# Patient Record
Sex: Male | Born: 1970
Health system: Southern US, Community
[De-identification: ages and names within clinical notes are randomized; demographics above are authoritative.]

## PROBLEM LIST (undated history)

## (undated) DIAGNOSIS — I10 Essential (primary) hypertension: Secondary | ICD-10-CM

## (undated) DIAGNOSIS — E785 Hyperlipidemia, unspecified: Secondary | ICD-10-CM

## (undated) DIAGNOSIS — E291 Testicular hypofunction: Secondary | ICD-10-CM

## (undated) DIAGNOSIS — G4733 Obstructive sleep apnea (adult) (pediatric): Principal | ICD-10-CM

## (undated) DIAGNOSIS — B019 Varicella without complication: Secondary | ICD-10-CM

## (undated) DIAGNOSIS — R739 Hyperglycemia, unspecified: Secondary | ICD-10-CM

## (undated) DIAGNOSIS — T63441A Toxic effect of venom of bees, accidental (unintentional), initial encounter: Secondary | ICD-10-CM

## (undated) HISTORY — PX: HAND SURGERY: SHX662

## (undated) HISTORY — DX: Testicular hypofunction: E29.1

## (undated) HISTORY — DX: Hyperlipidemia, unspecified: E78.5

## (undated) HISTORY — DX: Essential (primary) hypertension: I10

## (undated) HISTORY — DX: Toxic effect of venom of bees, accidental (unintentional), initial encounter: T63.441A

## (undated) HISTORY — DX: Varicella without complication: B01.9

## (undated) HISTORY — DX: Hyperglycemia, unspecified: R73.9

## (undated) HISTORY — DX: Obstructive sleep apnea (adult) (pediatric): G47.33

---

## 2006-12-19 ENCOUNTER — Ambulatory Visit: Payer: Self-pay | Admitting: Internal Medicine

## 2006-12-19 DIAGNOSIS — I428 Other cardiomyopathies: Secondary | ICD-10-CM | POA: Insufficient documentation

## 2006-12-20 ENCOUNTER — Encounter: Payer: Self-pay | Admitting: Internal Medicine

## 2006-12-20 DIAGNOSIS — E785 Hyperlipidemia, unspecified: Secondary | ICD-10-CM | POA: Insufficient documentation

## 2006-12-20 DIAGNOSIS — I10 Essential (primary) hypertension: Secondary | ICD-10-CM | POA: Insufficient documentation

## 2006-12-20 DIAGNOSIS — M109 Gout, unspecified: Secondary | ICD-10-CM | POA: Insufficient documentation

## 2006-12-23 ENCOUNTER — Ambulatory Visit: Payer: Self-pay | Admitting: Internal Medicine

## 2006-12-23 LAB — CONVERTED CEMR LAB
ALT: 30 units/L (ref 0–53)
AST: 33 units/L (ref 0–37)
Albumin: 4 g/dL (ref 3.5–5.2)
Alkaline Phosphatase: 56 units/L (ref 39–117)
BUN: 11 mg/dL (ref 6–23)
Basophils Absolute: 0 10*3/uL (ref 0.0–0.1)
Basophils Relative: 0.5 % (ref 0.0–1.0)
Bilirubin, Direct: 0.1 mg/dL (ref 0.0–0.3)
CO2: 27 meq/L (ref 19–32)
Calcium: 9.1 mg/dL (ref 8.4–10.5)
Chloride: 108 meq/L (ref 96–112)
Cholesterol: 253 mg/dL (ref 0–200)
Creatinine, Ser: 1.1 mg/dL (ref 0.4–1.5)
Direct LDL: 193.1 mg/dL
Eosinophils Absolute: 0.1 10*3/uL (ref 0.0–0.6)
Eosinophils Relative: 2.2 % (ref 0.0–5.0)
GFR calc Af Amer: 97 mL/min
GFR calc non Af Amer: 81 mL/min
Glucose, Bld: 101 mg/dL — ABNORMAL HIGH (ref 70–99)
HCT: 43.1 % (ref 39.0–52.0)
HDL: 43.5 mg/dL (ref >39.0)
Hemoglobin: 14.3 g/dL (ref 13.0–17.0)
Lymphocytes Relative: 40.2 % (ref 12.0–46.0)
MCHC: 33.3 g/dL (ref 30.0–36.0)
MCV: 80.9 fL (ref 78.0–100.0)
Monocytes Absolute: 0.3 10*3/uL (ref 0.2–0.7)
Monocytes Relative: 5.6 % (ref 3.0–11.0)
Neutro Abs: 2.6 10*3/uL (ref 1.4–7.7)
Neutrophils Relative %: 51.5 % (ref 43.0–77.0)
Platelets: 224 10*3/uL (ref 150–400)
Potassium: 3.6 meq/L (ref 3.5–5.1)
RBC: 5.32 M/uL (ref 4.22–5.81)
RDW: 13.6 % (ref 11.5–14.6)
Sodium: 142 meq/L (ref 135–145)
TSH: 1.08 microintl units/mL (ref 0.35–5.50)
Total Bilirubin: 0.5 mg/dL (ref 0.3–1.2)
Total CHOL/HDL Ratio: 5.8
Total Protein: 7.6 g/dL (ref 6.0–8.3)
Triglycerides: 92 mg/dL (ref 0–149)
Uric Acid, Serum: 5.1 mg/dL (ref 2.4–7.0)
VLDL: 18 mg/dL (ref 0–40)
WBC: 5.1 10*3/uL (ref 4.5–10.5)

## 2007-04-15 ENCOUNTER — Ambulatory Visit: Payer: Self-pay | Admitting: Internal Medicine

## 2007-04-15 DIAGNOSIS — F528 Other sexual dysfunction not due to a substance or known physiological condition: Secondary | ICD-10-CM

## 2007-04-15 DIAGNOSIS — J309 Allergic rhinitis, unspecified: Secondary | ICD-10-CM | POA: Insufficient documentation

## 2007-06-05 ENCOUNTER — Ambulatory Visit: Payer: Self-pay | Admitting: Internal Medicine

## 2007-06-05 LAB — CONVERTED CEMR LAB
ALT: 25 units/L (ref 0–53)
AST: 32 units/L (ref 0–37)
Cholesterol: 208 mg/dL (ref 0–200)
Direct LDL: 150.7 mg/dL
HDL: 42.9 mg/dL (ref >39.0)
Total CHOL/HDL Ratio: 4.8
Triglycerides: 96 mg/dL (ref 0–149)
VLDL: 19 mg/dL (ref 0–40)

## 2007-06-09 ENCOUNTER — Encounter: Payer: Self-pay | Admitting: Internal Medicine

## 2007-12-31 ENCOUNTER — Ambulatory Visit: Payer: Self-pay | Admitting: Internal Medicine

## 2007-12-31 DIAGNOSIS — D179 Benign lipomatous neoplasm, unspecified: Secondary | ICD-10-CM | POA: Insufficient documentation

## 2008-03-14 ENCOUNTER — Encounter: Payer: Self-pay | Admitting: Internal Medicine

## 2008-04-21 ENCOUNTER — Encounter: Admission: RE | Admit: 2008-04-21 | Discharge: 2008-04-21 | Payer: Self-pay | Admitting: Orthopedic Surgery

## 2008-04-27 ENCOUNTER — Encounter: Payer: Self-pay | Admitting: Internal Medicine

## 2008-05-16 ENCOUNTER — Encounter: Payer: Self-pay | Admitting: Internal Medicine

## 2008-06-06 ENCOUNTER — Telehealth: Payer: Self-pay | Admitting: Internal Medicine

## 2008-06-23 ENCOUNTER — Encounter: Payer: Self-pay | Admitting: Internal Medicine

## 2008-09-26 ENCOUNTER — Encounter: Payer: Self-pay | Admitting: Internal Medicine

## 2008-09-29 ENCOUNTER — Telehealth: Payer: Self-pay | Admitting: Internal Medicine

## 2008-10-20 ENCOUNTER — Ambulatory Visit (HOSPITAL_COMMUNITY): Admission: RE | Admit: 2008-10-20 | Discharge: 2008-10-20 | Payer: Self-pay | Admitting: Orthopedic Surgery

## 2008-10-24 ENCOUNTER — Encounter: Payer: Self-pay | Admitting: Internal Medicine

## 2008-11-09 ENCOUNTER — Ambulatory Visit (HOSPITAL_BASED_OUTPATIENT_CLINIC_OR_DEPARTMENT_OTHER): Admission: RE | Admit: 2008-11-09 | Discharge: 2008-11-09 | Payer: Self-pay | Admitting: Orthopedic Surgery

## 2008-11-17 ENCOUNTER — Encounter: Payer: Self-pay | Admitting: Internal Medicine

## 2008-11-24 ENCOUNTER — Encounter: Payer: Self-pay | Admitting: Internal Medicine

## 2008-12-22 ENCOUNTER — Encounter: Payer: Self-pay | Admitting: Internal Medicine

## 2009-01-12 ENCOUNTER — Encounter: Payer: Self-pay | Admitting: Internal Medicine

## 2009-02-02 ENCOUNTER — Encounter: Payer: Self-pay | Admitting: Internal Medicine

## 2009-02-28 ENCOUNTER — Ambulatory Visit: Payer: Self-pay | Admitting: Internal Medicine

## 2009-02-28 ENCOUNTER — Encounter (INDEPENDENT_AMBULATORY_CARE_PROVIDER_SITE_OTHER): Payer: Self-pay | Admitting: *Deleted

## 2009-02-28 DIAGNOSIS — L259 Unspecified contact dermatitis, unspecified cause: Secondary | ICD-10-CM

## 2009-04-17 ENCOUNTER — Emergency Department (HOSPITAL_COMMUNITY): Admission: EM | Admit: 2009-04-17 | Discharge: 2009-04-17 | Payer: Self-pay | Admitting: Emergency Medicine

## 2009-04-27 ENCOUNTER — Ambulatory Visit: Payer: Self-pay | Admitting: Internal Medicine

## 2009-04-27 LAB — CONVERTED CEMR LAB
ALT: 25 units/L (ref 0–53)
CO2: 28 meq/L (ref 19–32)
Calcium: 8.9 mg/dL (ref 8.4–10.5)
HDL: 50.2 mg/dL (ref >39.00)
Hgb A1c MFr Bld: 6.1 % (ref 4.6–6.5)
Sodium: 143 meq/L (ref 135–145)
TSH: 1.16 microintl units/mL (ref 0.35–5.50)
Total Bilirubin: 0.5 mg/dL (ref 0.3–1.2)
Total Protein: 7.6 g/dL (ref 6.0–8.3)
Triglycerides: 111 mg/dL (ref 0.0–149.0)

## 2009-05-04 ENCOUNTER — Ambulatory Visit: Payer: Self-pay | Admitting: Internal Medicine

## 2009-05-04 DIAGNOSIS — R7309 Other abnormal glucose: Secondary | ICD-10-CM

## 2009-07-17 ENCOUNTER — Ambulatory Visit: Payer: Self-pay | Admitting: Family

## 2009-07-17 ENCOUNTER — Encounter: Payer: Self-pay | Admitting: Family

## 2009-07-17 DIAGNOSIS — H6690 Otitis media, unspecified, unspecified ear: Secondary | ICD-10-CM | POA: Insufficient documentation

## 2009-07-20 ENCOUNTER — Encounter: Payer: Self-pay | Admitting: Internal Medicine

## 2009-09-19 ENCOUNTER — Telehealth: Payer: Self-pay | Admitting: Internal Medicine

## 2009-11-02 ENCOUNTER — Ambulatory Visit: Payer: Self-pay | Admitting: Internal Medicine

## 2009-11-02 LAB — CONVERTED CEMR LAB
Direct LDL: 175.7 mg/dL
Testosterone: 240.15 ng/dL — ABNORMAL LOW (ref 350.00–890.00)
Triglycerides: 102 mg/dL (ref 0.0–149.0)

## 2009-11-03 ENCOUNTER — Encounter: Payer: Self-pay | Admitting: Internal Medicine

## 2009-11-03 LAB — CONVERTED CEMR LAB
Sex Hormone Binding: 25 nmol/L (ref 13–71)
Testosterone Free: 56.8 pg/mL (ref 47.0–244.0)
Testosterone: 251.89 ng/dL — ABNORMAL LOW (ref 350–890)

## 2009-11-09 ENCOUNTER — Ambulatory Visit: Payer: Self-pay | Admitting: Internal Medicine

## 2009-11-09 DIAGNOSIS — E291 Testicular hypofunction: Secondary | ICD-10-CM

## 2009-11-13 ENCOUNTER — Encounter (INDEPENDENT_AMBULATORY_CARE_PROVIDER_SITE_OTHER): Payer: Self-pay | Admitting: *Deleted

## 2010-03-16 ENCOUNTER — Other Ambulatory Visit: Payer: Self-pay | Admitting: Internal Medicine

## 2010-03-16 ENCOUNTER — Ambulatory Visit
Admission: RE | Admit: 2010-03-16 | Discharge: 2010-03-16 | Payer: Self-pay | Source: Home / Self Care | Attending: Internal Medicine | Admitting: Internal Medicine

## 2010-03-16 LAB — HEPATIC FUNCTION PANEL
ALT: 26 U/L (ref 0–53)
AST: 30 U/L (ref 0–37)
Albumin: 3.9 g/dL (ref 3.5–5.2)
Alkaline Phosphatase: 55 U/L (ref 39–117)
Bilirubin, Direct: 0.1 mg/dL (ref 0.0–0.3)
Total Bilirubin: 0.7 mg/dL (ref 0.3–1.2)
Total Protein: 7.2 g/dL (ref 6.0–8.3)

## 2010-03-16 LAB — LIPID PANEL
Cholesterol: 154 mg/dL (ref 0–200)
HDL: 46.8 mg/dL (ref >39.00)
LDL Cholesterol: 95 mg/dL (ref 0–99)
Total CHOL/HDL Ratio: 3
Triglycerides: 61 mg/dL (ref 0.0–149.0)
VLDL: 12.2 mg/dL (ref 0.0–40.0)

## 2010-03-16 LAB — HEMOGLOBIN A1C: Hgb A1c MFr Bld: 6.5 % (ref 4.6–6.5)

## 2010-03-23 ENCOUNTER — Ambulatory Visit
Admission: RE | Admit: 2010-03-23 | Discharge: 2010-03-23 | Payer: Self-pay | Source: Home / Self Care | Attending: Internal Medicine | Admitting: Internal Medicine

## 2010-04-05 NOTE — Assessment & Plan Note (Signed)
Summary: 6 month follow up/mhf   Vital Signs:  Patient profile:   40 year old male Height:      69 inches Weight:      218.50 pounds BMI:     32.38 O2 Sat:      98 % on Room air Temp:     98.3 degrees F oral Pulse rate:   93 / minute Pulse rhythm:   regular Resp:     18 per minute BP sitting:   136 / 90  (right arm) Cuff size:   large  Vitals Entered By: [name] CMA (November 09, 2009 3:38 PM)  O2 Flow:  Room air CC: 6 month follow up  Is Patient Diabetic? No Pain Assessment Patient in pain? no      Comments no concerns   Primary Care [name]:  DThomos Lemons DO  CC:  6 month follow up .  History of Present Illness: 40 y/o AA male with hx of htn and ED for f/u still having intemittent ED reviewed recent testosterone blood test he denies headaches  Testosterone, Total  [L]  251.89 ng/dL                [id number]                Tanner Stage       Male              Male              I              < 30 ng/dL        < 10 ng/dL              II             < 150 ng/dL       < 30 ng/dL              III            100-320 ng/dL     < 35 ng/dL              IV             200-970 ng/dL     95-62 ng/dL              V              350-890 ng/dL     13-08 ng/dL         Sex Hormone Binding Globulin                             25 nmol/L                   13-71   Testosterone, Free        56.8 pg/mL                  47.0-244.0           The concentration of free testosterone is derived from a mathematical     expression based on constants for the binding of testosterone to sex     hormone-binding globulin and albumin.         Testosterone, % Free      2.3 %  1.6-2.9  Preventive Screening-Counseling & Management  Alcohol-Tobacco     Smoking Status: never  Allergies (verified): No Known Drug Allergies  Past History:  Past Medical History: Hyperlipidemia Hypertension  Cardiomyopathy Gout      Family History: Family History of CAD  Male 1st degree relative <60 Family History Diabetes 1st degree relative Family History High cholesterol Family History Hypertension       Social History: Married - 3 children Never Smoked  Alcohol use-yes  (social)  Occupation:  works for [employer]    Physical Exam  General:  alert, well-developed, and well-nourished.   Lungs:  Normal respiratory effort, chest expands symmetrically. Lungs are clear to auscultation, no crackles or wheezes. Heart:  Normal rate and regular rhythm. S1 and S2 normal without gallop, murmur, click, rub or other extra sounds. Extremities:  No lower extremity edema    Impression & Recommendations:  Problem # 1:  HYPOGONADISM (ICD-257.2) Pt with ED and low testosterone level.  refer to endo for further work up and possible tx  Orders: Endocrinology Referral (Endocrine)  Problem # 2:  HYPERTENSION (ICD-401.9) Assessment: Improved  His updated medication list for this problem includes:    Lisinopril 10 Mg Tabs (Lisinopril) ..... One by mouth qd    Amlodipine Besylate 10 Mg Tabs (Amlodipine besylate) ..... One tablet by mouth daily  BP today: 136/90 Prior BP: 150/100 (07/17/2009)  Labs Reviewed: K+: 3.8 (04/27/2009) Creat: : 1.2 (04/27/2009)   Chol: 235 (11/02/2009)   HDL: 47.80 (11/02/2009)   LDL: DEL (06/05/2007)   TG: 102.0 (11/02/2009)  Complete Medication List: 1)  Lisinopril 10 Mg Tabs (Lisinopril) .... One by mouth qd 2)  Amlodipine Besylate 10 Mg Tabs (Amlodipine besylate) .... One tablet by mouth daily 3)  Aspir-low 81 Mg Tbec (Aspirin) .... One by mouth once daily 4)  Flonase 50 Mcg/act Susp (Fluticasone propionate) .... One spray each nostril once daily 5)  Triamcinolone Acetonide 0.1 % Crea (Triamcinolone acetonide) .... Apply two times a day 6)  Crestor 20 Mg Tabs (Rosuvastatin calcium) .... One by mouth once daily  Patient Instructions: 1)  Please schedule a follow-up appointment in 4 months. 2)  Hepatic Panel prior  to visit, ICD-9:  272.4  3)  Lipid Panel prior to visit, ICD-9: 272.4 4)  HbgA1C prior to visit, ICD-9: 790.29 5)  Please return for lab work one (1) week before your next appointment.   Contraindications/Deferment of Procedures/Staging:    Test/Procedure: FLU VAX    Reason for deferment: patient declined  Prescriptions: CRESTOR 20 MG TABS (ROSUVASTATIN CALCIUM) one by mouth once daily  #30 x 5   Entered and Authorized by:   D. Thomos Lemons DO   Signed by:   D. Thomos Lemons DO on 11/09/2009   Method used:   Electronically to        [facility]  [address]* (retail)       [address]       [place], [place]  [address]       Ph: [phone]       Fax: [phone]   RxID:   [id number]   Current Allergies (reviewed today): No known allergies    Immunization History:  Influenza Immunization History:    Influenza:  declined (11/09/2009)

## 2010-04-05 NOTE — Letter (Signed)
Summary: Work Dietitian at [employer]. [address]   [place], [place] [address]   Phone: [phone]  Fax: [phone]      Today's Date: May 04, 2009    Name of Patient: Bruce Harris  The above named patient had a medical visit today at:  am / pm.  Please take this into consideration when reviewing the time away from work.    Special Instructions:  [ X ] None  [  ] To be off the remainder of today, returning to the normal work / school schedule tomorrow.  [  ] To be off until the next scheduled appointment on ______________________.  [  ] Other ________________________________________________________________ ________________________________________________________________________      Sincerely yours,   [name] CMA Dr Thomos Lemons

## 2010-04-05 NOTE — Letter (Signed)
Summary: Primary Care Consult Scheduled Letter  [place] [facility]  [address]   [place], [place] [address]   Phone: [phone]  Fax: [phone]      11/13/2009 MRN: [id number]  [facility] [address] [place], [place]  [address]    Dear Mr. Delima,      We have scheduled an appointment for you.  At the recommendation of Dr.YOO, we have scheduled you a consult with DR [facility]   on OCTOBER  26,2011 at [facility] .  Their address [address] [place]   [address] . The office phone number is _[phone].  If this appointment day and time is not convenient for you, please feel free to call the office of the doctor you are being referred to at the number listed above and reschedule the appointment.     It is important for you to keep your scheduled appointments. We are here to make sure you are given good patient care.    Thank you,  Darral Dash Patient Care Coordinator [place] at [facility]

## 2010-04-05 NOTE — Letter (Signed)
Summary: [facility]   Imported By: Lanelle Bal 08/04/2009 08:14:50  _____________________________________________________________________  External Attachment:    Type:   Image     Comment:   External Document

## 2010-04-05 NOTE — Assessment & Plan Note (Signed)
Summary: 2 month follow up/mhf, resched- jr   Vital Signs:  Patient profile:   40 year old male Weight:      227.75 pounds BMI:     33.75 O2 Sat:      97 % on Room air Temp:     98.4 degrees F oral Pulse rate:   90 / minute Pulse rhythm:   regular Resp:     18 per minute BP sitting:   126 / 90  (left arm) Cuff size:   large  Vitals Entered By: [name] CMA (May 04, 2009 3:39 PM)  O2 Flow:  Room air CC: 2 Month Follow up disease management Is Patient Diabetic? No Comments review of labs, no concerns   Primary Care Provider:  D. Thomos Lemons DO  CC:  2 Month Follow up disease management.  History of Present Illness:  Hyperlipidemia Follow-Up      This is a 40 year old man who presents for Hyperlipidemia follow-up.  The patient denies muscle aches and GI upset.  The patient denies the following symptoms: chest pain/pressure.  Compliance with medications (by patient report) has been near 100%.  Dietary compliance has been poor.    Htn - stable lab results reviewed.  Preventive Screening-Counseling & Management  Alcohol-Tobacco     Smoking Status: never  Allergies: No Known Drug Allergies  Past History:  Past Medical History: Hyperlipidemia Hypertension  Cardiomyopathy Gout    Family History: Family History of CAD Male 1st degree relative <60 Family History Diabetes 1st degree relative Family History High cholesterol Family History Hypertension      Social History: Married - 3 children Never Smoked  Alcohol use-yes  (social) Occupation:  works for [employer]    Physical Exam  General:  alert and overweight-appearing.   Eyes:  pupils equal, pupils round, and pupils reactive to light.   Neck:  No deformities, masses, or tenderness noted.no carotid bruits.   Lungs:  normal respiratory effort and normal breath sounds.   Heart:  normal rate, regular rhythm, and no gallop.   Abdomen:  soft and non-tender.   Extremities:  No lower extremity  edema    Impression & Recommendations:  Problem # 1:  HYPERLIPIDEMIA (ICD-272.4) Poor dietary compliance.   we discussed decreasing/avoiding saturated fats.  His updated medication list for this problem includes:    Simvastatin 40 Mg Tabs (Simvastatin) ..... One by mouth qpm  Labs Reviewed: SGOT: 28 (04/27/2009)   SGPT: 25 (04/27/2009)   HDL:50.20 (04/27/2009), 42.9 (06/05/2007)  LDL:DEL (06/05/2007), DEL (12/23/2006)  Chol:218 (04/27/2009), 208 (06/05/2007)  Trig:111.0 (04/27/2009), 96 (06/05/2007)  Problem # 2:  HYPERTENSION (ICD-401.9) Assessment: Improved Good response.  Maintain current medication regimen.  His updated medication list for this problem includes:    Lisinopril 10 Mg Tabs (Lisinopril) ..... One by mouth qd    Amlodipine Besylate 5 Mg Tabs (Amlodipine besylate) ..... One by mouth once daily  BP today: 126/90 Prior BP: 150/108 (02/28/2009)  Labs Reviewed: K+: 3.8 (04/27/2009) Creat: : 1.2 (04/27/2009)   Chol: 218 (04/27/2009)   HDL: 50.20 (04/27/2009)   LDL: DEL (06/05/2007)   TG: 111.0 (04/27/2009)  Problem # 3:  HYPERGLYCEMIA (ICD-790.29) dietary / lifestyle changes reviewed Labs Reviewed: Creat: 1.2 (04/27/2009)     Complete Medication List: 1)  Lisinopril 10 Mg Tabs (Lisinopril) .... One by mouth qd 2)  Amlodipine Besylate 5 Mg Tabs (Amlodipine besylate) .... One by mouth once daily 3)  Aspir-low 81 Mg Tbec (Aspirin) .Marland KitchenMarland KitchenMarland Kitchen  One by mouth once daily 4)  Simvastatin 40 Mg Tabs (Simvastatin) .... One by mouth qpm 5)  Viagra 100 Mg Tabs (Sildenafil citrate) .... 1/2 to one tab by mouth once daily as needed 6)  Flonase 50 Mcg/act Susp (Fluticasone propionate) .... One spray each nostril once daily 7)  Triamcinolone Acetonide 0.1 % Crea (Triamcinolone acetonide) .... Apply two times a day  Patient Instructions: 1)  Please schedule a follow-up appointment in 6 months. 2)  Lipid Panel prior to visit, ICD-9: 272.4 3)  Please return for lab work one (1) week  before your next appointment.  Prescriptions: SIMVASTATIN 40 MG TABS (SIMVASTATIN) one by mouth qpm  #90 x 1   Entered and Authorized by:   D. Thomos Lemons DO   Signed by:   D. Thomos Lemons DO on 05/04/2009   Method used:   Electronically to        [facility]  [address]* (retail)       [address]       [place], [place]  [address]       Ph: [phone]       Fax: [phone]   RxID:   [id number] AMLODIPINE BESYLATE 5 MG  TABS (AMLODIPINE BESYLATE) one by mouth once daily  #90 x 1   Entered and Authorized by:   D. Thomos Lemons DO   Signed by:   D. Thomos Lemons DO on 05/04/2009   Method used:   Electronically to        [facility]  [address]* (retail)       [address]       [place], [place]  [address]       Ph: [phone]       Fax: [phone]   RxID:   [id number] LISINOPRIL 10 MG  TABS (LISINOPRIL) one by mouth qd  #90 x 1   Entered and Authorized by:   D. Thomos Lemons DO   Signed by:   D. Thomos Lemons DO on 05/04/2009   Method used:   Electronically to        [facility]  [address]* (retail)       [address]       [place], [place]  [address]       Ph: [phone]       Fax: [phone]   RxID:   [id number]    Immunization History:  Influenza Immunization History:    Influenza:  declined (05/04/2009)

## 2010-04-05 NOTE — Progress Notes (Signed)
Summary: Lab orders  Phone Note Call from Patient Call back at Home Phone [phone]   Caller: Patient Summary of Call: Pt requesting testorone added to his lab orders  Initial call taken by: Lannette Donath,  September 19, 2009 4:45 PM  Follow-up for Phone Call        ok to add total testosterone - use ED code Follow-up by: D. Thomos Lemons DO,  September 20, 2009 12:20 PM  Additional Follow-up for Phone Call Additional follow up Details #1::        call returned to patient. He was advised test will be added to lab draw Additional Follow-up by: [name] CMA,  September 21, 2009 11:38 AM

## 2010-04-05 NOTE — Assessment & Plan Note (Signed)
Summary: HA/EAR/BALANCE PROBLEM/HEA   Vital Signs:  Patient profile:   40 year old male Height:      69 inches Weight:      219 pounds BMI:     32.46 Temp:     98.4 degrees F oral Pulse rate:   96 / minute Pulse rhythm:   regular Resp:     16 per minute BP sitting:   150 / 100  (right arm) Cuff size:   large  Vitals Entered By: Bruce Harris CMA (Jul 17, 2009 3:07 PM) CC: room 5  Bilateral ear pain, head pressure, feels off balance, blurry vision intermittent x 2 weeks but now increasing. Is Patient Diabetic? No   Primary Care Provider:  Dondra Spry DO  CC:  room 5  Bilateral ear pain, head pressure, feels off balance, and blurry vision intermittent x 2 weeks but now increasing.Marland Kitchen  History of Present Illness: Bruce Harris is a 40 year old male who presents today with complaint of 2 week history of bilatera; ear pain/pressure.  "Feels like a vice grip".  this is associated with balance problems and occasionally a some blurred vision when pain is severe.  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     Allergies (verified): No Known Drug Allergies  Physical Exam  General:  Well-developed,well-nourished,in no acute distress; alert,appropriate and cooperative throughout examination Head:  Normocephalic and atraumatic without obvious abnormalities. No apparent alopecia or balding. Eyes:  PERRLA Ears:  Right TM is erythematous and bulging,  L TM suspect serous effusion, but no erythema and no bulging.   Mouth:  Oral mucosa and oropharynx without lesions or exudates.  Teeth in good repair. Neck:  Thick, no notable masses of Lymphadenopathy Lungs:  Normal respiratory effort, chest expands symmetrically. Lungs are clear to auscultation, no crackles or wheezes. Heart:  Normal rate and regular rhythm. S1 and S2 normal without gallop, murmur, click, rub or other extra sounds.   Impression & Recommendations:  Problem # 1:  OTITIS MEDIA, RIGHT  (ICD-382.9) Assessment New Will treat with amoxicillin.  This is likely cause for dizziness.  Pt given note for work excusing him for the next few days.  Pt instructed to follow up if symtoms worsen or do not improve. His updated medication list for this problem includes:    Aspir-low 81 Mg Tbec (Aspirin) ..... One by mouth once daily    Amoxicillin 500 Mg Cap (Amoxicillin) .Marland Kitchen... Take 1 capsule by mouth three times a day x 10 days  Problem # 2:  HYPERTENSION (ICD-401.9) BP is 150/100.  Pt reports compliance with meds.  Will increase amlodipine to 10mg .  Patient instructed to follow up in 1 month with Dr. Artist Pais.  Verbalizes understanding. His updated medication list for this problem includes:    Lisinopril 10 Mg Tabs (Lisinopril) ..... One by mouth qd    Amlodipine Besylate 10 Mg Tabs (Amlodipine besylate) ..... One tablet by mouth daily  BP today: 150/100 Prior BP: 126/90 (05/04/2009)  Labs Reviewed: K+: 3.8 (04/27/2009) Creat: : 1.2 (04/27/2009)   Chol: 218 (04/27/2009)   HDL: 50.20 (04/27/2009)   LDL: DEL (06/05/2007)   TG: 111.0 (04/27/2009)  Complete Medication List: 1)  Lisinopril 10 Mg Tabs (Lisinopril) .... One by mouth qd 2)  Amlodipine Besylate 10 Mg Tabs (Amlodipine besylate) .... One tablet by mouth daily 3)  Aspir-low 81 Mg Tbec (Aspirin) .... One by mouth once daily 4)  Simvastatin 40 Mg Tabs (Simvastatin) .... One by mouth  qpm 5)  Flonase 50 Mcg/act Susp (Fluticasone propionate) .... One spray each nostril once daily 6)  Triamcinolone Acetonide 0.1 % Crea (Triamcinolone acetonide) .... Apply two times a day 7)  Amoxicillin 500 Mg Cap (Amoxicillin) .... Take 1 capsule by mouth three times a day x 10 days  Patient Instructions: 1)  Call if symptoms worsen, or do not improve.  Prescriptions: AMLODIPINE BESYLATE 10 MG TABS (AMLODIPINE BESYLATE) one tablet by mouth daily  #30 x 1   Entered and Authorized by:   Lemont Fillers FNP   Signed by:   Lemont Fillers FNP  on 07/17/2009   Method used:   Electronically to        [facility] [phone]* (retail)       [address]       [place], [place]  [address]       Ph: [phone]       Fax: [phone]   RxID:   [id number] AMOXICILLIN 500 MG CAP (AMOXICILLIN) Take 1 capsule by mouth three times a day X 10 days  #30 x 0   Entered and Authorized by:   Lemont Fillers FNP   Signed by:   Lemont Fillers FNP on 07/17/2009   Method used:   Electronically to        [facility] [phone]* (retail)       [address]       [place], [place]  [address]       Ph: [phone]       Fax: [phone]   RxID:   [id number]   Current Allergies (reviewed today): No known allergies

## 2010-04-05 NOTE — Letter (Signed)
Summary: Out of Work  Adult nurse at [employer]. [address]   [place], [place] [address]   Phone: [phone]  Fax: [phone]    Jul 17, 2009   Employee:  Allen R Seever    To Whom It May Concern:   For Medical reasons, please excuse the above named employee from work for the following dates:  Start:   07/17/09  End:   07/20/09  If you need additional information, please feel free to contact our office.         Sincerely,    Lemont Fillers FNP

## 2010-04-11 NOTE — Assessment & Plan Note (Signed)
Summary: 4 MONTH FOLLOW UP/MHF   Vital Signs:  Patient profile:   [name] Height:      69 inches Weight:      220.50 pounds BMI:     32.68 O2 Sat:      99 % on Room air Temp:     98.2 degrees F oral Pulse rate:   78 / minute Resp:     18 per minute BP sitting:   124 / 80  (right arm) Cuff size:   large  Vitals Entered By: [name] CMA (March 23, 2010 10:02 AM)  O2 Flow:  Room air CC: 4 Month Follow up  Is Patient Diabetic? No Pain Assessment Patient in pain? no        Primary Care Provider:  Dondra Spry DO  CC:  4 Month Follow up .  History of Present Illness: 40 y/o Harris for f/u pt unable to obtain appt with endo re:  hypogonadism  however,  he reports fatigue and ED symptoms improved  htn - tolerating meds well  hyperlipidemia - no adverse effects from crestor noted   Preventive Screening-Counseling & Management  Alcohol-Tobacco     Smoking Status: never  Allergies (verified): No Known Drug Allergies  Past History:  Past Medical History: Hyperlipidemia Hypertension  Cardiomyopathy  Gout      Social History: Married - 3 children Never Smoked  Alcohol use-yes  (social)   Occupation:  works for [employer]    Review of Systems  The patient denies chest pain.         no dizziness  Physical Exam  General:  alert, well-developed, and well-nourished.   Head:  normocephalic and atraumatic.   Lungs:  normal respiratory effort and normal breath sounds.   Heart:  normal rate, regular rhythm, no murmur, and no gallop.   Extremities:  No lower extremity edema    Impression & Recommendations:  Problem # 1:  ERECTILE DYSFUNCTION (ICD-302.72) Assessment Improved  Problem # 2:  HYPERTENSION (ICD-401.9) Assessment: Improved  His updated medication list for this problem includes:    Lisinopril 10 Mg Tabs (Lisinopril) ..... One by mouth qd    Amlodipine Besylate 10 Mg Tabs (Amlodipine besylate) ..... One tablet by  mouth daily  Future Orders: T-Basic Metabolic Panel [id number]) ... 06/18/2010  BP today: 124/80 Prior BP: 136/90 (11/09/2009)  Labs Reviewed: K+: 3.8 (04/27/2009) Creat: : 1.2 (04/27/2009)   Chol: 154 (03/16/2010)   HDL: 46.80 (03/16/2010)   LDL: 95 (03/16/2010)   TG: 61.0 (03/16/2010)  Problem # 3:  HYPERLIPIDEMIA (ICD-272.4) Assessment: Improved  His updated medication list for this problem includes:    Crestor 20 Mg Tabs (Rosuvastatin calcium) ..... One by mouth once daily  Labs Reviewed: SGOT: 30 (03/16/2010)   SGPT: 26 (03/16/2010)   HDL:46.80 (03/16/2010), 47.80 (11/02/2009)  LDL:95 (03/16/2010), DEL (82/95/6213)  Chol:154 (03/16/2010), 235 (11/02/2009)  Trig:61.0 (03/16/2010), 102.0 (11/02/2009)  Complete Medication List: 1)  Lisinopril 10 Mg Tabs (Lisinopril) .... One by mouth qd 2)  Amlodipine Besylate 10 Mg Tabs (Amlodipine besylate) .... One tablet by mouth daily 3)  Aspir-low 81 Mg Tbec (Aspirin) .... One by mouth once daily 4)  Flonase 50 Mcg/act Susp (Fluticasone propionate) .... One spray each nostril once daily 5)  Triamcinolone Acetonide 0.1 % Crea (Triamcinolone acetonide) .... Apply two times a day 6)  Crestor 20 Mg Tabs (Rosuvastatin calcium) .... One by mouth once daily  Other Orders: Future Orders: T- Hemoglobin A1C ([id number]) .Marland KitchenMarland Kitchen  06/18/2010  Patient Instructions: 1)  Please schedule a follow-up appointment in 4 months. 2)  BMP prior to visit, ICD-9:  790.29 3)  HbgA1C prior to visit, ICD-9:  790.29 4)  Please return for lab work one (1) week before your next appointment.  Prescriptions: CRESTOR 20 MG TABS (ROSUVASTATIN CALCIUM) one by mouth once daily  #90 x 1   Entered and Authorized by:   D. Thomos Lemons DO   Signed by:   D. Thomos Lemons DO on 03/23/2010   Method used:   Print then Give to Patient   RxID:   [id number] AMLODIPINE BESYLATE 10 MG TABS (AMLODIPINE BESYLATE) one tablet by mouth daily  #90 x 1   Entered and Authorized by:   D.  Thomos Lemons DO   Signed by:   D. Thomos Lemons DO on 03/23/2010   Method used:   Print then Give to Patient   RxID:   [id number] LISINOPRIL 10 MG  TABS (LISINOPRIL) one by mouth qd  #90 x 1   Entered and Authorized by:   D. Thomos Lemons DO   Signed by:   D. Thomos Lemons DO on 03/23/2010   Method used:   Print then Give to Patient   RxID:   [id number]    Orders Added: 1)  T-Basic Metabolic Panel [[id number]] 2)  T- Hemoglobin A1C [[id number]] 3)  Est. Patient Level III [62376]     Current Allergies (reviewed today): No known allergies

## 2010-04-24 IMAGING — NM NM BONE 3 PHASE
1 series · 6 of 6 positions shown · non-contrast
Comparison: Right wrist joint injection 04/21/2008.

CLINICAL DATA: 38-year-old male with history of right scapho-lunate
injury 1 year ago.

NUCLEAR MEDICINE THREE PHASE BONE SCAN
TECHNIQUE: After intravenous administration of radiopharmeceutical,
immediate flow images were performed by immediate / early static
images and approximate three hour delayed static images.
Radiopharmaceutical: 23.5 mCi technetium 99m labeled MDP.

[Series 1: bone fl0w · 4.46mm/px · 6 of 40 frames shown]
[frame 4/40]
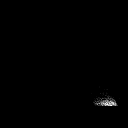
[frame 10/40]
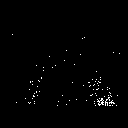
[frame 17/40]
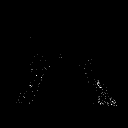
[frame 24/40]
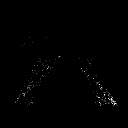
[frame 30/40]
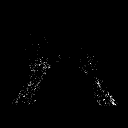
[frame 37/40]
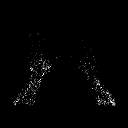

[6 of 6 positions shown; findings below may reference images not displayed]

FINDINGS: Dynamic phase blood flow images show symmetric activity
in the bilateral hand and wrist.

Delayed phase blood pool images show symmetric radiotracer activity
about both hands and wrists with no abnormal focus of increased
activity identified.

2 hour delayed phase images demonstrate subtle increased
radiotracer activity at the level of the radial aspect of the
radiocarpal joint (approximately the area of the right scaphoid).
Otherwise, osseous phase activity is within normal limits.
IMPRESSION: Subtle increased radiotracer activity at the radial aspect of the
radiocarpal joint seen only on the delayed bone phase images,
compatible with mild increased osseous remodelling.  Otherwise
negative three-phase study.

## 2010-06-08 LAB — BASIC METABOLIC PANEL
BUN: 12 mg/dL (ref 6–23)
CO2: 27 mEq/L (ref 19–32)
Chloride: 107 mEq/L (ref 96–112)
Creatinine, Ser: 1.11 mg/dL (ref 0.4–1.5)

## 2010-06-11 ENCOUNTER — Other Ambulatory Visit: Payer: Self-pay | Admitting: Internal Medicine

## 2010-06-11 DIAGNOSIS — R7309 Other abnormal glucose: Secondary | ICD-10-CM

## 2010-06-13 ENCOUNTER — Encounter: Payer: Self-pay | Admitting: Internal Medicine

## 2010-06-14 ENCOUNTER — Other Ambulatory Visit (INDEPENDENT_AMBULATORY_CARE_PROVIDER_SITE_OTHER): Payer: 59

## 2010-06-14 DIAGNOSIS — R7309 Other abnormal glucose: Secondary | ICD-10-CM

## 2010-06-14 LAB — BASIC METABOLIC PANEL
CO2: 27 mEq/L (ref 19–32)
Calcium: 9.1 mg/dL (ref 8.4–10.5)
Glucose, Bld: 91 mg/dL (ref 70–99)
Sodium: 138 mEq/L (ref 135–145)

## 2010-06-18 ENCOUNTER — Ambulatory Visit: Payer: Self-pay | Admitting: Internal Medicine

## 2010-06-18 ENCOUNTER — Ambulatory Visit (INDEPENDENT_AMBULATORY_CARE_PROVIDER_SITE_OTHER): Payer: 59 | Admitting: Internal Medicine

## 2010-06-18 ENCOUNTER — Encounter: Payer: Self-pay | Admitting: Internal Medicine

## 2010-06-18 DIAGNOSIS — E785 Hyperlipidemia, unspecified: Secondary | ICD-10-CM

## 2010-06-18 DIAGNOSIS — F528 Other sexual dysfunction not due to a substance or known physiological condition: Secondary | ICD-10-CM

## 2010-06-18 DIAGNOSIS — R7309 Other abnormal glucose: Secondary | ICD-10-CM

## 2010-06-18 DIAGNOSIS — I10 Essential (primary) hypertension: Secondary | ICD-10-CM

## 2010-06-18 MED ORDER — LISINOPRIL 10 MG PO TABS: 10.0000 mg | ORAL_TABLET | Freq: Every day | ORAL | Status: DC

## 2010-06-18 MED ORDER — ROSUVASTATIN CALCIUM 20 MG PO TABS: 20.0000 mg | ORAL_TABLET | Freq: Every day | ORAL | Status: DC

## 2010-06-18 MED ORDER — TADALAFIL 10 MG PO TABS: 10.0000 mg | ORAL_TABLET | ORAL | Status: DC | PRN

## 2010-06-18 MED ORDER — AMLODIPINE BESYLATE 10 MG PO TABS: 10.0000 mg | ORAL_TABLET | Freq: Every day | ORAL | Status: DC

## 2010-06-18 MED ORDER — FLUTICASONE PROPIONATE 50 MCG/ACT NA SUSP: 1.0000 | Freq: Every day | NASAL | Status: DC

## 2010-06-18 NOTE — Patient Instructions (Signed)
Complete blood work 2-3 days before your next appointment BMET, A1c - 790.29 Testosterone level - use hypogonadism code

## 2010-06-18 NOTE — Progress Notes (Signed)
  Subjective:    Patient ID: Bruce Harris, male    DOB: April 25, 1970, 40 y.o.   MRN: [id number]  Hypertension This is a chronic problem. The problem is unchanged. The problem is controlled. There are no associated agents to hypertension. Risk factors for coronary artery disease include male gender. Past treatments include ACE inhibitors and calcium channel blockers. The current treatment provides moderate improvement. There are no compliance problems.  There is no history of kidney disease or CAD/MI. There is no history of chronic renal disease.   Pt reports has made significant dietary changes.  Less fast food,  No soft drinks, more fruits and vegetables  ED - stable.  Good response to cialis   Review of Systems    no chest pain or shortness of breath Past Medical History  Diagnosis Date  . Hyperlipidemia   . Hypertension   . Cardiomyopathy   . Gout     History   Social History  . Marital Status: Married    Spouse Name: N/A    Number of Children: N/A  . Years of Education: N/A   Occupational History  . Not on file.   Social History Main Topics  . Smoking status: Never Smoker   . Smokeless tobacco: Not on file  . Alcohol Use: Not on file  . Drug Use: Not on file  . Sexually Active: Not on file   Other Topics Concern  . Not on file   Social History Narrative     Married - 3 children   Never Smoked   Alcohol use-yes  (social)   Occupation:  works for [employer]     Past Surgical History  Procedure Date  . No past surgeries     Family History  Problem Relation Age of Onset  . Coronary artery disease    . Diabetes    . Hyperlipidemia    . Hypertension      No Known Allergies  Current Outpatient Prescriptions on File Prior to Visit  Medication Sig Dispense Refill  . amLODipine (NORVASC) 10 MG tablet Take 10 mg by mouth daily.        Marland Kitchen aspirin 81 MG tablet Take 81 mg by mouth daily.        . fluticasone (FLONASE) 50 MCG/ACT nasal spray 1 spray by  Nasal route daily.        . rosuvastatin (CRESTOR) 20 MG tablet Take 20 mg by mouth daily.        Marland Kitchen triamcinolone (KENALOG) 0.1 % cream Apply topically 2 (two) times daily.          BP 116/80  Pulse 96  Temp(Src) 98.2 F (36.8 C) (Oral)  Resp 18  Ht 5\' 9"  (1.753 m)  Wt 221 lb (100.245 kg)  BMI 32.64 kg/m2  SpO2 97%    Objective:   Physical Exam  Constitutional: He appears well-developed and well-nourished.  Cardiovascular: Regular rhythm.   Pulmonary/Chest: Effort normal and breath sounds normal.  Musculoskeletal:       Trace lower ext edema  Skin: Skin is warm and dry.  Psychiatric: He has a normal mood and affect. His behavior is normal.          Assessment & Plan:

## 2010-06-24 NOTE — Assessment & Plan Note (Signed)
Good lifestyle changes.  I encouraged regular exercise.  We discussed adding metformin in A1c worsens   Lab Results  Component Value Date   HGBA1C 6.1 06/14/2010

## 2010-06-24 NOTE — Assessment & Plan Note (Signed)
BP is well controlled.  Continue current medication regimen.  BP: 116/80 mmHg   Lab Results  Component Value Date   CREATININE 1.2 06/14/2010

## 2010-06-24 NOTE — Assessment & Plan Note (Signed)
Good response to cialis.   Use as directed

## 2010-06-24 NOTE — Assessment & Plan Note (Signed)
Pt working towards additional wt loss Continue crestor. Lab Results  Component Value Date   CHOL 154 03/16/2010   CHOL 235* 11/02/2009   CHOL 218* 04/27/2009   Lab Results  Component Value Date   HDL 46.80 03/16/2010   HDL 60.45 11/02/2009   HDL 50.20 04/27/2009   Lab Results  Component Value Date   LDLCALC 95 03/16/2010   Lab Results  Component Value Date   TRIG 61.0 03/16/2010   TRIG 102.0 11/02/2009   TRIG 111.0 04/27/2009   Lab Results  Component Value Date   CHOLHDL 3 03/16/2010   CHOLHDL 5 11/02/2009   CHOLHDL 4 04/27/2009

## 2010-07-16 ENCOUNTER — Telehealth: Payer: Self-pay | Admitting: *Deleted

## 2010-07-16 NOTE — Telephone Encounter (Signed)
Patient called and left voice message requesting samples of Amlodipine, Lisinopril and Crestor. He was informed samples of Amlodipine and Lisinopril were not available, however samples of Crestor would be left at front desk for patient pick up.

## 2010-08-07 ENCOUNTER — Telehealth: Payer: Self-pay | Admitting: *Deleted

## 2010-08-07 NOTE — Telephone Encounter (Signed)
Spoke to pt and notified him that FMLA paperwork re: health condition of his wife had been completed and faxed to [facility] @ [place] [phone]. Originals sent to MR for scanning.

## 2010-09-18 ENCOUNTER — Ambulatory Visit (INDEPENDENT_AMBULATORY_CARE_PROVIDER_SITE_OTHER): Payer: 59 | Admitting: Internal Medicine

## 2010-09-18 ENCOUNTER — Encounter: Payer: Self-pay | Admitting: Internal Medicine

## 2010-09-18 VITALS — BP 122/86 | HR 89 | Ht 69.0 in | Wt 224.0 lb

## 2010-09-18 DIAGNOSIS — T63461A Toxic effect of venom of wasps, accidental (unintentional), initial encounter: Secondary | ICD-10-CM

## 2010-09-18 DIAGNOSIS — T6391XA Toxic effect of contact with unspecified venomous animal, accidental (unintentional), initial encounter: Secondary | ICD-10-CM

## 2010-09-18 DIAGNOSIS — Z91030 Bee allergy status: Secondary | ICD-10-CM

## 2010-09-18 MED ORDER — EPINEPHRINE 0.3 MG/0.3ML IJ DEVI: 0.3000 mg | Freq: Once | INTRAMUSCULAR | Status: DC

## 2010-09-23 DIAGNOSIS — Z91030 Bee allergy status: Secondary | ICD-10-CM | POA: Insufficient documentation

## 2010-09-23 NOTE — Progress Notes (Signed)
  Subjective:    Patient ID: Bruce Harris, male    DOB: 12-08-1970, 40 y.o.   MRN: [id number]  HPI Patient presents to clinic for evaluation of bee sting. Yesterday was stung on his left ear and developed transient mild tongue swelling which is now resolved. No shortness of breath or dysphagia. Was evaluated by occupational health and placed on Benadryl and prednisone. Tolerates medication without adverse effects. Has no EpiPen. Her swelling has significantly decreased. No alleviating or exacerbating factors. No other complaints.  Reviewed past medical history, medications and allergies  Review of Systems see history of present illness     Objective:   Physical Exam  Nursing note and vitals reviewed. Constitutional: He appears well-developed and well-nourished. No distress.  HENT:  Head: Normocephalic and atraumatic.  Right Ear: External ear normal.  Left Ear: External ear normal.  Nose: Nose normal.  Mouth/Throat: Oropharynx is clear and moist. No oropharyngeal exudate.  Eyes: Conjunctivae are normal. No scleral icterus.  Neck: Neck supple.  Neurological: He is alert.  Skin: Skin is warm and dry. No rash noted. He is not diaphoretic. No erythema.  Psychiatric: He has a normal mood and affect.          Assessment & Plan:

## 2010-09-23 NOTE — Assessment & Plan Note (Signed)
Currently asymptomatic. Continue prednisone. Provided with prescription for EpiPen as needed. Patient aware of instructions for use

## 2010-10-08 ENCOUNTER — Other Ambulatory Visit: Payer: 59

## 2010-10-08 ENCOUNTER — Other Ambulatory Visit: Payer: Self-pay | Admitting: Internal Medicine

## 2010-10-08 DIAGNOSIS — Z0000 Encounter for general adult medical examination without abnormal findings: Secondary | ICD-10-CM

## 2010-10-18 ENCOUNTER — Ambulatory Visit: Payer: 59 | Admitting: Internal Medicine

## 2010-11-06 ENCOUNTER — Encounter: Payer: Self-pay | Admitting: Internal Medicine

## 2010-11-06 ENCOUNTER — Ambulatory Visit (INDEPENDENT_AMBULATORY_CARE_PROVIDER_SITE_OTHER): Payer: 59 | Admitting: Internal Medicine

## 2010-11-06 ENCOUNTER — Telehealth: Payer: Self-pay | Admitting: Internal Medicine

## 2010-11-06 DIAGNOSIS — Z79899 Other long term (current) drug therapy: Secondary | ICD-10-CM

## 2010-11-06 DIAGNOSIS — I10 Essential (primary) hypertension: Secondary | ICD-10-CM

## 2010-11-06 DIAGNOSIS — R252 Cramp and spasm: Secondary | ICD-10-CM

## 2010-11-06 DIAGNOSIS — E785 Hyperlipidemia, unspecified: Secondary | ICD-10-CM

## 2010-11-06 DIAGNOSIS — F528 Other sexual dysfunction not due to a substance or known physiological condition: Secondary | ICD-10-CM

## 2010-11-06 MED ORDER — ROSUVASTATIN CALCIUM 20 MG PO TABS: 20.0000 mg | ORAL_TABLET | Freq: Every day | ORAL | Status: DC

## 2010-11-06 MED ORDER — TADALAFIL 20 MG PO TABS: ORAL_TABLET | ORAL | Status: DC

## 2010-11-06 NOTE — Patient Instructions (Signed)
Please schedule chem7 (v58.69), cbc, tsh (401.9) and lipid/lft (272.4) for tomorrow morning fasting (9/5) Also please schedule chem7 (v58.69) and lipid/lft (272.4) prior to next appointment

## 2010-11-06 NOTE — Telephone Encounter (Signed)
Please order labs. chem7 v58.69;cbc, tsh 401.9, lipid/lft 272.4. Patient will go to the [facility] lab tomorrow morning, per Dr. Rodena Medin.   Also, patient has appt on 05-07-11. Please order labs for one week prior. chem7 v58.69 and lipid/lft 272.4. Patient will go to [facility] lab.

## 2010-11-07 ENCOUNTER — Ambulatory Visit: Payer: 59

## 2010-11-07 DIAGNOSIS — E785 Hyperlipidemia, unspecified: Secondary | ICD-10-CM

## 2010-11-07 DIAGNOSIS — Z79899 Other long term (current) drug therapy: Secondary | ICD-10-CM

## 2010-11-07 DIAGNOSIS — I10 Essential (primary) hypertension: Secondary | ICD-10-CM

## 2010-11-07 LAB — HEPATIC FUNCTION PANEL
ALT: 15 U/L (ref 0–53)
Bilirubin, Direct: 0.1 mg/dL (ref 0.0–0.3)
Total Bilirubin: 0.5 mg/dL (ref 0.3–1.2)

## 2010-11-07 LAB — CBC WITH DIFFERENTIAL/PLATELET
Basophils Relative: 0.4 % (ref 0.0–3.0)
Eosinophils Absolute: 0.1 10*3/uL (ref 0.0–0.7)
HCT: 40.2 % (ref 39.0–52.0)
Hemoglobin: 12.9 g/dL — ABNORMAL LOW (ref 13.0–17.0)
Lymphs Abs: 2 10*3/uL (ref 0.7–4.0)
MCHC: 32 g/dL (ref 30.0–36.0)
MCV: 83.1 fl (ref 78.0–100.0)
Monocytes Absolute: 0.3 10*3/uL (ref 0.1–1.0)
Neutro Abs: 2.7 10*3/uL (ref 1.4–7.7)
RBC: 4.84 Mil/uL (ref 4.22–5.81)

## 2010-11-07 LAB — BASIC METABOLIC PANEL
BUN: 17 mg/dL (ref 6–23)
GFR: 82.22 mL/min (ref >60.00)
Glucose, Bld: 101 mg/dL — ABNORMAL HIGH (ref 70–99)
Potassium: 3.6 mEq/L (ref 3.5–5.1)

## 2010-11-07 LAB — LIPID PANEL
HDL: 45 mg/dL (ref >39.00)
Total CHOL/HDL Ratio: 5
VLDL: 26.2 mg/dL (ref 0.0–40.0)

## 2010-11-07 NOTE — Telephone Encounter (Signed)
Future lab orders have been placed for the last week in February 2013

## 2010-11-11 DIAGNOSIS — R252 Cramp and spasm: Secondary | ICD-10-CM | POA: Insufficient documentation

## 2010-11-11 NOTE — Progress Notes (Signed)
  Subjective:    Patient ID: Bruce Harris, male    DOB: 28-Jan-1971, 40 y.o.   MRN: [id number]  HPI Pt presents to clinic for followup of multiple medical problems. Intermittent right leg cramping without exacerbating or alleviating factors. No other areas involved. No radicular pain, weakness or paresthesia. BP reviewed normotensive. Tolerates statin tx without myalgias or abn lft.  Past Medical History  Diagnosis Date  . Hyperlipidemia   . Hypertension   . Cardiomyopathy   . Gout    Past Surgical History  Procedure Date  . No past surgeries     reports that he has never smoked. He has never used smokeless tobacco. His alcohol and drug histories not on file. family history includes Coronary artery disease in an unspecified family member; Diabetes in an unspecified family member; Hyperlipidemia in an unspecified family member; and Hypertension in an unspecified family member. No Known Allergies   Review of Systems     Objective:   Physical Exam  Physical Exam  Nursing note and vitals reviewed. Constitutional: Appears well-developed and well-nourished. No distress.  HENT:  Head: Normocephalic and atraumatic.  Right Ear: External ear normal.  Left Ear: External ear normal.  Eyes: Conjunctivae are normal. No scleral icterus.  Neck: Neck supple. Carotid bruit is not present.  Cardiovascular: Normal rate, regular rhythm and normal heart sounds.  Exam reveals no gallop and no friction rub.   No murmur heard. Pulmonary/Chest: Effort normal and breath sounds normal. No respiratory distress. He has no wheezes. no rales.  Lymphadenopathy:    He has no cervical adenopathy.  Neurological:Alert.  Skin: Skin is warm and dry. Not diaphoretic.  Psychiatric: Has a normal mood and affect.        Assessment & Plan:

## 2010-11-11 NOTE — Assessment & Plan Note (Signed)
Stable. rf cialis. Intolerant of viagra

## 2010-11-11 NOTE — Assessment & Plan Note (Signed)
Normotensive and stable. Continue current regimen. Monitor bp as outpt and followup in clinic as scheduled. Obtain cbc, chem7 and tsh  

## 2010-11-11 NOTE — Assessment & Plan Note (Signed)
Begin mag ox one po qd. Followup if no improvement or worsening.

## 2010-11-11 NOTE — Assessment & Plan Note (Signed)
Obtain lipid/lft. 

## 2011-01-03 ENCOUNTER — Emergency Department (HOSPITAL_COMMUNITY)
Admission: EM | Admit: 2011-01-03 | Discharge: 2011-01-03 | Disposition: A | Discharge status: Home / Self Care | Payer: No Typology Code available for payment source | Attending: Emergency Medicine | Admitting: Emergency Medicine

## 2011-01-03 DIAGNOSIS — S335XXA Sprain of ligaments of lumbar spine, initial encounter: Secondary | ICD-10-CM | POA: Insufficient documentation

## 2011-01-03 DIAGNOSIS — E785 Hyperlipidemia, unspecified: Secondary | ICD-10-CM | POA: Insufficient documentation

## 2011-01-03 DIAGNOSIS — Y92410 Unspecified street and highway as the place of occurrence of the external cause: Secondary | ICD-10-CM | POA: Insufficient documentation

## 2011-01-03 DIAGNOSIS — I10 Essential (primary) hypertension: Secondary | ICD-10-CM | POA: Insufficient documentation

## 2011-01-03 DIAGNOSIS — M25519 Pain in unspecified shoulder: Secondary | ICD-10-CM | POA: Insufficient documentation

## 2011-02-11 ENCOUNTER — Other Ambulatory Visit: Payer: Self-pay | Admitting: Internal Medicine

## 2011-02-11 ENCOUNTER — Encounter: Payer: Self-pay | Admitting: Internal Medicine

## 2011-02-11 NOTE — Telephone Encounter (Signed)
Opened in error

## 2011-02-11 NOTE — Telephone Encounter (Signed)
Patient returned phone call, and left voice message verifying he is taking a 10 mg tablet of Amlodipine.  Rx refills sent to pharmacy.

## 2011-02-11 NOTE — Telephone Encounter (Signed)
This encounter was created in error - please disregard.

## 2011-02-11 NOTE — Telephone Encounter (Signed)
Call placed to patient at [phone], no answer.  A detailed voice message was left for patient to return call to verify current dose of Amlodipine.

## 2011-04-23 ENCOUNTER — Telehealth: Payer: Self-pay | Admitting: Internal Medicine

## 2011-04-23 MED ORDER — TADALAFIL 20 MG PO TABS: ORAL_TABLET | ORAL | Status: DC

## 2011-04-23 NOTE — Telephone Encounter (Signed)
Rx refill sent to pharmacy. 

## 2011-04-30 ENCOUNTER — Telehealth: Payer: Self-pay | Admitting: Internal Medicine

## 2011-04-30 MED ORDER — TADALAFIL 20 MG PO TABS: ORAL_TABLET | ORAL | Status: DC

## 2011-04-30 NOTE — Telephone Encounter (Signed)
Rx refill sent to pharmacy. 

## 2011-05-06 ENCOUNTER — Other Ambulatory Visit (INDEPENDENT_AMBULATORY_CARE_PROVIDER_SITE_OTHER): Payer: 59

## 2011-05-06 DIAGNOSIS — E785 Hyperlipidemia, unspecified: Secondary | ICD-10-CM

## 2011-05-06 DIAGNOSIS — Z79899 Other long term (current) drug therapy: Secondary | ICD-10-CM

## 2011-05-06 LAB — BASIC METABOLIC PANEL
BUN: 15 mg/dL (ref 6–23)
CO2: 26 mEq/L (ref 19–32)
Calcium: 9.1 mg/dL (ref 8.4–10.5)
GFR: 89.4 mL/min (ref >60.00)
Glucose, Bld: 88 mg/dL (ref 70–99)

## 2011-05-06 LAB — LIPID PANEL: Total CHOL/HDL Ratio: 4

## 2011-05-06 LAB — HEPATIC FUNCTION PANEL
ALT: 29 U/L (ref 0–53)
AST: 34 U/L (ref 0–37)
Bilirubin, Direct: 0 mg/dL (ref 0.0–0.3)
Total Bilirubin: 0.4 mg/dL (ref 0.3–1.2)

## 2011-05-07 ENCOUNTER — Ambulatory Visit (INDEPENDENT_AMBULATORY_CARE_PROVIDER_SITE_OTHER): Payer: 59 | Admitting: Internal Medicine

## 2011-05-07 ENCOUNTER — Encounter: Payer: Self-pay | Admitting: Internal Medicine

## 2011-05-07 DIAGNOSIS — R739 Hyperglycemia, unspecified: Secondary | ICD-10-CM

## 2011-05-07 DIAGNOSIS — R7309 Other abnormal glucose: Secondary | ICD-10-CM

## 2011-05-07 DIAGNOSIS — R5383 Other fatigue: Secondary | ICD-10-CM

## 2011-05-07 DIAGNOSIS — E291 Testicular hypofunction: Secondary | ICD-10-CM

## 2011-05-07 NOTE — Patient Instructions (Signed)
Please schedule fasting labs prior to next visit Cbc-401.9, chem7-v58.69, lipid/lft-272.4 

## 2011-05-08 ENCOUNTER — Other Ambulatory Visit (INDEPENDENT_AMBULATORY_CARE_PROVIDER_SITE_OTHER): Payer: 59

## 2011-05-08 DIAGNOSIS — R5381 Other malaise: Secondary | ICD-10-CM

## 2011-05-08 DIAGNOSIS — R739 Hyperglycemia, unspecified: Secondary | ICD-10-CM

## 2011-05-08 DIAGNOSIS — R5383 Other fatigue: Secondary | ICD-10-CM

## 2011-05-08 DIAGNOSIS — E291 Testicular hypofunction: Secondary | ICD-10-CM

## 2011-05-08 DIAGNOSIS — R7309 Other abnormal glucose: Secondary | ICD-10-CM

## 2011-05-08 LAB — TSH: TSH: 0.92 u[IU]/mL (ref 0.35–5.50)

## 2011-05-08 LAB — HEMOGLOBIN A1C: Hgb A1c MFr Bld: 6.4 % (ref 4.6–6.5)

## 2011-05-11 DIAGNOSIS — R739 Hyperglycemia, unspecified: Secondary | ICD-10-CM | POA: Insufficient documentation

## 2011-05-11 DIAGNOSIS — R5383 Other fatigue: Secondary | ICD-10-CM | POA: Insufficient documentation

## 2011-05-11 NOTE — Assessment & Plan Note (Signed)
Obtain a1c.  

## 2011-05-11 NOTE — Assessment & Plan Note (Signed)
Obtain early am testosterone. Consider endocrine consult if depressed given young age.

## 2011-05-11 NOTE — Progress Notes (Signed)
  Subjective:    Patient ID: Bruce Harris, male    DOB: [birth year], 41 y.o.   MRN: [id number]  HPI Pt presents to clinic for followup of multiple medical problems. Notes fatigue and ED. Chart review shows past h/o hypogonadism not currently tx'ed. Tolerates statin tx without myalgias or abn lft.   Past Medical History  Diagnosis Date  . Hyperlipidemia   . Hypertension   . Cardiomyopathy   . Gout    Past Surgical History  Procedure Date  . No past surgeries     reports that he has never smoked. He has never used smokeless tobacco. His alcohol and drug histories not on file. family history includes Coronary artery disease in an unspecified family member; Diabetes in an unspecified family member; Hyperlipidemia in an unspecified family member; and Hypertension in an unspecified family member. No Known Allergies    Review of Systems see hpi     Objective:   Physical Exam  Physical Exam  Nursing note and vitals reviewed. Constitutional: Appears well-developed and well-nourished. No distress.  HENT:  Head: Normocephalic and atraumatic.  Right Ear: External ear normal.  Left Ear: External ear normal.  Eyes: Conjunctivae are normal. No scleral icterus.  Neck: Neck supple. Carotid bruit is not present.  Cardiovascular: Normal rate, regular rhythm and normal heart sounds.  Exam reveals no gallop and no friction rub.   No murmur heard. Pulmonary/Chest: Effort normal and breath sounds normal. No respiratory distress. He has no wheezes. no rales.  Lymphadenopathy:    He has no cervical adenopathy.  Neurological:Alert.  Skin: Skin is warm and dry. Not diaphoretic.  Psychiatric: Has a normal mood and affect.        Assessment & Plan:

## 2011-05-11 NOTE — Assessment & Plan Note (Signed)
Obtain tsh and testosterone

## 2011-05-15 ENCOUNTER — Other Ambulatory Visit: Payer: Self-pay | Admitting: Internal Medicine

## 2011-05-15 DIAGNOSIS — E785 Hyperlipidemia, unspecified: Secondary | ICD-10-CM

## 2011-05-15 DIAGNOSIS — I10 Essential (primary) hypertension: Secondary | ICD-10-CM

## 2011-05-15 DIAGNOSIS — Z79899 Other long term (current) drug therapy: Secondary | ICD-10-CM

## 2011-05-15 DIAGNOSIS — R7309 Other abnormal glucose: Secondary | ICD-10-CM

## 2011-06-07 ENCOUNTER — Telehealth: Payer: Self-pay | Admitting: *Deleted

## 2011-06-07 MED ORDER — GUAIFENESIN-CODEINE 100-10 MG/5ML PO SOLN: 5.0000 mL | Freq: Three times a day (TID) | ORAL | Status: AC | PRN

## 2011-06-07 NOTE — Telephone Encounter (Signed)
robutussin ac codeine 5ml po q8 hours prn cough #120 no rf. Can alternate tylenol and advil for headache prn

## 2011-06-07 NOTE — Telephone Encounter (Signed)
Verbal order provided to pharmacist Tresa Endo. Call placed to patient at [phone],he was informed Rx sent to pharmacy.

## 2011-06-07 NOTE — Telephone Encounter (Signed)
Patient called stating he has a productive cough green, headache, and a warm feeling since Monday. He would like to know if Dr Rodena Medin would provide him a Rx cough syrup and what does he advise him to take for his headche.

## 2011-06-18 ENCOUNTER — Ambulatory Visit (INDEPENDENT_AMBULATORY_CARE_PROVIDER_SITE_OTHER): Payer: 59 | Admitting: Internal Medicine

## 2011-06-18 ENCOUNTER — Encounter: Payer: Self-pay | Admitting: Internal Medicine

## 2011-06-18 VITALS — BP 110/80 | HR 67 | Temp 98.0°F | Resp 16

## 2011-06-18 DIAGNOSIS — J329 Chronic sinusitis, unspecified: Secondary | ICD-10-CM

## 2011-06-18 MED ORDER — TRAMADOL HCL 50 MG PO TABS: 50.0000 mg | ORAL_TABLET | Freq: Three times a day (TID) | ORAL | Status: AC | PRN

## 2011-06-18 MED ORDER — AMOXICILLIN-POT CLAVULANATE 875-125 MG PO TABS: 1.0000 | ORAL_TABLET | Freq: Two times a day (BID) | ORAL | Status: AC

## 2011-06-19 ENCOUNTER — Other Ambulatory Visit: Payer: Self-pay | Admitting: *Deleted

## 2011-06-19 DIAGNOSIS — J329 Chronic sinusitis, unspecified: Secondary | ICD-10-CM | POA: Insufficient documentation

## 2011-06-19 MED ORDER — FLUTICASONE PROPIONATE 50 MCG/ACT NA SUSP: 1.0000 | Freq: Every day | NASAL | Status: DC

## 2011-06-19 NOTE — Telephone Encounter (Signed)
Patient called and left voice message requesting a refill on Flonase nasal spray to pharmacy. His message stated that the pharmacy informed him the Rx he had on file has expired and a new Rx is needed.  Rx refill sent to pharmacy.

## 2011-06-19 NOTE — Assessment & Plan Note (Signed)
Attempt augmentin. Tramadol short term pain. Followup if no improvement or worsening.

## 2011-06-19 NOTE — Progress Notes (Signed)
  Subjective:    Patient ID: Bruce Harris, male    DOB: 1970/11/27, 41 y.o.   MRN: [id number]  HPI Pt presents to clinic for evaluaiton of sinus pain and congestion. Notes over 14d h/o nasal congestion and drainage as well as paranasal sinus discomfort. Has lg temp 99.3 at home recently. No chills or teeth pain. No cough. +myalgias. Sinus pain and myalgias not significantly helped by tylenol/ibuprofen.   Past Medical History  Diagnosis Date  . Hyperlipidemia   . Hypertension   . Cardiomyopathy   . Gout    Past Surgical History  Procedure Date  . No past surgeries     reports that he has never smoked. He has never used smokeless tobacco. His alcohol and drug histories not on file. family history includes Coronary artery disease in an unspecified family member; Diabetes in an unspecified family member; Hyperlipidemia in an unspecified family member; and Hypertension in an unspecified family member. No Known Allergies   Review of Systems see hpi     Objective:   Physical Exam  Nursing note and vitals reviewed. Constitutional: He appears well-developed and well-nourished. No distress.  HENT:  Head: Normocephalic and atraumatic.  Right Ear: Tympanic membrane, external ear and ear canal normal.  Left Ear: Tympanic membrane, external ear and ear canal normal.  Nose: Nose normal.  Mouth/Throat: Oropharynx is clear and moist. No oropharyngeal exudate.  Eyes: Conjunctivae and EOM are normal. Right eye exhibits no discharge. Left eye exhibits no discharge. No scleral icterus.  Neck: Neck supple.  Lymphadenopathy:    He has no cervical adenopathy.  Neurological: He is alert.  Skin: Skin is warm and dry. He is not diaphoretic.  Psychiatric: He has a normal mood and affect.          Assessment & Plan:

## 2011-06-21 ENCOUNTER — Emergency Department (HOSPITAL_COMMUNITY)
Admission: EM | Admit: 2011-06-21 | Discharge: 2011-06-21 | Disposition: A | Discharge status: Home / Self Care | Payer: 59 | Attending: Emergency Medicine | Admitting: Emergency Medicine

## 2011-06-21 ENCOUNTER — Encounter (HOSPITAL_COMMUNITY): Payer: Self-pay | Admitting: Emergency Medicine

## 2011-06-21 DIAGNOSIS — IMO0001 Reserved for inherently not codable concepts without codable children: Secondary | ICD-10-CM | POA: Insufficient documentation

## 2011-06-21 DIAGNOSIS — E785 Hyperlipidemia, unspecified: Secondary | ICD-10-CM | POA: Insufficient documentation

## 2011-06-21 DIAGNOSIS — I428 Other cardiomyopathies: Secondary | ICD-10-CM | POA: Insufficient documentation

## 2011-06-21 DIAGNOSIS — R05 Cough: Secondary | ICD-10-CM | POA: Insufficient documentation

## 2011-06-21 DIAGNOSIS — J329 Chronic sinusitis, unspecified: Secondary | ICD-10-CM | POA: Insufficient documentation

## 2011-06-21 DIAGNOSIS — R059 Cough, unspecified: Secondary | ICD-10-CM | POA: Insufficient documentation

## 2011-06-21 DIAGNOSIS — I10 Essential (primary) hypertension: Secondary | ICD-10-CM | POA: Insufficient documentation

## 2011-06-21 MED ORDER — HYDROCOD POLST-CHLORPHEN POLST 10-8 MG/5ML PO LQCR: 5.0000 mL | Freq: Two times a day (BID) | ORAL | Status: DC | PRN

## 2011-06-21 NOTE — ED Notes (Signed)
Pt presented to the ER with multiple complaints, state having sx of cough, chills, fever and nasal congestion, pt further explains that he was seen by the PCP on Tuesday, in [place], and Dx with sinusitis. Pt however states that sx not improving and that coughing increased with some chest pain.

## 2011-06-21 NOTE — ED Provider Notes (Signed)
History     CSN: [id number]  Arrival date & time 06/21/11  0600   First MD Initiated Contact with Patient 06/21/11 [phone]      Chief Complaint  Patient presents with  . Cough  . Nasal Congestion  . Fever    (Consider location/radiation/quality/duration/timing/severity/associated sxs/prior treatment) HPI Comments: 41 yo black male presents this morning for nasal congestion, fevers, body aches.  Reports dry cough starting 8 days ago, worsening 3 days ago with fever of 100.1, body aches, ear pain, headaches associated with the nasal congestion.  Went to PCP and was prescribed tramadol and a course of Augmentin.  Has taken 3 days of the abx, along with ibuprofen and flonase with no benefit.  No neck pain or stiffness. No vomiting.   Patient is a 41 y.o. male presenting with fever. The history is provided by the patient.  Fever Primary symptoms of the febrile illness include fever, fatigue, headaches, cough and myalgias. Primary symptoms do not include wheezing, shortness of breath, abdominal pain, nausea, vomiting, diarrhea or rash. The current episode started 3 to 5 days ago. This is a new problem. The problem has not changed since onset. The headache is not associated with neck stiffness.     Past Medical History  Diagnosis Date  . Hyperlipidemia   . Hypertension   . Cardiomyopathy   . Gout     Past Surgical History  Procedure Date  . No past surgeries     Family History  Problem Relation Age of Onset  . Coronary artery disease    . Diabetes    . Hyperlipidemia    . Hypertension      History  Substance Use Topics  . Smoking status: Never Smoker   . Smokeless tobacco: Never Used  . Alcohol Use: Yes     socail       Review of Systems  Constitutional: Positive for fever, appetite change and fatigue. Negative for chills.  HENT: Positive for ear pain, congestion, sore throat, rhinorrhea and sinus pressure. Negative for neck pain and neck stiffness.   Eyes: Negative  for redness.  Respiratory: Positive for cough. Negative for shortness of breath and wheezing.   Gastrointestinal: Negative for nausea, vomiting, abdominal pain and diarrhea.  Musculoskeletal: Positive for myalgias.  Skin: Negative for rash.  Neurological: Positive for headaches.  Hematological: Negative for adenopathy.    Allergies  Review of patient's allergies indicates no known allergies.  Home Medications   Current Outpatient Rx  Name Route Sig Dispense Refill  . AMLODIPINE BESYLATE 10 MG PO TABS Oral Take 1 tablet (10 mg total) by mouth daily. 90 tablet 1  . AMOXICILLIN-POT CLAVULANATE 875-125 MG PO TABS Oral Take 1 tablet by mouth 2 (two) times daily. 14 tablet 0  . ASPIRIN 81 MG PO TABS Oral Take 81 mg by mouth daily.      Marland Kitchen EPINEPHRINE 0.3 MG/0.3ML IJ DEVI Intramuscular Inject 0.3 mLs (0.3 mg total) into the muscle once. 1 Device 6  . FLUTICASONE PROPIONATE 50 MCG/ACT NA SUSP Nasal Place 1 spray into the nose daily. 48 g 3  . IBUPROFEN 200 MG PO TABS Oral Take 400 mg by mouth every 6 (six) hours as needed. Pain    . LISINOPRIL 10 MG PO TABS  TAKE ONE TABLET BY MOUTH EVERY DAY 90 tablet 1  . ROSUVASTATIN CALCIUM 20 MG PO TABS Oral Take 1 tablet (20 mg total) by mouth daily. 90 tablet 1  . TADALAFIL 20 MG PO TABS  One by mouth every 36 hours as needed for intercourse 24 tablet 1    Qty updated  . TRAMADOL HCL 50 MG PO TABS Oral Take 1 tablet (50 mg total) by mouth every 8 (eight) hours as needed for pain. 20 tablet 0    BP 148/90  Pulse 93  Temp(Src) 99.1 F (37.3 C) (Oral)  Resp 20  SpO2 99%  Physical Exam  Nursing note and vitals reviewed. Constitutional: He is oriented to person, place, and time. He appears well-developed and well-nourished.  HENT:  Head: Normocephalic and atraumatic.  Right Ear: Tympanic membrane, external ear and ear canal normal.  Left Ear: Tympanic membrane, external ear and ear canal normal.  Nose: Mucosal edema present. Right sinus exhibits  maxillary sinus tenderness. Left sinus exhibits maxillary sinus tenderness.  Mouth/Throat: Uvula is midline, oropharynx is clear and moist and mucous membranes are normal.  Eyes: Conjunctivae are normal. Pupils are equal, round, and reactive to light.  Neck: Normal range of motion. Neck supple.  Cardiovascular: Normal rate, regular rhythm and normal heart sounds.   Pulmonary/Chest: Breath sounds normal. No respiratory distress.  Lymphadenopathy:    He has no cervical adenopathy.  Neurological: He is alert and oriented to person, place, and time.  Skin: Skin is warm and dry.  Psychiatric: He has a normal mood and affect.    ED Course  Procedures (including critical care time)  Labs Reviewed - No data to display No results found.   1. Sinusitis     7:52 AM Patient seen and examined.   Vital signs reviewed and are as follows: Filed Vitals:   06/21/11 0735  BP: 137/104  Pulse: 97  Temp:   Resp: 18   Patient counseled on supportive care for viral URI and s/s to return including worsening symptoms, persistent fever, persistent vomiting, worsening SOB or trouble breathing or if they have any other concerns.  Urged to see PCP if symptoms persist for more than 3 days. Patient verbalizes understanding and agrees with plan.   Patient counseled on use of narcotic cough medications. Urged not to drink alcohol, drive, or perform any other activities that requires focus while taking these medications. The patient verbalizes understanding and agrees with the plan.  MDM  Likely viral process, URI/sinusitis, influenza also possibility. Vitals stable. Patient already on course of augmentin. Patient is healthy, no serious medical conditions or immunocompromise. Will continue symptomatic treatment.         [place], [place] 06/21/11 [phone]

## 2011-06-21 NOTE — Discharge Instructions (Signed)
Please read and follow all provided instructions.  Your diagnoses today include:  1. Sinusitis     Tests performed today include:  Vital signs. See below for your results today.   Medications prescribed:   Tussionex cough syrup - narcotic cough syrup  You have been prescribed narcotic pain medication such as Vicodin or Percocet: DO NOT drive or perform any activities that require you to be awake and alert because this medicine can make you drowsy. BE VERY CAREFUL not to take multiple medicines containing Tylenol (also called acetaminophen). Doing so can lead to an overdose which can damage your liver and cause liver failure and possibly death.   Take any prescribed medications only as directed.  Home care instructions:  Follow any educational materials contained in this packet.  Follow-up instructions: Please follow-up with your primary care provider in the next 3 days for further evaluation of your symptoms. If you do not have a primary care doctor -- see below for referral information.   Return instructions:   Please return to the Emergency Department if you experience worsening symptoms.   Return with persistent fever, persistent vomiting, worsening shortness of breath or trouble breathing  Please return if you have any other emergent concerns.  Additional Information:  Your vital signs today were: BP 148/90  Pulse 93  Temp(Src) 99.1 F (37.3 C) (Oral)  Resp 20  SpO2 99% If your blood pressure (BP) was elevated above 135/85 this visit, please have this repeated by your doctor within one month. -------------- No Primary Care Doctor Call [facility]  [phone] Other agencies that provide inexpensive medical care    [facility]  [phone]    [facility]  [phone]    [facility]  [phone]    [facility]  [phone]    [facility]  [phone]    [facility]  [phone] -------------- RESOURCE GUIDE:  Dental  Problems  Patients with Medicaid: [facility] [phone] [address]                                            [phone] [address] Phone:  [phone]                                                   Phone:  [phone]  If unable to pay or uninsured, contact:  [facility] or [facility]. to become qualified for the adult dental clinic.  Chronic Pain Problems Contact [facility]  [phone] Patients need to be referred by their primary care doctor.  Insufficient Money for Medicine Contact [employer]:  call "211" or [facility] [phone].  Psychological Services [facility]  [phone] [facility]  [phone] [facility]   [phone] (emergency services [phone])  Substance Abuse Resources [facility]  [phone] [facility] [phone] [facility] [place] [phone] Floydene Flock [phone] Residential & Outpatient Substance Abuse Program  [phone]  Abuse/Neglect [facility] Child Abuse Hotline [phone] [facility] Child Abuse Hotline [phone] (After Hours)  Emergency Shelter [employer] [phone]  M[facility] [phone] [facility] [phone]  [facility]  [facility]     [employer]                          [facility] [address] [place]                       [address]      [address]  Blondell Reveal Phone:  [phone]                                   Phone:  [phone]                 Phone:  [phone]  [facility] Phone:  [phone]  [employer] Child Abuse Hotline [phone] (After Hours)

## 2011-06-21 NOTE — ED Provider Notes (Signed)
Medical screening examination/treatment/procedure(s) were performed by non-physician practitioner and as supervising physician I was immediately available for consultation/collaboration.    Markeita Alicia R Micheale Schlack, MD 06/21/11 1624 

## 2011-09-06 ENCOUNTER — Emergency Department (HOSPITAL_BASED_OUTPATIENT_CLINIC_OR_DEPARTMENT_OTHER)
Admission: EM | Admit: 2011-09-06 | Discharge: 2011-09-06 | Disposition: A | Discharge status: Home / Self Care | Payer: Worker's Compensation | Attending: Emergency Medicine | Admitting: Emergency Medicine

## 2011-09-06 ENCOUNTER — Encounter (HOSPITAL_BASED_OUTPATIENT_CLINIC_OR_DEPARTMENT_OTHER): Payer: Self-pay

## 2011-09-06 DIAGNOSIS — W2209XA Striking against other stationary object, initial encounter: Secondary | ICD-10-CM | POA: Insufficient documentation

## 2011-09-06 DIAGNOSIS — S40029A Contusion of unspecified upper arm, initial encounter: Secondary | ICD-10-CM | POA: Insufficient documentation

## 2011-09-06 MED ORDER — IBUPROFEN 600 MG PO TABS: 600.0000 mg | ORAL_TABLET | Freq: Four times a day (QID) | ORAL | Status: AC | PRN

## 2011-09-06 NOTE — ED Notes (Signed)
Supervisor is with pt at bedside, both pt and supervisor state pt does not need uds or bat.

## 2011-09-06 NOTE — ED Provider Notes (Signed)
History     CSN: [id number]  Arrival date & time 09/06/11  0812   First MD Initiated Contact with Patient 09/06/11 [phone]      Chief Complaint  Patient presents with  . Arm Injury     HPI One week ago the patient was struck in the tricep area of the right arm with a tree limb.  This occurred while he was working.  He became very sore for the next few days.  However over the last few days he'll have intermittent pain and numbness in the upper arm area.  The numbness is not radicular it does not radiate down to his fingers or hands.  Patient denies chest pain, diaphoresis, nausea, vomiting.  Patient currently has been taking a leave which is getting some relief.  Patient denies any weakness. Past Medical History  Diagnosis Date  . Hyperlipidemia   . Hypertension   . Cardiomyopathy   . Gout     Past Surgical History  Procedure Date  . No past surgeries   . Hand surgery     Family History  Problem Relation Age of Onset  . Coronary artery disease    . Diabetes    . Hyperlipidemia    . Hypertension      History  Substance Use Topics  . Smoking status: Never Smoker   . Smokeless tobacco: Never Used  . Alcohol Use: Yes     socail       Review of Systems  All other systems reviewed and are negative.    Allergies  Review of patient's allergies indicates no known allergies.  Home Medications   Current Outpatient Rx  Name Route Sig Dispense Refill  . AMLODIPINE BESYLATE 10 MG PO TABS Oral Take 1 tablet (10 mg total) by mouth daily. 90 tablet 1  . ASPIRIN 81 MG PO TABS Oral Take 81 mg by mouth daily.      Marland Kitchen HYDROCOD POLST-CPM POLST ER 10-8 MG/5ML PO LQCR Oral Take 5 mLs by mouth every 12 (twelve) hours as needed. 115 mL 0  . EPINEPHRINE 0.3 MG/0.3ML IJ DEVI Intramuscular Inject 0.3 mLs (0.3 mg total) into the muscle once. 1 Device 6  . FLUTICASONE PROPIONATE 50 MCG/ACT NA SUSP Nasal Place 1 spray into the nose daily. 48 g 3  . IBUPROFEN 600 MG PO TABS Oral Take 1  tablet (600 mg total) by mouth every 6 (six) hours as needed for pain. 30 tablet 0  . LISINOPRIL 10 MG PO TABS  TAKE ONE TABLET BY MOUTH EVERY DAY 90 tablet 1  . ROSUVASTATIN CALCIUM 20 MG PO TABS Oral Take 1 tablet (20 mg total) by mouth daily. 90 tablet 1  . TADALAFIL 20 MG PO TABS  One by mouth every 36 hours as needed for intercourse 24 tablet 1    Qty updated    BP 136/94  Pulse 71  Temp 97.6 F (36.4 C) (Oral)  Resp 16  Ht 5\' 9"  (1.753 m)  Wt 215 lb (97.523 kg)  BMI 31.75 kg/m2  SpO2 100%  Physical Exam  Nursing note and vitals reviewed. Constitutional: He is oriented to person, place, and time. He appears well-developed and well-nourished. No distress.  HENT:  Head: Normocephalic and atraumatic.  Eyes: Pupils are equal, round, and reactive to light.  Neck: Normal range of motion.  Cardiovascular: Normal rate and intact distal pulses.   Pulmonary/Chest: No respiratory distress.  Abdominal: Normal appearance. He exhibits no distension.  Musculoskeletal: Normal range of  motion.       Arms: Neurological: He is alert and oriented to person, place, and time. No cranial nerve deficit.  Skin: Skin is warm and dry. No rash noted.  Psychiatric: He has a normal mood and affect. His behavior is normal.    ED Course  Procedures (including critical care time)  Labs Reviewed - No data to display No results found.   1. Contusion of arm       MDM          Nelia Shi, MD 09/06/11 [phone]

## 2011-09-06 NOTE — ED Notes (Signed)
Pt reports right arm pain after sustaining an injury 1 week ago while at work.

## 2011-10-30 LAB — HEMOGLOBIN A1C: Mean Plasma Glucose: 126 mg/dL — ABNORMAL HIGH (ref <117)

## 2011-10-30 LAB — HEPATIC FUNCTION PANEL
ALT: 16 U/L (ref 0–53)
Indirect Bilirubin: 0.5 mg/dL (ref 0.0–0.9)
Total Protein: 7.3 g/dL (ref 6.0–8.3)

## 2011-10-30 LAB — BASIC METABOLIC PANEL
Calcium: 9.4 mg/dL (ref 8.4–10.5)
Creat: 1.15 mg/dL (ref 0.50–1.35)

## 2011-10-30 LAB — CBC WITH DIFFERENTIAL/PLATELET
Basophils Relative: 1 % (ref 0–1)
Hemoglobin: 14.1 g/dL (ref 13.0–17.0)
Lymphocytes Relative: 47 % — ABNORMAL HIGH (ref 12–46)
Lymphs Abs: 1.9 10*3/uL (ref 0.7–4.0)
Monocytes Relative: 9 % (ref 3–12)
Neutro Abs: 1.7 10*3/uL (ref 1.7–7.7)
Neutrophils Relative %: 39 % — ABNORMAL LOW (ref 43–77)
RBC: 5.44 MIL/uL (ref 4.22–5.81)
WBC: 4.2 10*3/uL (ref 4.0–10.5)

## 2011-10-30 LAB — LIPID PANEL
Cholesterol: 250 mg/dL — ABNORMAL HIGH (ref 0–200)
HDL: 51 mg/dL (ref >39)
Triglycerides: 81 mg/dL (ref <150)
VLDL: 16 mg/dL (ref 0–40)

## 2011-10-30 NOTE — Addendum Note (Signed)
Addended by: Mervin Kung A on: 10/30/2011 09:17 AM   Modules accepted: Orders

## 2011-11-07 ENCOUNTER — Ambulatory Visit: Payer: 59 | Admitting: Internal Medicine

## 2011-11-07 ENCOUNTER — Encounter: Payer: Self-pay | Admitting: Internal Medicine

## 2011-11-07 ENCOUNTER — Ambulatory Visit (INDEPENDENT_AMBULATORY_CARE_PROVIDER_SITE_OTHER): Payer: 59 | Admitting: Internal Medicine

## 2011-11-07 VITALS — BP 118/82 | HR 84 | Temp 98.6°F | Resp 16 | Ht 69.0 in | Wt 211.0 lb

## 2011-11-07 DIAGNOSIS — M109 Gout, unspecified: Secondary | ICD-10-CM

## 2011-11-07 DIAGNOSIS — E785 Hyperlipidemia, unspecified: Secondary | ICD-10-CM

## 2011-11-07 DIAGNOSIS — I10 Essential (primary) hypertension: Secondary | ICD-10-CM

## 2011-11-07 MED ORDER — DICLOFENAC SODIUM 75 MG PO TBEC: 75.0000 mg | DELAYED_RELEASE_TABLET | Freq: Two times a day (BID) | ORAL | Status: DC | PRN

## 2011-11-07 MED ORDER — COLCHICINE 0.6 MG PO TABS: 0.6000 mg | ORAL_TABLET | Freq: Two times a day (BID) | ORAL | Status: DC | PRN

## 2011-11-07 NOTE — Assessment & Plan Note (Signed)
Elevation due to temporary stoppage of medication. Recheck lipid/lft prior to next appt

## 2011-11-07 NOTE — Progress Notes (Signed)
  Subjective:    Patient ID: Bruce Harris, male    DOB: 12-Feb-1971, 41 y.o.   MRN: [id number]  HPI Pt presents to clinic for followup of multiple medical problems. Notes recent gout flares involving right distal foot. May have begun after increasing shellfish/seafood ingestion. Stopped both BP medications after diet/exercise and weight loss. BP has remained normotensive. Was off crestor for ~41month without side effects. Did recently resume medication.   Past Medical History  Diagnosis Date  . Hyperlipidemia   . Hypertension   . Cardiomyopathy   . Gout    Past Surgical History  Procedure Date  . No past surgeries   . Hand surgery     reports that he has never smoked. He has never used smokeless tobacco. He reports that he drinks alcohol. He reports that he does not use illicit drugs. family history includes Coronary artery disease in an unspecified family member; Diabetes in an unspecified family member; Hyperlipidemia in an unspecified family member; and Hypertension in an unspecified family member. No Known Allergies   Review of Systems see hpi     Objective:   Physical Exam  Physical Exam  Nursing note and vitals reviewed. Constitutional: Appears well-developed and well-nourished. No distress.  HENT:  Head: Normocephalic and atraumatic.  Right Ear: External ear normal.  Left Ear: External ear normal.  Eyes: Conjunctivae are normal. No scleral icterus.  Neck: Neck supple. Carotid bruit is not present.  Cardiovascular: Normal rate, regular rhythm and normal heart sounds.  Exam reveals no gallop and no friction rub.   No murmur heard. Pulmonary/Chest: Effort normal and breath sounds normal. No respiratory distress. He has no wheezes. no rales.  Lymphadenopathy:    He has no cervical adenopathy.  Neurological:Alert.  Skin: Skin is warm and dry. Not diaphoretic.  Psychiatric: Has a normal mood and affect.        Assessment & Plan:

## 2011-11-07 NOTE — Patient Instructions (Signed)
Please schedule fasting labs prior to next visit Uric acid-gout, chem7, a1c-hyperglycemia, lipid/lft-272.4

## 2011-11-07 NOTE — Assessment & Plan Note (Signed)
Normotensive and stable. Off medication. Continue diet/exercise.  Monitor bp as outpt and followup in clinic as scheduled.

## 2011-11-07 NOTE — Assessment & Plan Note (Signed)
Diet changes encouraged. Attempt colcrys and voltaren prn. Followup if no improvement or worsening.

## 2011-12-24 ENCOUNTER — Encounter: Payer: Self-pay | Admitting: Internal Medicine

## 2011-12-24 ENCOUNTER — Ambulatory Visit (INDEPENDENT_AMBULATORY_CARE_PROVIDER_SITE_OTHER): Payer: 59 | Admitting: Internal Medicine

## 2011-12-24 VITALS — BP 128/96 | HR 79 | Temp 98.5°F | Resp 16 | Wt 208.8 lb

## 2011-12-24 DIAGNOSIS — J069 Acute upper respiratory infection, unspecified: Secondary | ICD-10-CM

## 2011-12-24 MED ORDER — AZITHROMYCIN 250 MG PO TABS: ORAL_TABLET | ORAL | Status: AC

## 2011-12-24 NOTE — Progress Notes (Signed)
  Subjective:    Patient ID: Bruce Harris, male    DOB: Jan 25, 1971, 41 y.o.   MRN: [id number]  HPI Pt presents to clinic for evaluation of sore throat. Notes six-day history of sore throat, nasal drainage and generalized malaise. No fever chills or known sick exposure. Has been taking over-the-counter medication for cold without improvement. No other alleviating or exacerbating factors.  Past Medical History  Diagnosis Date  . Hyperlipidemia   . Hypertension   . Cardiomyopathy   . Gout    Past Surgical History  Procedure Date  . No past surgeries   . Hand surgery     reports that he has never smoked. He has never used smokeless tobacco. He reports that he drinks alcohol. He reports that he does not use illicit drugs. family history includes Coronary artery disease in an unspecified family member; Diabetes in an unspecified family member; Hyperlipidemia in an unspecified family member; and Hypertension in an unspecified family member. No Known Allergies   Review of Systems see hpi     Objective:   Physical Exam  Constitutional: He appears well-developed and well-nourished. No distress.  HENT:  Head: Normocephalic and atraumatic.  Right Ear: Tympanic membrane, external ear and ear canal normal.  Left Ear: Tympanic membrane, external ear and ear canal normal.  Nose: Nose normal.  Mouth/Throat: Oropharynx is clear and moist. No oropharyngeal exudate.  Eyes: Conjunctivae normal are normal. No scleral icterus.  Neck: Neck supple.  Pulmonary/Chest: Effort normal and breath sounds normal. No respiratory distress. He has no wheezes. He has no rales.  Lymphadenopathy:    He has no cervical adenopathy.  Skin: Skin is warm and dry. He is not diaphoretic.  Psychiatric: He has a normal mood and affect.          Assessment & Plan:

## 2011-12-24 NOTE — Assessment & Plan Note (Signed)
Consider possible viral etiology. Continue symptomatic treatment. Given antibiotic to hold. Begin antibiotic if symptoms do not improve after total duration of 8-10 days. Work note provided. Followup if no improvement or worsening.

## 2012-03-05 ENCOUNTER — Telehealth: Payer: Self-pay | Admitting: Internal Medicine

## 2012-03-05 MED ORDER — TADALAFIL 20 MG PO TABS: ORAL_TABLET | ORAL | Status: DC

## 2012-03-05 NOTE — Telephone Encounter (Signed)
Refill-cialis 20mg  tab. Take one tablet by mouth every 36 hours as needed for intercourse. Qty 18 last fill 8.28.13

## 2012-03-05 NOTE — Telephone Encounter (Signed)
Ok #18

## 2012-03-05 NOTE — Telephone Encounter (Signed)
Rx to pharmacy/SLS 

## 2012-03-13 ENCOUNTER — Telehealth: Payer: Self-pay | Admitting: Internal Medicine

## 2012-03-13 MED ORDER — AZITHROMYCIN 250 MG PO TABS: ORAL_TABLET | ORAL | Status: DC

## 2012-03-13 NOTE — Telephone Encounter (Signed)
Patient having sinus pain & pressure, cough & congestion, H/A x1.5 wks without relief from OTC medications; Rx for Zpak to pharmacy/SLS

## 2012-03-13 NOTE — Telephone Encounter (Signed)
?  duration of recent URI or chronic sinus/nasal sx's? Can you get more hx? If 7days or more then ok for zpak if not allergic and no interactions

## 2012-03-13 NOTE — Telephone Encounter (Signed)
LMOM with contact name & number for return call RE: request for Rx & need for further information about symptoms/SLS

## 2012-03-13 NOTE — Telephone Encounter (Signed)
Patient in office with his son.  Would like an rx for his sinus.  Ears hurt and he has drainage and body ache  Still has pressure behind eyes.   Please call in rx to [facility] [address] in [place]

## 2012-05-07 ENCOUNTER — Encounter: Payer: Self-pay | Admitting: Family Medicine

## 2012-05-07 ENCOUNTER — Ambulatory Visit (INDEPENDENT_AMBULATORY_CARE_PROVIDER_SITE_OTHER): Payer: 59 | Admitting: Family Medicine

## 2012-05-07 ENCOUNTER — Ambulatory Visit: Payer: Self-pay | Admitting: Family Medicine

## 2012-05-07 VITALS — BP 142/90 | HR 78 | Temp 98.7°F | Wt 213.0 lb

## 2012-05-07 DIAGNOSIS — J019 Acute sinusitis, unspecified: Secondary | ICD-10-CM

## 2012-05-07 DIAGNOSIS — J020 Streptococcal pharyngitis: Secondary | ICD-10-CM

## 2012-05-07 LAB — POCT RAPID STREP A (OFFICE): Rapid Strep A Screen: NEGATIVE

## 2012-05-07 MED ORDER — AMOXICILLIN 875 MG PO TABS
875.0000 mg | ORAL_TABLET | Freq: Two times a day (BID) | ORAL | Status: DC
Start: 2012-05-07 — End: 2012-05-19

## 2012-05-07 MED ORDER — MOMETASONE FUROATE 50 MCG/ACT NA SUSP: 2.0000 | Freq: Every day | NASAL | Status: DC

## 2012-05-07 NOTE — Addendum Note (Signed)
Addended by: Arnette Norris on: 05/07/2012 04:37 PM   Modules accepted: Orders

## 2012-05-07 NOTE — Patient Instructions (Signed)

## 2012-05-07 NOTE — Progress Notes (Signed)
  Subjective:     Bruce Harris is a 42 y.o. male who presents for evaluation of sore throat. Associated symptoms include nasal blockage, pain while swallowing, post nasal drip, sinus and nasal congestion, sore throat, swollen glands and white spots in throat. Onset of symptoms was 4 days ago, and have been gradually worsening since that time. He is drinking plenty of fluids. He has not had a recent close exposure to someone with proven streptococcal pharyngitis.  The following portions of the patient's history were reviewed and updated as appropriate: allergies, current medications, past family history, past medical history, past social history, past surgical history and problem list.  Review of Systems Pertinent items are noted in HPI.    Objective:    BP 142/90  Pulse 78  Temp(Src) 98.7 F (37.1 C) (Oral)  Wt 213 lb (96.616 kg)  BMI 31.44 kg/m2  SpO2 96% General appearance: alert, cooperative, appears stated age and no distress Ears: normal TM's and external ear canals both ears Nose: green discharge, mild congestion, turbinates red, swollen, edematous, sinus tenderness bilateral Throat: abnormal findings: exudates present, marked oropharyngeal erythema and tonsils enlarged Neck: mild anterior cervical adenopathy, supple, symmetrical, trachea midline and thyroid not enlarged, symmetric, no tenderness/mass/nodules Lungs: clear to auscultation bilaterally Heart: S1, S2 normal  Laboratory Strep test done. Results:negative.   flu neg Assessment:    Acute pharyngitis, ----uri, fever and body aches   Plan:    Patient placed on antibiotics. Use of OTC analgesics recommended as well as salt water gargles. Follow up as needed.

## 2012-05-08 ENCOUNTER — Ambulatory Visit: Payer: Self-pay | Admitting: Internal Medicine

## 2012-05-19 ENCOUNTER — Telehealth: Payer: Self-pay | Admitting: Internal Medicine

## 2012-05-19 ENCOUNTER — Ambulatory Visit (INDEPENDENT_AMBULATORY_CARE_PROVIDER_SITE_OTHER): Payer: 59 | Admitting: Family Medicine

## 2012-05-19 ENCOUNTER — Other Ambulatory Visit: Payer: Self-pay | Admitting: Family Medicine

## 2012-05-19 ENCOUNTER — Encounter: Payer: Self-pay | Admitting: Family Medicine

## 2012-05-19 VITALS — BP 142/94 | HR 81 | Temp 98.1°F | Ht 69.0 in | Wt 215.0 lb

## 2012-05-19 DIAGNOSIS — Z862 Personal history of diseases of the blood and blood-forming organs and certain disorders involving the immune mechanism: Secondary | ICD-10-CM

## 2012-05-19 DIAGNOSIS — E785 Hyperlipidemia, unspecified: Secondary | ICD-10-CM

## 2012-05-19 DIAGNOSIS — R5383 Other fatigue: Secondary | ICD-10-CM

## 2012-05-19 DIAGNOSIS — I10 Essential (primary) hypertension: Secondary | ICD-10-CM

## 2012-05-19 DIAGNOSIS — R7309 Other abnormal glucose: Secondary | ICD-10-CM

## 2012-05-19 DIAGNOSIS — R739 Hyperglycemia, unspecified: Secondary | ICD-10-CM

## 2012-05-19 DIAGNOSIS — Z8639 Personal history of other endocrine, nutritional and metabolic disease: Secondary | ICD-10-CM

## 2012-05-19 DIAGNOSIS — E291 Testicular hypofunction: Secondary | ICD-10-CM

## 2012-05-19 LAB — CBC
HCT: 42.6 % (ref 39.0–52.0)
Hemoglobin: 13.9 g/dL (ref 13.0–17.0)
MCV: 77.3 fL — ABNORMAL LOW (ref 78.0–100.0)
RBC: 5.51 MIL/uL (ref 4.22–5.81)
RDW: 14.7 % (ref 11.5–15.5)
WBC: 5.7 10*3/uL (ref 4.0–10.5)

## 2012-05-19 LAB — BASIC METABOLIC PANEL
BUN: 19 mg/dL (ref 6–23)
Calcium: 9.5 mg/dL (ref 8.4–10.5)
Creat: 1.28 mg/dL (ref 0.50–1.35)

## 2012-05-19 LAB — TESTOSTERONE: Testosterone: 241 ng/dL — ABNORMAL LOW (ref 300–890)

## 2012-05-19 LAB — HEMOGLOBIN A1C: Mean Plasma Glucose: 123 mg/dL — ABNORMAL HIGH (ref <117)

## 2012-05-19 LAB — HEPATIC FUNCTION PANEL
AST: 25 U/L (ref 0–37)
Albumin: 4.4 g/dL (ref 3.5–5.2)
Bilirubin, Direct: 0.1 mg/dL (ref 0.0–0.3)
Total Bilirubin: 0.3 mg/dL (ref 0.3–1.2)

## 2012-05-19 LAB — LIPID PANEL
LDL Cholesterol: 139 mg/dL — ABNORMAL HIGH (ref 0–99)
Total CHOL/HDL Ratio: 4.7 Ratio

## 2012-05-19 LAB — PHOSPHORUS: Phosphorus: 3.9 mg/dL (ref 2.3–4.6)

## 2012-05-19 MED ORDER — ROSUVASTATIN CALCIUM 20 MG PO TABS: 20.0000 mg | ORAL_TABLET | Freq: Every day | ORAL | Status: DC

## 2012-05-19 NOTE — Telephone Encounter (Signed)
Lab order week of 09-07-2012 hbga1c lipid liver renal cbc tsh

## 2012-05-19 NOTE — Assessment & Plan Note (Signed)
Tolerating Crestor, avoid trans fats and consider Krill oil caps

## 2012-05-19 NOTE — Assessment & Plan Note (Signed)
Minimize simple carbs and consider DASH diet.

## 2012-05-19 NOTE — Assessment & Plan Note (Signed)
mild

## 2012-05-19 NOTE — Assessment & Plan Note (Signed)
Repeat testosterone level with next blood draw, not previously treated.

## 2012-05-19 NOTE — Patient Instructions (Addendum)
Labs prior hgba1c, lipid, liver, renal, cbc, tsh  DASH Diet The DASH diet stands for "Dietary Approaches to Stop Hypertension." It is a healthy eating plan that has been shown to reduce high blood pressure (hypertension) in as little as 14 days, while also possibly providing other significant health benefits. These other health benefits include reducing the risk of breast cancer after menopause and reducing the risk of type 2 diabetes, heart disease, colon cancer, and stroke. Health benefits also include weight loss and slowing kidney failure in patients with chronic kidney disease.  DIET GUIDELINES  Limit salt (sodium). Your diet should contain less than 1500 mg of sodium daily.  Limit refined or processed carbohydrates. Your diet should include mostly whole grains. Desserts and added sugars should be used sparingly.  Include small amounts of heart-healthy fats. These types of fats include nuts, oils, and tub margarine. Limit saturated and trans fats. These fats have been shown to be harmful in the body. CHOOSING FOODS  The following food groups are based on a 2000 calorie diet. See your Registered Dietitian for individual calorie needs. Grains and Grain Products (6 to 8 servings daily)  Eat More Often: Whole-wheat bread, brown rice, whole-grain or wheat pasta, quinoa, popcorn without added fat or salt (air popped).  Eat Less Often: White bread, white pasta, white rice, cornbread. Vegetables (4 to 5 servings daily)  Eat More Often: Fresh, frozen, and canned vegetables. Vegetables may be raw, steamed, roasted, or grilled with a minimal amount of fat.  Eat Less Often/Avoid: Creamed or fried vegetables. Vegetables in a cheese sauce. Fruit (4 to 5 servings daily)  Eat More Often: All fresh, canned (in natural juice), or frozen fruits. Dried fruits without added sugar. One hundred percent fruit juice ( cup [237 mL] daily).  Eat Less Often: Dried fruits with added sugar. Canned fruit in light  or heavy syrup. [employer], Fish, and Poultry (2 servings or less daily. One serving is 3 to 4 oz [85-114 g]).  Eat More Often: Ninety percent or leaner ground beef, tenderloin, sirloin. Round cuts of beef, chicken breast, [place] breast. All fish. Grill, bake, or broil your meat. Nothing should be fried.  Eat Less Often/Avoid: Fatty cuts of meat, [place], or chicken leg, thigh, or wing. Fried cuts of meat or fish. Dairy (2 to 3 servings)  Eat More Often: Low-fat or fat-free milk, low-fat plain or light yogurt, reduced-fat or part-skim cheese.  Eat Less Often/Avoid: Milk (whole, 2%).Whole milk yogurt. Full-fat cheeses. Nuts, Seeds, and Legumes (4 to 5 servings per week)  Eat More Often: All without added salt.  Eat Less Often/Avoid: Salted nuts and seeds, canned beans with added salt. Fats and Sweets (limited)  Eat More Often: Vegetable oils, tub margarines without trans fats, sugar-free gelatin. Mayonnaise and salad dressings.  Eat Less Often/Avoid: Coconut oils, palm oils, butter, stick margarine, cream, half and half, cookies, candy, pie. FOR MORE INFORMATION The Dash Diet Eating Plan: www.dashdiet.org Document Released: 02/07/2011 Document Revised: 05/13/2011 Document Reviewed: 02/07/2011 [facility] Patient Information 2013 [place], [place]. Hypertension As your heart beats, it forces blood through your arteries. This force is your blood pressure. If the pressure is too high, it is called hypertension (HTN) or high blood pressure. HTN is dangerous because you may have it and not know it. High blood pressure may mean that your heart has to work harder to pump blood. Your arteries may be narrow or stiff. The extra work puts you at risk for heart disease, stroke, and other  problems.  Blood pressure consists of two numbers, a higher number over a lower, 110/72, for example. It is stated as "110 over 72." The ideal is below 120 for the top number (systolic) and under 80 for the bottom  (diastolic). Write down your blood pressure today. You should pay close attention to your blood pressure if you have certain conditions such as:  Heart failure.  Prior heart attack.  Diabetes  Chronic kidney disease.  Prior stroke.  Multiple risk factors for heart disease. To see if you have HTN, your blood pressure should be measured while you are seated with your arm held at the level of the heart. It should be measured at least twice. A one-time elevated blood pressure reading (especially in the Emergency Department) does not mean that you need treatment. There may be conditions in which the blood pressure is different between your right and left arms. It is important to see your caregiver soon for a recheck. Most people have essential hypertension which means that there is not a specific cause. This type of high blood pressure may be lowered by changing lifestyle factors such as:  Stress.  Smoking.  Lack of exercise.  Excessive weight.  Drug/tobacco/alcohol use.  Eating less salt. Most people do not have symptoms from high blood pressure until it has caused damage to the body. Effective treatment can often prevent, delay or reduce that damage. TREATMENT  When a cause has been identified, treatment for high blood pressure is directed at the cause. There are a large number of medications to treat HTN. These fall into several categories, and your caregiver will help you select the medicines that are best for you. Medications may have side effects. You should review side effects with your caregiver. If your blood pressure stays high after you have made lifestyle changes or started on medicines,   Your medication(s) may need to be changed.  Other problems may need to be addressed.  Be certain you understand your prescriptions, and know how and when to take your medicine.  Be sure to follow up with your caregiver within the time frame advised (usually within two weeks) to have  your blood pressure rechecked and to review your medications.  If you are taking more than one medicine to lower your blood pressure, make sure you know how and at what times they should be taken. Taking two medicines at the same time can result in blood pressure that is too low. SEEK IMMEDIATE MEDICAL CARE IF:  You develop a severe headache, blurred or changing vision, or confusion.  You have unusual weakness or numbness, or a faint feeling.  You have severe chest or abdominal pain, vomiting, or breathing problems. MAKE SURE YOU:   Understand these instructions.  Will watch your condition.  Will get help right away if you are not doing well or get worse. Document Released: 02/18/2005 Document Revised: 05/13/2011 Document Reviewed: 10/09/2007 [facility] Patient Information 2013 [place], [place].

## 2012-05-19 NOTE — Progress Notes (Signed)
Patient ID: Bruce Harris, male   DOB: [birth year], 42 y.o.   MRN: [id number] Bruce Harris [id number] 1970/03/15 05/19/2012      Progress Note-Follow Up  Subjective  Chief Complaint  Chief Complaint  Patient presents with  . Follow-up    HPI  Patient is a 42 yo AA male in today for follow up. He generally feels well. No recent illness, fevers, HA, CP, palp, SOB, GI or GU c/o. Has been trying to loose weight and exercise more. He does feel this is helping him feel better. Allergies and gout have not flared recently  Past Medical History  Diagnosis Date  . Hyperlipidemia   . Hypertension   . Cardiomyopathy   . Gout     Past Surgical History  Procedure Laterality Date  . No past surgeries    . Hand surgery      Family History  Problem Relation Age of Onset  . Coronary artery disease    . Diabetes    . Hyperlipidemia    . Hypertension      History   Social History  . Marital Status: Married    Spouse Name: N/A    Number of Children: N/A  . Years of Education: N/A   Occupational History  . Not on file.   Social History Main Topics  . Smoking status: Never Smoker   . Smokeless tobacco: Never Used  . Alcohol Use: Yes     Comment: socail   . Drug Use: No  . Sexually Active: Not on file   Other Topics Concern  . Not on file   Social History Narrative     Married - 3 children      Never Smoked      Alcohol use-yes  (social)      Occupation:  works for [employer]              Current Outpatient Prescriptions on File Prior to Visit  Medication Sig Dispense Refill  . EPINEPHrine (EPIPEN) 0.3 mg/0.3 mL DEVI Inject 0.3 mLs (0.3 mg total) into the muscle once.  1 Device  6  . tadalafil (CIALIS) 20 MG tablet One by mouth every 36 hours as needed for intercourse  18 tablet  0   No current facility-administered medications on file prior to visit.    No Known Allergies  Review of Systems  Review of Systems  Constitutional: Negative for  fever and malaise/fatigue.  HENT: Negative for congestion.   Eyes: Negative for discharge.  Respiratory: Negative for shortness of breath.   Cardiovascular: Negative for chest pain, palpitations and leg swelling.  Gastrointestinal: Negative for nausea, abdominal pain and diarrhea.  Genitourinary: Negative for dysuria.  Musculoskeletal: Negative for falls.  Skin: Negative for rash.  Neurological: Negative for loss of consciousness and headaches.  Endo/Heme/Allergies: Negative for polydipsia.  Psychiatric/Behavioral: Negative for depression and suicidal ideas. The patient is not nervous/anxious and does not have insomnia.     Objective  BP 142/94  Pulse 81  Temp(Src) 98.1 F (36.7 C) (Oral)  Ht 5\' 9"  (1.753 m)  Wt 215 lb (97.523 kg)  BMI 31.74 kg/m2  SpO2 98%  Physical Exam  Physical Exam  Constitutional: He is oriented to person, place, and time and well-developed, well-nourished, and in no distress. No distress.  HENT:  Head: Normocephalic and atraumatic.  Eyes: Conjunctivae are normal.  Neck: Neck supple. No thyromegaly present.  Cardiovascular: Normal rate, regular rhythm and normal heart sounds.   No  murmur heard. Pulmonary/Chest: Effort normal and breath sounds normal. No respiratory distress.  Abdominal: He exhibits no distension and no mass. There is no tenderness.  Musculoskeletal: He exhibits no edema.  Neurological: He is alert and oriented to person, place, and time.  Skin: Skin is warm.  Psychiatric: Memory, affect and judgment normal.    Lab Results  Component Value Date   TSH 0.92 05/08/2011   Lab Results  Component Value Date   WBC 5.7 05/19/2012   HGB 13.9 05/19/2012   HCT 42.6 05/19/2012   MCV 77.3* 05/19/2012   PLT 261 05/19/2012   Lab Results  Component Value Date   CREATININE 1.15 10/30/2011   BUN 19 10/30/2011   NA 142 10/30/2011   K 4.0 10/30/2011   CL 108 10/30/2011   CO2 26 10/30/2011   Lab Results  Component Value Date   ALT 16 10/30/2011    AST 27 10/30/2011   ALKPHOS 56 10/30/2011   BILITOT 0.6 10/30/2011   Lab Results  Component Value Date   CHOL 250* 10/30/2011   Lab Results  Component Value Date   HDL 51 10/30/2011   Lab Results  Component Value Date   LDLCALC 183* 10/30/2011   Lab Results  Component Value Date   TRIG 81 10/30/2011   Lab Results  Component Value Date   CHOLHDL 4.9 10/30/2011     Assessment & Plan  HYPERTENSION Mild elevation, encouraged DASH diet and continue regular exercise. Report any concerning symptoms  HYPOGONADISM Repeat testosterone level with next blood draw, not previously treated.  Fatigue mild  HYPERLIPIDEMIA Tolerating Crestor, avoid trans fats and consider Krill oil caps  HYPERGLYCEMIA Minimize simple carbs and consider DASH diet.

## 2012-05-19 NOTE — Assessment & Plan Note (Signed)
Mild elevation, encouraged DASH diet and continue regular exercise. Report any concerning symptoms

## 2012-06-23 ENCOUNTER — Encounter: Payer: Self-pay | Admitting: Family

## 2012-06-23 ENCOUNTER — Ambulatory Visit (INDEPENDENT_AMBULATORY_CARE_PROVIDER_SITE_OTHER): Payer: 59 | Admitting: Family

## 2012-06-23 VITALS — BP 132/100 | HR 82 | Temp 98.2°F | Resp 16 | Ht 69.0 in | Wt 211.0 lb

## 2012-06-23 DIAGNOSIS — J029 Acute pharyngitis, unspecified: Secondary | ICD-10-CM

## 2012-06-23 DIAGNOSIS — H6692 Otitis media, unspecified, left ear: Secondary | ICD-10-CM

## 2012-06-23 DIAGNOSIS — H6690 Otitis media, unspecified, unspecified ear: Secondary | ICD-10-CM

## 2012-06-23 MED ORDER — AMOXICILLIN 500 MG PO CAPS: 1000.0000 mg | ORAL_CAPSULE | Freq: Two times a day (BID) | ORAL | Status: DC

## 2012-06-23 NOTE — Progress Notes (Signed)
  Subjective:    Patient ID: Bruce Harris, male    DOB: 26-Apr-1970, 42 y.o.   MRN: [id number]  HPI  Mr.  Harris is a 42 yr old male who presents today with chief complaint of sore throat. Sore throat has been present x 1 week and is associated with bilateral ear pain, headache, nasal congestion and post nasal drip. He has tried multiple otc preps without improvement.    Review of Systems See HPI  Past Medical History  Diagnosis Date  . Hyperlipidemia   . Hypertension   . Cardiomyopathy   . Gout     History   Social History  . Marital Status: Married    Spouse Name: N/A    Number of Children: N/A  . Years of Education: N/A   Occupational History  . Not on file.   Social History Main Topics  . Smoking status: Never Smoker   . Smokeless tobacco: Never Used  . Alcohol Use: Yes     Comment: socail   . Drug Use: No  . Sexually Active: Not on file   Other Topics Concern  . Not on file   Social History Narrative     Married - 3 children      Never Smoked      Alcohol use-yes  (social)      Occupation:  works for [employer]              Past Surgical History  Procedure Laterality Date  . No past surgeries    . Hand surgery      Family History  Problem Relation Age of Onset  . Coronary artery disease    . Diabetes    . Hyperlipidemia    . Hypertension      No Known Allergies  Current Outpatient Prescriptions on File Prior to Visit  Medication Sig Dispense Refill  . EPINEPHrine (EPIPEN) 0.3 mg/0.3 mL DEVI Inject 0.3 mLs (0.3 mg total) into the muscle once.  1 Device  6  . rosuvastatin (CRESTOR) 20 MG tablet Take 1 tablet (20 mg total) by mouth daily.  90 tablet  1  . tadalafil (CIALIS) 20 MG tablet One by mouth every 36 hours as needed for intercourse  18 tablet  0   No current facility-administered medications on file prior to visit.    BP 132/100  Pulse 82  Temp(Src) 98.2 F (36.8 C) (Oral)  Resp 16  Ht 5\' 9"  (1.753 m)  Wt 211 lb  (95.709 kg)  BMI 31.15 kg/m2  SpO2 98%       Objective:   Physical Exam  Constitutional: He is oriented to person, place, and time. He appears well-developed and well-nourished. No distress.  HENT:  Head: Normocephalic and atraumatic.  Right Ear: Tympanic membrane and ear canal normal.  L TM with streaking erythema at 1 oclock, no bulging Mild oropharyngeal erythema without exudate.  Cardiovascular: Normal rate and regular rhythm.   No murmur heard. Pulmonary/Chest: Effort normal and breath sounds normal. No respiratory distress. He has no wheezes. He has no rales. He exhibits no tenderness.  Neurological: He is alert and oriented to person, place, and time.          Assessment & Plan:

## 2012-06-23 NOTE — Patient Instructions (Signed)
Please call if symptoms worsen or if not improved in 2-3 days.   

## 2012-06-23 NOTE — Assessment & Plan Note (Signed)
Suspect early left otitis media.  Will rx with amoxicillin. Rapid strep is negative.

## 2012-07-16 ENCOUNTER — Telehealth: Payer: Self-pay

## 2012-07-16 NOTE — Telephone Encounter (Signed)
Patient left a message stating that he would like someone to call him back to discuss his lab results from a few weeks ago.

## 2012-07-20 NOTE — Telephone Encounter (Signed)
Patient called in about labs he had done on 05/19/12. Message from 05/19/12 visit (Notify sugar good, cholesterol is actually slightly better. Testosterone is down slightly but it is not absolutely necessary to treat it. If you get more tired we could treat it.)  Patient stated that his tiredness has gotten worse and he would like to have something called in. Please advise?

## 2012-07-20 NOTE — Telephone Encounter (Signed)
So it has been 2 months if he has gotten worse and we are going to start a risky med we should redraw just his testosterone so we know what baseline we are starting from then warn him that this med can increase his risk of MI, stroke, blood clot so we have to monitor it closely with labs as we move forward. The cheapest route is shots he would have to come in for appt to get started or we can try androgel topical gel although sometimes insurance pushes back on this one.

## 2012-07-21 NOTE — Telephone Encounter (Signed)
Left a detailed message on pts vm to return my call

## 2012-07-21 NOTE — Telephone Encounter (Signed)
Left message for patient to return call.

## 2012-07-21 NOTE — Telephone Encounter (Signed)
Patient stated that he will come in for testosterone lab. He is willing to start treatment.

## 2012-07-21 NOTE — Telephone Encounter (Signed)
So please order a testosterone lab for this patient and then I need to know if he wants to start a topical or a shot

## 2012-08-12 ENCOUNTER — Other Ambulatory Visit: Payer: Self-pay | Admitting: Internal Medicine

## 2012-08-12 MED ORDER — TADALAFIL 20 MG PO TABS: ORAL_TABLET | ORAL | Status: DC

## 2012-08-12 NOTE — Telephone Encounter (Signed)
Refill- cialis 20mg  tab. Take one tablet by mouth every 36hrs as needed for intercourse. Qty 18 last fill 3.6.14

## 2012-08-12 NOTE — Telephone Encounter (Signed)
Please advise Cialis refill?  

## 2012-09-17 ENCOUNTER — Ambulatory Visit: Payer: Self-pay | Admitting: Family Medicine

## 2012-09-17 DIAGNOSIS — Z0289 Encounter for other administrative examinations: Secondary | ICD-10-CM

## 2013-01-22 ENCOUNTER — Ambulatory Visit (INDEPENDENT_AMBULATORY_CARE_PROVIDER_SITE_OTHER): Payer: 59 | Admitting: Physician Assistant

## 2013-01-22 ENCOUNTER — Encounter: Payer: Self-pay | Admitting: Physician Assistant

## 2013-01-22 VITALS — BP 152/100 | HR 79 | Temp 98.2°F | Resp 18 | Ht 69.0 in | Wt 216.8 lb

## 2013-01-22 DIAGNOSIS — I10 Essential (primary) hypertension: Secondary | ICD-10-CM

## 2013-01-22 DIAGNOSIS — J019 Acute sinusitis, unspecified: Secondary | ICD-10-CM | POA: Insufficient documentation

## 2013-01-22 MED ORDER — LISINOPRIL 10 MG PO TABS: 10.0000 mg | ORAL_TABLET | Freq: Every day | ORAL | Status: DC

## 2013-01-22 MED ORDER — AMOXICILLIN-POT CLAVULANATE 875-125 MG PO TABS: 1.0000 | ORAL_TABLET | Freq: Two times a day (BID) | ORAL | Status: DC

## 2013-01-22 NOTE — Progress Notes (Signed)
Pre visit review using our clinic review tool, if applicable. No additional management support is needed unless otherwise documented below in the visit note/SLS  

## 2013-01-22 NOTE — Assessment & Plan Note (Signed)
Rx Augmentin.  Increase fluid intake.  Rest.  Saline nasal spray.  Claritin.  Avoid products with D/DM due to HTN.  Humidifier in bedroom.  Ibuprofen or Tylenol for pain.

## 2013-01-22 NOTE — Progress Notes (Signed)
Patient ID: Bruce Harris, male   DOB: Sep 27, 1970, 42 y.o.   MRN: [id number]  Patient presents to clinic today c/o sinus pressure, sinus pain, headache, tooth pain x 2 weeks.  Patient denies fever, chills, aches.  Endorses intermittent L ear pain.  Denies ear discharge or tinnitus.  Denies history of asthma but endorses allergies.  Denies recent sick contact.  Of note, BP in clinic is 152/100.  Patient with history of HTN requiring dual therapy in the past.  Patient was taken off of BP meds after making significant lifestyle changes and BP became normotensive.  BP has been steadily creeping up since 12/2011.  BP at last visit was in 140s/100s.  Patient denies chest pain, vision changes, LH, palpitations.  Endorses poor diet and lack of exercise.   Past Medical History  Diagnosis Date  . Hyperlipidemia   . Hypertension   . Cardiomyopathy   . Gout     Current Outpatient Prescriptions on File Prior to Visit  Medication Sig Dispense Refill  . EPINEPHrine (EPIPEN) 0.3 mg/0.3 mL DEVI Inject 0.3 mLs (0.3 mg total) into the muscle once.  1 Device  6  . rosuvastatin (CRESTOR) 20 MG tablet Take 1 tablet (20 mg total) by mouth daily.  90 tablet  1  . tadalafil (CIALIS) 20 MG tablet One by mouth every 36 hours as needed for intercourse  18 tablet  0   No current facility-administered medications on file prior to visit.    No Known Allergies  Family History  Problem Relation Age of Onset  . Coronary artery disease    . Diabetes    . Hyperlipidemia    . Hypertension      History   Social History  . Marital Status: Married    Spouse Name: N/A    Number of Children: N/A  . Years of Education: N/A   Social History Main Topics  . Smoking status: Never Smoker   . Smokeless tobacco: Never Used  . Alcohol Use: Yes     Comment: socail   . Drug Use: No  . Sexual Activity: None   Other Topics Concern  . None   Social History Narrative     Married - 3 children      Never Smoked   Alcohol use-yes  (social)      Occupation:  works for [employer]             ROS See HPI.  All other ROS are negative.  Filed Vitals:   01/22/13 1337  BP: 152/100  Pulse: 79  Temp: 98.2 F (36.8 C)  Resp: 18   Physical Exam  Vitals reviewed. Constitutional: He is oriented to person, place, and time and well-developed, well-nourished, and in no distress.  HENT:  Head: Normocephalic and atraumatic.  Right Ear: External ear normal.  Left Ear: External ear normal.  Nose: Nose normal.  Mouth/Throat: Oropharynx is clear and moist. No oropharyngeal exudate.  R tympanic membrane within normal limits.  L tympanic membrane is dull and slightly erythematous without bulging.  Tenderness to palpation over L maxillary and frontal sinuses.  Eyes: Conjunctivae and EOM are normal.  Neck: Neck supple.  Cardiovascular: Normal rate, regular rhythm, normal heart sounds and intact distal pulses.   Pulmonary/Chest: Effort normal and breath sounds normal. No respiratory distress. He has no wheezes. He has no rales. He exhibits no tenderness.  Lymphadenopathy:    He has no cervical adenopathy.  Neurological: He is alert and oriented  to person, place, and time.  Skin: Skin is warm and dry. No rash noted.  Psychiatric: Affect normal.   Assessment/Plan: No problem-specific assessment & plan notes found for this encounter.

## 2013-01-22 NOTE — Assessment & Plan Note (Signed)
Persistently elevated BP.  DASH handout given.  Restart Lisinopril 10 mg Qd.  Follow-up in 2 weeks.

## 2013-01-22 NOTE — Patient Instructions (Addendum)
Please take antibiotic as prescribed until all tablets are gone.  Increase fluid intake.  Saline nasal spray.  Rest.  Coricedin HBP.  Can use plain mucinex.  Please call or return if symptoms are not improving.  For Blood pressure, take lisinopril daily.  Read information below.  i want to see you in ~ 2 weeks to reassess BP.

## 2013-02-08 ENCOUNTER — Ambulatory Visit: Payer: Self-pay | Admitting: Physician Assistant

## 2013-02-10 ENCOUNTER — Ambulatory Visit: Payer: Self-pay | Admitting: Physician Assistant

## 2013-02-10 DIAGNOSIS — Z0289 Encounter for other administrative examinations: Secondary | ICD-10-CM

## 2013-05-26 ENCOUNTER — Telehealth: Payer: Self-pay

## 2013-05-26 NOTE — Telephone Encounter (Signed)
Flonase is now over-the-counter at prescription strength.  If he needs a prescription because he has met his deductible and the Rx would make Flonase free, I will gladly do so.  I do not call in antibiotics without seeing the patient.  He will need an office visit to assess his symptoms and for examination prior to medication selection.  The patient has seen multiple providers since Dr. Elizebeth Koller left.  He will need to select one of [name] as his PCP in order to ensure good continuity of care.

## 2013-05-26 NOTE — Telephone Encounter (Signed)
Informed the patient of instructions.  The patient did agree to schedule appointment for a possible sinus infection .  Informed also to select a PCP at that time as well.

## 2013-05-26 NOTE — Telephone Encounter (Signed)
The patient called this morning requesting a refill on flonase.  Also, states would like something for a sinus infection.  Patient was not sure who PCP was at this time as has been seen by several different providers over the past year.  Advise please on request or should he be offered OV?

## 2013-05-27 ENCOUNTER — Ambulatory Visit: Payer: Self-pay | Admitting: Physician Assistant

## 2013-05-27 NOTE — Telephone Encounter (Signed)
A user error has taken place: encounter opened in error, closed for administrative reasons.

## 2013-06-02 ENCOUNTER — Ambulatory Visit: Payer: 59 | Admitting: Physician Assistant

## 2013-06-02 DIAGNOSIS — Z0289 Encounter for other administrative examinations: Secondary | ICD-10-CM

## 2013-07-02 ENCOUNTER — Telehealth: Payer: Self-pay | Admitting: Internal Medicine

## 2013-07-02 NOTE — Telephone Encounter (Signed)
Refill cialis

## 2013-07-02 NOTE — Telephone Encounter (Signed)
Left message for patient to return my call.

## 2013-07-02 NOTE — Telephone Encounter (Signed)
Patient needs to pick a provider? And he is no showed and cancelled 4 times per [facility], so is that a termination? And pt would need an appt to be seen its been over a year. No refills

## 2013-07-06 NOTE — Telephone Encounter (Signed)
Left message for patient to return my call.

## 2013-07-08 NOTE — Telephone Encounter (Signed)
Left message for patient to return my call.

## 2013-07-09 ENCOUNTER — Encounter: Payer: Self-pay | Admitting: *Deleted

## 2013-07-13 NOTE — Telephone Encounter (Signed)
Mailed letter to pt

## 2013-08-02 ENCOUNTER — Telehealth: Payer: Self-pay | Admitting: Family Medicine

## 2013-08-02 MED ORDER — ROSUVASTATIN CALCIUM 20 MG PO TABS: 20.0000 mg | ORAL_TABLET | Freq: Every day | ORAL | Status: DC

## 2013-08-02 NOTE — Telephone Encounter (Signed)
Norvasc hasn't been filled since 2012. Pt will need to let us know why he is taking this.  Also a letter was sent by [facility] on 07-09-13 stating that pt would need an appt to continue getting refills?  02-08-13 and 05-27-13 appt cancelled with [facility] 02-10-13 and 06-02-13 appt was no showed with [facility]  I will call [facility] to cancel the 90 day of Crestor RX.  I will send in 30 tabs

## 2013-08-02 NOTE — Telephone Encounter (Signed)
Refill- crestor   Refill- norvasc  [facility] on [address] in [place]

## 2013-10-13 ENCOUNTER — Ambulatory Visit (INDEPENDENT_AMBULATORY_CARE_PROVIDER_SITE_OTHER): Payer: 59 | Admitting: Physician Assistant

## 2013-10-13 ENCOUNTER — Encounter: Payer: Self-pay | Admitting: Physician Assistant

## 2013-10-13 VITALS — BP 162/108 | HR 70 | Temp 98.4°F | Resp 16 | Ht 69.0 in | Wt 214.5 lb

## 2013-10-13 DIAGNOSIS — Z125 Encounter for screening for malignant neoplasm of prostate: Secondary | ICD-10-CM | POA: Insufficient documentation

## 2013-10-13 DIAGNOSIS — N529 Male erectile dysfunction, unspecified: Secondary | ICD-10-CM

## 2013-10-13 DIAGNOSIS — R7309 Other abnormal glucose: Secondary | ICD-10-CM

## 2013-10-13 DIAGNOSIS — Z0000 Encounter for general adult medical examination without abnormal findings: Secondary | ICD-10-CM | POA: Insufficient documentation

## 2013-10-13 DIAGNOSIS — F528 Other sexual dysfunction not due to a substance or known physiological condition: Secondary | ICD-10-CM

## 2013-10-13 DIAGNOSIS — I10 Essential (primary) hypertension: Secondary | ICD-10-CM

## 2013-10-13 DIAGNOSIS — E785 Hyperlipidemia, unspecified: Secondary | ICD-10-CM

## 2013-10-13 DIAGNOSIS — N528 Other male erectile dysfunction: Secondary | ICD-10-CM

## 2013-10-13 LAB — CBC
HCT: 42.9 % (ref 39.0–52.0)
HEMOGLOBIN: 14.2 g/dL (ref 13.0–17.0)
MCH: 26.2 pg (ref 26.0–34.0)
MCHC: 33.1 g/dL (ref 30.0–36.0)
MCV: 79 fL (ref 78.0–100.0)
Platelets: 259 10*3/uL (ref 150–400)
RBC: 5.43 MIL/uL (ref 4.22–5.81)
RDW: 14.7 % (ref 11.5–15.5)
WBC: 4.8 10*3/uL (ref 4.0–10.5)

## 2013-10-13 LAB — HEMOGLOBIN A1C
Hgb A1c MFr Bld: 5.7 % — ABNORMAL HIGH (ref <5.7)
Mean Plasma Glucose: 117 mg/dL — ABNORMAL HIGH (ref <117)

## 2013-10-13 MED ORDER — ROSUVASTATIN CALCIUM 20 MG PO TABS: 20.0000 mg | ORAL_TABLET | Freq: Every day | ORAL | Status: DC

## 2013-10-13 MED ORDER — AMLODIPINE BESYLATE 5 MG PO TABS: 5.0000 mg | ORAL_TABLET | Freq: Every day | ORAL | Status: DC

## 2013-10-13 MED ORDER — LISINOPRIL 10 MG PO TABS
10.0000 mg | ORAL_TABLET | Freq: Every day | ORAL | Status: DC
Start: 2013-10-13 — End: 2014-07-20

## 2013-10-13 MED ORDER — TADALAFIL 20 MG PO TABS: ORAL_TABLET | ORAL | Status: DC

## 2013-10-13 NOTE — Progress Notes (Signed)
Patient presents to clinic today for annual exam.  Patient is fasting for labs.  Acute Concerns: Patient wishes to have his blood sugar and insulin levels checked.  Endorses episodes of nausea, lightheadedness and shakiness that resolve with food intake.  Denies history of diabetes, but does have history of elevated fasting glucose in our EMR system.  Denies polydipsia or polyphagia.  Does endorse large urine output.  Nocturia x 0.  No symptoms at present.  Chronic Issues: Hypertension -- previously controlled with Lisinopril.  BP elevated in clinic today at 162/108.  Patient has not taken medication this AM.  Endorses home BP measurements ranging from 130/80-150/100, while on medication.  Denies chest pain or palpitations, but does note some blurring of vision when BP is elevated.  Hyperlipidemia -- currently on Crestor. Denies myalgias or arthralgias.  Denies history of hepatic impairment. Is fasting for labs.  Erectile Dysfunction -- good results with Cialis PRN. Needs refill of medication.  Is due for PSA screen.   Health Maintenance: Dental -- up-to-date Vision -- overdue Immunizations -- up-to-date   Past Medical History  Diagnosis Date  . Hyperlipidemia   . Hypertension   . Cardiomyopathy   . Gout   . Hyperglycemia   . Toxic effect of venom of bees   . Hypogonadism in male   . Chicken pox     Past Surgical History  Procedure Laterality Date  . Hand surgery      Right    Current Outpatient Prescriptions on File Prior to Visit  Medication Sig Dispense Refill  . EPINEPHrine (EPIPEN) 0.3 mg/0.3 mL DEVI Inject 0.3 mLs (0.3 mg total) into the muscle once.  1 Device  6   No current facility-administered medications on file prior to visit.    Allergies  Allergen Reactions  . Bee Venom Swelling    Family History  Problem Relation Age of Onset  . Coronary artery disease Mother     Living  . Diabetes Mother   . Hyperlipidemia Mother   . Hypertension Mother   .  Hypertension Father     Living  . Heart disease Father   . Diabetes Maternal Grandmother   . Kidney failure Maternal Grandmother   . Healthy Brother     x1  . Healthy Sister     x2  . Healthy Son     x2    History   Social History  . Marital Status: Married    Spouse Name: N/A    Number of Children: N/A  . Years of Education: N/A   Occupational History  . Not on file.   Social History Main Topics  . Smoking status: Never Smoker   . Smokeless tobacco: Never Used  . Alcohol Use: Yes     Comment: socail   . Drug Use: No  . Sexual Activity: Not on file   Other Topics Concern  . Not on file   Social History Narrative     Married - 3 children      Never Smoked      Alcohol use-yes  (social)      Occupation:  works for [employer] [place] [employer]  Constitutional: Negative for fever and weight loss.  HENT: Negative for ear discharge, ear pain, hearing loss and tinnitus.   Eyes: Negative for blurred vision, double vision, photophobia and pain.  Respiratory: Negative for shortness of breath.   Cardiovascular: Negative for  chest pain and palpitations.  Gastrointestinal: Negative for heartburn, nausea, vomiting, abdominal pain, diarrhea, constipation, blood in stool and melena.  Genitourinary: Negative for dysuria, urgency, frequency, hematuria and flank pain.       Nocturia x 0.   Neurological: Negative for dizziness, loss of consciousness and headaches.  Endo/Heme/Allergies: Negative for environmental allergies.  Psychiatric/Behavioral: Negative for depression, suicidal ideas, hallucinations and substance abuse. The patient is not nervous/anxious and does not have insomnia.    BP 162/108  Pulse 70  Temp(Src) 98.4 F (36.9 C) (Oral)  Resp 16  Ht 5\' 9"  (1.753 m)  Wt 214 lb 8 oz (97.297 kg)  BMI 31.66 kg/m2  SpO2 96%  Physical Exam  Vitals reviewed. Constitutional: He is oriented to person, place, and time and well-developed,  well-nourished, and in no distress.  HENT:  Head: Normocephalic and atraumatic.  Right Ear: External ear normal.  Left Ear: External ear normal.  Nose: Nose normal.  Mouth/Throat: Oropharynx is clear and moist. No oropharyngeal exudate.  TM within normal limits bilaterally.  Eyes: Conjunctivae are normal. Pupils are equal, round, and reactive to light.  Neck: Neck supple. No thyromegaly present.  Cardiovascular: Normal rate, normal heart sounds and intact distal pulses.   Pulmonary/Chest: Effort normal and breath sounds normal. No respiratory distress. He has no wheezes. He has no rales. He exhibits no tenderness.  Abdominal: Soft. Bowel sounds are normal. He exhibits no distension and no mass. There is no tenderness. There is no rebound and no guarding.  Genitourinary:  Patient defers DRE.  Musculoskeletal: Normal range of motion.  Lymphadenopathy:    He has no cervical adenopathy.  Neurological: He is alert and oriented to person, place, and time. No cranial nerve deficit.  Skin: Skin is warm and dry. No rash noted.  Psychiatric: Affect normal.   Assessment/Plan: HYPERLIPIDEMIA Will obtain fasting lipids and liver function panel.  Continue current regimen for now.   Labile blood glucose Concern for fluctuating glucose. Will check UA, BMP, A1C and serum insulin level.  Encourage snack or meal every 3-4 hours to keep glucose stable.  Stay well hydrated.   ERECTILE DYSFUNCTION Medication refilled.  HYPERTENSION Continue lisinopril.  Begin Norvasc 5 mg daily. Follow-up in 2 weeks for BP check.   Prostate cancer screening Will obtain PSA.  Visit for preventive health examination Immunizations up-to-date. Will obtain fasting labs at today's visit.

## 2013-10-13 NOTE — Assessment & Plan Note (Signed)
Concern for fluctuating glucose. Will check UA, BMP, A1C and serum insulin level.  Encourage snack or meal every 3-4 hours to keep glucose stable.  Stay well hydrated.

## 2013-10-13 NOTE — Assessment & Plan Note (Signed)
Continue lisinopril.  Begin Norvasc 5 mg daily. Follow-up in 2 weeks for BP check.

## 2013-10-13 NOTE — Patient Instructions (Signed)
Please take medications as directed.  Follow-up in 3-4 weeks so we can recheck your blood pressure. Please take your medication the morning of the appointment.  Please go to lab.  I will call you with all of your results.  We will treat you according to your results if anything comes  Back abnormal.  Hypertension Hypertension, commonly called high blood pressure, is when the force of blood pumping through your arteries is too strong. Your arteries are the blood vessels that carry blood from your heart throughout your body. A blood pressure reading consists of a higher number over a lower number, such as 110/72. The higher number (systolic) is the pressure inside your arteries when your heart pumps. The lower number (diastolic) is the pressure inside your arteries when your heart relaxes. Ideally you want your blood pressure below 120/80. Hypertension forces your heart to work harder to pump blood. Your arteries may become narrow or stiff. Having hypertension puts you at risk for heart disease, stroke, and other problems.  RISK FACTORS Some risk factors for high blood pressure are controllable. Others are not.  Risk factors you cannot control include:   Race. You may be at higher risk if you are African American.  Age. Risk increases with age.  Gender. Men are at higher risk than women before age 87 years. After age 46, women are at higher risk than men. Risk factors you can control include:  Not getting enough exercise or physical activity.  Being overweight.  Getting too much fat, sugar, calories, or salt in your diet.  Drinking too much alcohol. SIGNS AND SYMPTOMS Hypertension does not usually cause signs or symptoms. Extremely high blood pressure (hypertensive crisis) may cause headache, anxiety, shortness of breath, and nosebleed. DIAGNOSIS  To check if you have hypertension, your health care provider will measure your blood pressure while you are seated, with your arm held at the level  of your heart. It should be measured at least twice using the same arm. Certain conditions can cause a difference in blood pressure between your right and left arms. A blood pressure reading that is higher than normal on one occasion does not mean that you need treatment. If one blood pressure reading is high, ask your health care provider about having it checked again. TREATMENT  Treating high blood pressure includes making lifestyle changes and possibly taking medicine. Living a healthy lifestyle can help lower high blood pressure. You may need to change some of your habits. Lifestyle changes may include:  Following the DASH diet. This diet is high in fruits, vegetables, and whole grains. It is low in salt, red meat, and added sugars.  Getting at least 2 hours of brisk physical activity every week.  Losing weight if necessary.  Not smoking.  Limiting alcoholic beverages.  Learning ways to reduce stress. If lifestyle changes are not enough to get your blood pressure under control, your health care provider may prescribe medicine. You may need to take more than one. Work closely with your health care provider to understand the risks and benefits. HOME CARE INSTRUCTIONS  Have your blood pressure rechecked as directed by your health care provider.   Take medicines only as directed by your health care provider. Follow the directions carefully. Blood pressure medicines must be taken as prescribed. The medicine does not work as well when you skip doses. Skipping doses also puts you at risk for problems.   Do not smoke.   Monitor your blood pressure at home as  directed by your health care provider. SEEK MEDICAL CARE IF:   You think you are having a reaction to medicines taken.  You have recurrent headaches or feel dizzy.  You have swelling in your ankles.  You have trouble with your vision. SEEK IMMEDIATE MEDICAL CARE IF:  You develop a severe headache or confusion.  You have  unusual weakness, numbness, or feel faint.  You have severe chest or abdominal pain.  You vomit repeatedly.  You have trouble breathing. MAKE SURE YOU:   Understand these instructions.  Will watch your condition.  Will get help right away if you are not doing well or get worse. Document Released: 02/18/2005 Document Revised: 07/05/2013 Document Reviewed: 12/11/2012 [facility] Patient Information 2015 [place], [place]. This information is not intended to replace advice given to you by your health care provider. Make sure you discuss any questions you have with your health care provider.

## 2013-10-13 NOTE — Assessment & Plan Note (Signed)
Immunizations up-to-date. Will obtain fasting labs at today's visit.

## 2013-10-13 NOTE — Assessment & Plan Note (Signed)
Will obtain PSA

## 2013-10-13 NOTE — Assessment & Plan Note (Signed)
Medication refilled

## 2013-10-13 NOTE — Assessment & Plan Note (Signed)
Will obtain fasting lipids and liver function panel.  Continue current regimen for now.

## 2013-10-13 NOTE — Progress Notes (Signed)
Pre visit review using our clinic review tool, if applicable. No additional management support is needed unless otherwise documented below in the visit note/SLS  

## 2013-10-14 LAB — PSA, TOTAL AND FREE
PSA FREE PCT: 42 % (ref >25)
PSA, Free: 0.25 ng/mL
PSA: 0.59 ng/mL (ref <=4.00)

## 2013-10-14 LAB — TSH: TSH: 0.899 u[IU]/mL (ref 0.350–4.500)

## 2013-10-14 LAB — URINALYSIS, MICROSCOPIC ONLY
Bacteria, UA: NONE SEEN
CRYSTALS: NONE SEEN
Casts: NONE SEEN
Squamous Epithelial / LPF: NONE SEEN

## 2013-10-14 LAB — BASIC METABOLIC PANEL WITH GFR
BUN: 14 mg/dL (ref 6–23)
CALCIUM: 9.6 mg/dL (ref 8.4–10.5)
CHLORIDE: 107 meq/L (ref 96–112)
CO2: 19 mEq/L (ref 19–32)
CREATININE: 1.32 mg/dL (ref 0.50–1.35)
GFR, EST NON AFRICAN AMERICAN: 66 mL/min
GFR, Est African American: 76 mL/min
Glucose, Bld: 94 mg/dL (ref 70–99)
Potassium: 4.2 mEq/L (ref 3.5–5.3)
Sodium: 144 mEq/L (ref 135–145)

## 2013-10-14 LAB — HEPATIC FUNCTION PANEL
ALBUMIN: 4.4 g/dL (ref 3.5–5.2)
ALK PHOS: 56 U/L (ref 39–117)
ALT: 16 U/L (ref 0–53)
AST: 28 U/L (ref 0–37)
BILIRUBIN TOTAL: 0.5 mg/dL (ref 0.2–1.2)
Bilirubin, Direct: 0.1 mg/dL (ref 0.0–0.3)
Indirect Bilirubin: 0.4 mg/dL (ref 0.2–1.2)
TOTAL PROTEIN: 7.6 g/dL (ref 6.0–8.3)

## 2013-10-14 LAB — LIPID PANEL
CHOLESTEROL: 240 mg/dL — AB (ref 0–200)
HDL: 57 mg/dL (ref >39)
LDL Cholesterol: 164 mg/dL — ABNORMAL HIGH (ref 0–99)
Total CHOL/HDL Ratio: 4.2 Ratio
Triglycerides: 95 mg/dL (ref <150)
VLDL: 19 mg/dL (ref 0–40)

## 2013-10-14 LAB — INSULIN, FASTING: INSULIN FASTING, SERUM: 9 u[IU]/mL (ref 3–28)

## 2013-11-10 ENCOUNTER — Ambulatory Visit: Payer: 59 | Admitting: Physician Assistant

## 2013-11-10 ENCOUNTER — Telehealth: Payer: Self-pay | Admitting: *Deleted

## 2013-11-10 DIAGNOSIS — Z0289 Encounter for other administrative examinations: Secondary | ICD-10-CM

## 2013-11-10 NOTE — Telephone Encounter (Signed)
Pt did not show for appointment today, 11/10/2013 at 9:15am, for 4wk Follow Up

## 2013-11-11 NOTE — Telephone Encounter (Signed)
Patient was to come in for a BP recheck. Please call and encourage patient to come in to the office in the next 1-2 weeks, at least for a nurse visit, to have his BP re-examined to make sure it is looking better.  Elevated BP increases risk for stroke, heart attack, etc.

## 2013-11-16 NOTE — Telephone Encounter (Signed)
Left msg 11/11/2013 for pt to call and reschedule.

## 2014-04-25 ENCOUNTER — Other Ambulatory Visit: Payer: Self-pay | Admitting: Physician Assistant

## 2014-04-26 NOTE — Telephone Encounter (Signed)
Medication Detail      Disp Refills Start End     tadalafil (CIALIS) 20 MG tablet 18 tablet 0 10/13/2013 12/14/2014    Sig: One by mouth every 36 hours as needed for intercourse    E-Prescribing Status: Receipt confirmed by pharmacy (10/13/2013 9:35 AM EDT)    Patient established care on 08.12.15 and was to have ROV in 2 wks, No Show on 09.09.15 appointment and has not had any further appts/SLS Please Advise on refills.

## 2014-07-19 ENCOUNTER — Encounter: Payer: Self-pay | Admitting: *Deleted

## 2014-07-19 ENCOUNTER — Telehealth: Payer: Self-pay | Admitting: *Deleted

## 2014-07-19 NOTE — Telephone Encounter (Signed)
Pre-Visit Call completed with patient and chart updated.   Pre-Visit Info documented in Specialty Comments under [employer].    

## 2014-07-20 ENCOUNTER — Ambulatory Visit (INDEPENDENT_AMBULATORY_CARE_PROVIDER_SITE_OTHER): Payer: 59 | Admitting: Physician Assistant

## 2014-07-20 ENCOUNTER — Encounter: Payer: Self-pay | Admitting: *Deleted

## 2014-07-20 ENCOUNTER — Encounter: Payer: Self-pay | Admitting: Physician Assistant

## 2014-07-20 VITALS — BP 126/90 | HR 81 | Temp 98.3°F | Ht 69.0 in | Wt 222.6 lb

## 2014-07-20 DIAGNOSIS — E785 Hyperlipidemia, unspecified: Secondary | ICD-10-CM | POA: Diagnosis not present

## 2014-07-20 DIAGNOSIS — J019 Acute sinusitis, unspecified: Secondary | ICD-10-CM

## 2014-07-20 DIAGNOSIS — I10 Essential (primary) hypertension: Secondary | ICD-10-CM | POA: Insufficient documentation

## 2014-07-20 DIAGNOSIS — B9689 Other specified bacterial agents as the cause of diseases classified elsewhere: Secondary | ICD-10-CM

## 2014-07-20 LAB — LIPID PANEL
CHOLESTEROL: 165 mg/dL (ref 0–200)
HDL: 48.3 mg/dL (ref >39.00)
LDL CALC: 103 mg/dL — AB (ref 0–99)
NonHDL: 116.7
TRIGLYCERIDES: 68 mg/dL (ref 0.0–149.0)
Total CHOL/HDL Ratio: 3
VLDL: 13.6 mg/dL (ref 0.0–40.0)

## 2014-07-20 LAB — BASIC METABOLIC PANEL
BUN: 15 mg/dL (ref 6–23)
CHLORIDE: 107 meq/L (ref 96–112)
CO2: 25 meq/L (ref 19–32)
Calcium: 9.3 mg/dL (ref 8.4–10.5)
Creatinine, Ser: 1.26 mg/dL (ref 0.40–1.50)
GFR: 80.02 mL/min (ref >60.00)
Glucose, Bld: 102 mg/dL — ABNORMAL HIGH (ref 70–99)
Potassium: 3.7 mEq/L (ref 3.5–5.1)
SODIUM: 138 meq/L (ref 135–145)

## 2014-07-20 MED ORDER — ROSUVASTATIN CALCIUM 20 MG PO TABS: 20.0000 mg | ORAL_TABLET | Freq: Every day | ORAL | Status: DC

## 2014-07-20 MED ORDER — LISINOPRIL 10 MG PO TABS: 10.0000 mg | ORAL_TABLET | Freq: Every day | ORAL | Status: DC

## 2014-07-20 MED ORDER — AMLODIPINE BESYLATE 5 MG PO TABS: 5.0000 mg | ORAL_TABLET | Freq: Every day | ORAL | Status: DC

## 2014-07-20 MED ORDER — AMOXICILLIN-POT CLAVULANATE 875-125 MG PO TABS: 1.0000 | ORAL_TABLET | Freq: Two times a day (BID) | ORAL | Status: DC

## 2014-07-20 MED ORDER — TADALAFIL 20 MG PO TABS: ORAL_TABLET | ORAL | Status: DC

## 2014-07-20 NOTE — Assessment & Plan Note (Signed)
Rx Augmentin.  Increase fluids.  Rest.  Saline nasal spray.  Probiotic.  Mucinex as directed.  Humidifier in bedroom. Daily Zyrtec.  Call or return to clinic if symptoms are not improving.

## 2014-07-20 NOTE — Progress Notes (Signed)
Patient presents to clinic today for 41-month follow-up.  Hypertension -- Endorses taking amlodipine and lisinopril daily. Patient denies chest pain, palpitations, lightheadedness, dizziness, vision changes or frequent headaches.  Hyperlipidemia -- Is taking Crestor daily without myalgias.  Is due for repeat lipid panel.  Patient is fasting.  Acute Concerns: Patient c/o 2.5 weeks of sinus pressure, sinus pain, ear pressure/pain and fatigue.  Also endorses rare dry cough.  Denies chest congestion, chest pain or SOB.  Denies recent travel or sick contact.  Has + history of allergies but is not currently taking anything for it.  Past Medical History  Diagnosis Date  . Hyperlipidemia   . Hypertension   . Cardiomyopathy   . Gout   . Hyperglycemia   . Toxic effect of venom of bees   . Hypogonadism in male   . Chicken pox     Current Outpatient Prescriptions on File Prior to Visit  Medication Sig Dispense Refill  . EPINEPHrine (EPIPEN) 0.3 mg/0.3 mL DEVI Inject 0.3 mLs (0.3 mg total) into the muscle once. 1 Device 6   No current facility-administered medications on file prior to visit.    Allergies  Allergen Reactions  . Bee Venom Swelling    Family History  Problem Relation Age of Onset  . Coronary artery disease Mother     Living  . Diabetes Mother   . Hyperlipidemia Mother   . Hypertension Mother   . Hypertension Father     Living  . Heart disease Father   . Diabetes Maternal Grandmother   . Kidney failure Maternal Grandmother   . Healthy Brother     x1  . Healthy Sister     x2  . Healthy Son     x2    History   Social History  . Marital Status: Married    Spouse Name: N/A  . Number of Children: N/A  . Years of Education: N/A   Social History Main Topics  . Smoking status: Never Smoker   . Smokeless tobacco: Never Used  . Alcohol Use: Yes     Comment: socail   . Drug Use: No  . Sexual Activity: Not on file   Other Topics Concern  . None   Social  History Narrative     Married - 3 children      Never Smoked      Alcohol use-yes  (social)      Occupation:  works for [employer] [place] - See HPI.  All other ROS are negative.  BP 126/90 mmHg  Pulse 81  Temp(Src) 98.3 F (36.8 C) (Oral)  Ht 5\' 9"  (1.753 m)  Wt 222 lb 9.6 oz (100.971 kg)  BMI 32.86 kg/m2  SpO2 98%  Physical Exam  Constitutional: He is oriented to person, place, and time and well-developed, well-nourished, and in no distress.  HENT:  Head: Normocephalic and atraumatic.  Right Ear: External ear normal.  Left Ear: External ear normal.  Nose: Right sinus exhibits maxillary sinus tenderness and frontal sinus tenderness.  Mouth/Throat: Uvula is midline and oropharynx is clear and moist. No oropharyngeal exudate.  Eyes: Conjunctivae are normal. Pupils are equal, round, and reactive to light.  Neck: Neck supple.  Cardiovascular: Normal rate, regular rhythm, normal heart sounds and intact distal pulses.   Pulmonary/Chest: Effort normal and breath sounds normal. No respiratory distress. He has no wheezes. He has no rales. He exhibits  no tenderness.  Lymphadenopathy:    He has no cervical adenopathy.  Neurological: He is alert and oriented to person, place, and time.  Skin: Skin is warm and dry. No rash noted.  Psychiatric: Affect normal.  Vitals reviewed.  Assessment/Plan: Hyperlipidemia Tolerating statin without side effect.  Will obtain repeat lipid panel today.  Will alter dose based on result.   Essential hypertension, benign Stable.  Continue current regimen.  DASH diet reiterated.  Will check BMP to assess potassium level.   Acute bacterial sinusitis Rx Augmentin.  Increase fluids.  Rest.  Saline nasal spray.  Probiotic.  Mucinex as directed.  Humidifier in bedroom. Daily Zyrtec.  Call or return to clinic if symptoms are not improving.

## 2014-07-20 NOTE — Assessment & Plan Note (Signed)
Stable.  Continue current regimen.  DASH diet reiterated.  Will check BMP to assess potassium level.

## 2014-07-20 NOTE — Patient Instructions (Signed)
Please go to the lab for repeat blood work.  Continue current medication regimen.  We will alter medications if indicated by results.  Please take antibiotic as directed.  Increase fluid intake.  Use Saline nasal spray.  Take a daily multivitamin. Take a daily Claritin as directed.  Place a humidifier in the bedroom.  Please call or return clinic if symptoms are not improving.  Sinusitis Sinusitis is redness, soreness, and swelling (inflammation) of the paranasal sinuses. Paranasal sinuses are air pockets within the bones of your face (beneath the eyes, the middle of the forehead, or above the eyes). In healthy paranasal sinuses, mucus is able to drain out, and air is able to circulate through them by way of your nose. However, when your paranasal sinuses are inflamed, mucus and air can become trapped. This can allow bacteria and other germs to grow and cause infection. Sinusitis can develop quickly and last only a short time (acute) or continue over a long period (chronic). Sinusitis that lasts for more than 12 weeks is considered chronic.  CAUSES  Causes of sinusitis include:  Allergies.  Structural abnormalities, such as displacement of the cartilage that separates your nostrils (deviated septum), which can decrease the air flow through your nose and sinuses and affect sinus drainage.  Functional abnormalities, such as when the small hairs (cilia) that line your sinuses and help remove mucus do not work properly or are not present. SYMPTOMS  Symptoms of acute and chronic sinusitis are the same. The primary symptoms are pain and pressure around the affected sinuses. Other symptoms include:  Upper toothache.  Earache.  Headache.  Bad breath.  Decreased sense of smell and taste.  A cough, which worsens when you are lying flat.  Fatigue.  Fever.  Thick drainage from your nose, which often is green and may contain pus (purulent).  Swelling and warmth over the affected  sinuses. DIAGNOSIS  Your caregiver will perform a physical exam. During the exam, your caregiver may:  Look in your nose for signs of abnormal growths in your nostrils (nasal polyps).  Tap over the affected sinus to check for signs of infection.  View the inside of your sinuses (endoscopy) with a special imaging device with a light attached (endoscope), which is inserted into your sinuses. If your caregiver suspects that you have chronic sinusitis, one or more of the following tests may be recommended:  Allergy tests.  Nasal culture A sample of mucus is taken from your nose and sent to a lab and screened for bacteria.  Nasal cytology A sample of mucus is taken from your nose and examined by your caregiver to determine if your sinusitis is related to an allergy. TREATMENT  Most cases of acute sinusitis are related to a viral infection and will resolve on their own within 10 days. Sometimes medicines are prescribed to help relieve symptoms (pain medicine, decongestants, nasal steroid sprays, or saline sprays).  However, for sinusitis related to a bacterial infection, your caregiver will prescribe antibiotic medicines. These are medicines that will help kill the bacteria causing the infection.  Rarely, sinusitis is caused by a fungal infection. In theses cases, your caregiver will prescribe antifungal medicine. For some cases of chronic sinusitis, surgery is needed. Generally, these are cases in which sinusitis recurs more than 3 times per year, despite other treatments. HOME CARE INSTRUCTIONS   Drink plenty of water. Water helps thin the mucus so your sinuses can drain more easily.  Use a humidifier.  Inhale steam 3  to 4 times a day (for example, sit in the bathroom with the shower running).  Apply a warm, moist washcloth to your face 3 to 4 times a day, or as directed by your caregiver.  Use saline nasal sprays to help moisten and clean your sinuses.  Take over-the-counter or  prescription medicines for pain, discomfort, or fever only as directed by your caregiver. SEEK IMMEDIATE MEDICAL CARE IF:  You have increasing pain or severe headaches.  You have nausea, vomiting, or drowsiness.  You have swelling around your face.  You have vision problems.  You have a stiff neck.  You have difficulty breathing. MAKE SURE YOU:   Understand these instructions.  Will watch your condition.  Will get help right away if you are not doing well or get worse. Document Released: 02/18/2005 Document Revised: 05/13/2011 Document Reviewed: 03/05/2011 [facility] Patient Information 2014 [place], [place].

## 2014-07-20 NOTE — Assessment & Plan Note (Signed)
Tolerating statin without side effect.  Will obtain repeat lipid panel today.  Will alter dose based on result.

## 2014-07-20 NOTE — Progress Notes (Signed)
Pre visit review using our clinic review tool, if applicable. No additional management support is needed unless otherwise documented below in the visit note. 

## 2014-12-14 ENCOUNTER — Encounter: Payer: Self-pay | Admitting: Physician Assistant

## 2014-12-14 ENCOUNTER — Ambulatory Visit: Payer: Self-pay | Admitting: Physician Assistant

## 2014-12-14 ENCOUNTER — Ambulatory Visit (INDEPENDENT_AMBULATORY_CARE_PROVIDER_SITE_OTHER): Payer: Commercial Managed Care - HMO | Admitting: Physician Assistant

## 2014-12-14 VITALS — BP 124/80 | HR 86 | Temp 98.7°F | Ht 68.5 in | Wt 219.2 lb

## 2014-12-14 DIAGNOSIS — J019 Acute sinusitis, unspecified: Secondary | ICD-10-CM | POA: Diagnosis not present

## 2014-12-14 DIAGNOSIS — B9689 Other specified bacterial agents as the cause of diseases classified elsewhere: Secondary | ICD-10-CM

## 2014-12-14 MED ORDER — AMOXICILLIN-POT CLAVULANATE 875-125 MG PO TABS: 1.0000 | ORAL_TABLET | Freq: Two times a day (BID) | ORAL | Status: DC

## 2014-12-14 MED ORDER — FLUTICASONE PROPIONATE 50 MCG/ACT NA SUSP: 2.0000 | Freq: Every day | NASAL | Status: DC

## 2014-12-14 NOTE — Assessment & Plan Note (Signed)
Rx Augmentin.  Increase fluids.  Rest.  Saline nasal spray.  Probiotic.  Mucinex as directed.  Humidifier in bedroom. Flonase and Claritin daily.  Call or return to clinic if symptoms are not improving.

## 2014-12-14 NOTE — Progress Notes (Signed)
Patient presents to clinic today c/o fatigue, body aches, sinus pressure, ear pain and sinus pain x 9 days. Denies fever but notes some mild chills and anorexia. Denies recent travel or sick contact. Has taken ibuprofen for sinus headache with some relief if symptoms.  Past Medical History  Diagnosis Date  . Hyperlipidemia   . Hypertension   . Cardiomyopathy   . Gout   . Hyperglycemia   . Toxic effect of venom of bees   . Hypogonadism in male   . Chicken pox     Current Outpatient Prescriptions on File Prior to Visit  Medication Sig Dispense Refill  . amLODipine (NORVASC) 5 MG tablet Take 1 tablet (5 mg total) by mouth daily. 90 tablet 1  . lisinopril (PRINIVIL,ZESTRIL) 10 MG tablet Take 1 tablet (10 mg total) by mouth daily. 90 tablet 3  . rosuvastatin (CRESTOR) 20 MG tablet Take 1 tablet (20 mg total) by mouth daily. 90 tablet 3  . tadalafil (CIALIS) 20 MG tablet TAKE ONE TABLET BY MOUTH EVERY 36 HOURS AS NEEDED FOR INTERCOURSE 18 tablet 0  . EPINEPHrine (EPIPEN) 0.3 mg/0.3 mL DEVI Inject 0.3 mLs (0.3 mg total) into the muscle once. (Patient not taking: Reported on 12/14/2014) 1 Device 6   No current facility-administered medications on file prior to visit.    Allergies  Allergen Reactions  . Bee Venom Swelling    Family History  Problem Relation Age of Onset  . Coronary artery disease Mother     Living  . Diabetes Mother   . Hyperlipidemia Mother   . Hypertension Mother   . Hypertension Father     Living  . Heart disease Father   . Diabetes Maternal Grandmother   . Kidney failure Maternal Grandmother   . Healthy Brother     x1  . Healthy Sister     x2  . Healthy Son     x2    Social History   Social History  . Marital Status: Married    Spouse Name: N/A  . Number of Children: N/A  . Years of Education: N/A   Social History Main Topics  . Smoking status: Never Smoker   . Smokeless tobacco: Never Used  . Alcohol Use: Yes     Comment: socail   .  Drug Use: No  . Sexual Activity: Not Asked   Other Topics Concern  . None   Social History Narrative     Married - 3 children      Never Smoked      Alcohol use-yes  (social)      Occupation:  works for [employer] [place] - See HPI.  All other ROS are negative.  BP 124/80 mmHg  Pulse 86  Temp(Src) 98.7 F (37.1 C) (Oral)  Ht 5' 8.5" (1.74 m)  Wt 219 lb 4 oz (99.451 kg)  BMI 32.85 kg/m2  SpO2 96%  Physical Exam  Constitutional: He is oriented to person, place, and time and well-developed, well-nourished, and in no distress.  HENT:  Head: Normocephalic and atraumatic.  Right Ear: External ear normal.  Left Ear: External ear normal.  Mouth/Throat: Oropharynx is clear and moist.  + nasal congestion. + TTP of right frontal and maxillary sinuses.  Eyes: Conjunctivae are normal.  Neck: Neck supple.  Cardiovascular: Normal rate, regular rhythm, normal heart sounds and intact distal pulses.   Pulmonary/Chest: Effort normal and  breath sounds normal. No respiratory distress. He has no wheezes. He has no rales. He exhibits no tenderness.  Neurological: He is alert and oriented to person, place, and time.  Skin: Skin is warm and dry. No rash noted.  Psychiatric: Affect normal.  Vitals reviewed.   No results found for this or any previous visit (from the past 2160 hour(s)).  Assessment/Plan: Acute bacterial sinusitis Rx Augmentin.  Increase fluids.  Rest.  Saline nasal spray.  Probiotic.  Mucinex as directed.  Humidifier in bedroom. Flonase and Claritin daily.  Call or return to clinic if symptoms are not improving.

## 2014-12-14 NOTE — Progress Notes (Signed)
Pre visit review using our clinic review tool, if applicable. No additional management support is needed unless otherwise documented below in the visit note. 

## 2014-12-14 NOTE — Patient Instructions (Signed)
Please take antibiotic as directed.  Increase fluid intake.  Use Saline nasal spray.  Take a daily multivitamin. Use Flonase as directed and take a daily Claritin.  Place a humidifier in the bedroom.  Please call or return clinic if symptoms are not improving.  Sinusitis Sinusitis is redness, soreness, and swelling (inflammation) of the paranasal sinuses. Paranasal sinuses are air pockets within the bones of your face (beneath the eyes, the middle of the forehead, or above the eyes). In healthy paranasal sinuses, mucus is able to drain out, and air is able to circulate through them by way of your nose. However, when your paranasal sinuses are inflamed, mucus and air can become trapped. This can allow bacteria and other germs to grow and cause infection. Sinusitis can develop quickly and last only a short time (acute) or continue over a long period (chronic). Sinusitis that lasts for more than 12 weeks is considered chronic.  CAUSES  Causes of sinusitis include:  Allergies.  Structural abnormalities, such as displacement of the cartilage that separates your nostrils (deviated septum), which can decrease the air flow through your nose and sinuses and affect sinus drainage.  Functional abnormalities, such as when the small hairs (cilia) that line your sinuses and help remove mucus do not work properly or are not present. SYMPTOMS  Symptoms of acute and chronic sinusitis are the same. The primary symptoms are pain and pressure around the affected sinuses. Other symptoms include:  Upper toothache.  Earache.  Headache.  Bad breath.  Decreased sense of smell and taste.  A cough, which worsens when you are lying flat.  Fatigue.  Fever.  Thick drainage from your nose, which often is green and may contain pus (purulent).  Swelling and warmth over the affected sinuses. DIAGNOSIS  Your caregiver will perform a physical exam. During the exam, your caregiver may:  Look in your nose for signs  of abnormal growths in your nostrils (nasal polyps).  Tap over the affected sinus to check for signs of infection.  View the inside of your sinuses (endoscopy) with a special imaging device with a light attached (endoscope), which is inserted into your sinuses. If your caregiver suspects that you have chronic sinusitis, one or more of the following tests may be recommended:  Allergy tests.  Nasal culture A sample of mucus is taken from your nose and sent to a lab and screened for bacteria.  Nasal cytology A sample of mucus is taken from your nose and examined by your caregiver to determine if your sinusitis is related to an allergy. TREATMENT  Most cases of acute sinusitis are related to a viral infection and will resolve on their own within 10 days. Sometimes medicines are prescribed to help relieve symptoms (pain medicine, decongestants, nasal steroid sprays, or saline sprays).  However, for sinusitis related to a bacterial infection, your caregiver will prescribe antibiotic medicines. These are medicines that will help kill the bacteria causing the infection.  Rarely, sinusitis is caused by a fungal infection. In theses cases, your caregiver will prescribe antifungal medicine. For some cases of chronic sinusitis, surgery is needed. Generally, these are cases in which sinusitis recurs more than 3 times per year, despite other treatments. HOME CARE INSTRUCTIONS   Drink plenty of water. Water helps thin the mucus so your sinuses can drain more easily.  Use a humidifier.  Inhale steam 3 to 4 times a day (for example, sit in the bathroom with the shower running).  Apply a warm, moist washcloth  to your face 3 to 4 times a day, or as directed by your caregiver.  Use saline nasal sprays to help moisten and clean your sinuses.  Take over-the-counter or prescription medicines for pain, discomfort, or fever only as directed by your caregiver. SEEK IMMEDIATE MEDICAL CARE IF:  You have  increasing pain or severe headaches.  You have nausea, vomiting, or drowsiness.  You have swelling around your face.  You have vision problems.  You have a stiff neck.  You have difficulty breathing. MAKE SURE YOU:   Understand these instructions.  Will watch your condition.  Will get help right away if you are not doing well or get worse. Document Released: 02/18/2005 Document Revised: 05/13/2011 Document Reviewed: 03/05/2011 [facility] Patient Information 2014 [place], [place].

## 2014-12-28 ENCOUNTER — Encounter: Payer: Self-pay | Admitting: Physician Assistant

## 2015-01-03 ENCOUNTER — Encounter: Payer: Self-pay | Admitting: Behavioral Health

## 2015-01-03 ENCOUNTER — Telehealth: Payer: Self-pay | Admitting: Behavioral Health

## 2015-01-03 NOTE — Telephone Encounter (Signed)
Pre-Visit Call completed with patient and chart updated.   Pre-Visit Info documented in Specialty Comments under [employer].    

## 2015-01-04 ENCOUNTER — Encounter: Payer: Commercial Managed Care - HMO | Admitting: Physician Assistant

## 2015-01-04 ENCOUNTER — Telehealth: Payer: Self-pay | Admitting: Physician Assistant

## 2015-01-04 NOTE — Telephone Encounter (Signed)
Patient called and [facility] physical appointment due to car issues, charge or no charge

## 2015-01-04 NOTE — Telephone Encounter (Signed)
No charge. 

## 2015-01-10 ENCOUNTER — Telehealth: Payer: Self-pay | Admitting: Behavioral Health

## 2015-01-10 NOTE — Telephone Encounter (Signed)
Unable to reach patient at time of Pre-Visit Call.  Left message for patient to return call when available.    

## 2015-01-11 ENCOUNTER — Telehealth: Payer: Self-pay | Admitting: Physician Assistant

## 2015-01-11 ENCOUNTER — Encounter: Payer: Commercial Managed Care - HMO | Admitting: Physician Assistant

## 2015-01-11 DIAGNOSIS — Z0289 Encounter for other administrative examinations: Secondary | ICD-10-CM

## 2015-01-11 NOTE — Telephone Encounter (Signed)
Charge. Has prior no-shows.

## 2015-01-11 NOTE — Telephone Encounter (Signed)
Pt left VM 01/11/15 7:43am that he will not be here for CPE appt today at 8:30am. He said he has to work. I called and left msg for him to call back and reschedule. Charge or no charge?

## 2015-03-28 ENCOUNTER — Telehealth: Payer: Self-pay | Admitting: Physician Assistant

## 2015-03-28 ENCOUNTER — Ambulatory Visit: Payer: Commercial Managed Care - HMO | Admitting: Physician Assistant

## 2015-03-28 NOTE — Telephone Encounter (Signed)
No charge but remind patient of 24 hours cancellation policy.

## 2015-03-28 NOTE — Telephone Encounter (Signed)
Patient called and left message on VM @ 0950 this morning stating that he needed to cancel his appt. States he went to the ER yesterday and no longer needed this appt. Request No Show fee be waived.

## 2015-05-24 ENCOUNTER — Ambulatory Visit (INDEPENDENT_AMBULATORY_CARE_PROVIDER_SITE_OTHER): Payer: Managed Care, Other (non HMO) | Admitting: Physician Assistant

## 2015-05-24 ENCOUNTER — Encounter: Payer: Self-pay | Admitting: Physician Assistant

## 2015-05-24 VITALS — BP 150/90 | HR 100 | Temp 98.3°F | Ht 68.5 in | Wt 234.4 lb

## 2015-05-24 DIAGNOSIS — B349 Viral infection, unspecified: Secondary | ICD-10-CM | POA: Diagnosis not present

## 2015-05-24 MED ORDER — FLUTICASONE PROPIONATE 50 MCG/ACT NA SUSP: 2.0000 | Freq: Every day | NASAL | Status: DC

## 2015-05-24 MED FILL — FLUTICASONE PROP 50 MCG SPR: 50 | 30 days supply | Qty: 16 | Fill #0

## 2015-05-24 NOTE — Patient Instructions (Signed)
Please stay well hydrated and get plenty of rest. Eat a bland diet (see below). Tylenol for aches and chills. Delsym for cough (over the counter) I have sent in a prescription for Flonase for you to use as directed.  Follow-up if symptoms are not improving.  Food Choices to Help Relieve Diarrhea, Adult When you have diarrhea, the foods you eat and your eating habits are very important. Choosing the right foods and drinks can help relieve diarrhea. Also, because diarrhea can last up to 7 days, you need to replace lost fluids and electrolytes (such as sodium, potassium, and chloride) in order to help prevent dehydration.  WHAT GENERAL GUIDELINES DO I NEED TO FOLLOW?  Slowly drink 1 cup (8 oz) of fluid for each episode of diarrhea. If you are getting enough fluid, your urine will be clear or pale yellow.  Eat starchy foods. Some good choices include white rice, white toast, pasta, low-fiber cereal, baked potatoes (without the skin), saltine crackers, and bagels.  Avoid large servings of any cooked vegetables.  Limit fruit to two servings per day. A serving is  cup or 1 small piece.  Choose foods with less than 2 g of fiber per serving.  Limit fats to less than 8 tsp (38 g) per day.  Avoid fried foods.  Eat foods that have probiotics in them. Probiotics can be found in certain dairy products.  Avoid foods and beverages that may increase the speed at which food moves through the stomach and intestines (gastrointestinal tract). Things to avoid include:  High-fiber foods, such as dried fruit, raw fruits and vegetables, nuts, seeds, and whole grain foods.  Spicy foods and high-fat foods.  Foods and beverages sweetened with high-fructose corn syrup, honey, or sugar alcohols such as xylitol, sorbitol, and mannitol. WHAT FOODS ARE RECOMMENDED? Grains White rice. White, [place], or pita breads (fresh or toasted), including plain rolls, buns, or bagels. White pasta. Saltine, soda, or graham  crackers. Pretzels. Low-fiber cereal. Cooked cereals made with water (such as cornmeal, farina, or cream cereals). Plain muffins. Matzo. Melba toast. Zwieback.  Vegetables Potatoes (without the skin). Strained tomato and vegetable juices. Most well-cooked and canned vegetables without seeds. Tender lettuce. Fruits Cooked or canned applesauce, apricots, cherries, fruit cocktail, grapefruit, peaches, pears, or plums. Fresh bananas, apples without skin, cherries, grapes, cantaloupe, grapefruit, peaches, oranges, or plums.  Meat and Other Protein Products Baked or boiled chicken. Eggs. Tofu. Fish. Seafood. Smooth peanut butter. Ground or well-cooked tender beef, ham, veal, lamb, pork, or poultry.  Dairy Plain yogurt, kefir, and unsweetened liquid yogurt. Lactose-free milk, buttermilk, or soy milk. Plain hard cheese. Beverages Sport drinks. Clear broths. Diluted fruit juices (except prune). Regular, caffeine-free sodas such as ginger ale. Water. Decaffeinated teas. Oral rehydration solutions. Sugar-free beverages not sweetened with sugar alcohols. Other Bouillon, broth, or soups made from recommended foods.  The items listed above may not be a complete list of recommended foods or beverages. Contact your dietitian for more options. WHAT FOODS ARE NOT RECOMMENDED? Grains Whole grain, whole wheat, bran, or rye breads, rolls, pastas, crackers, and cereals. Wild or brown rice. Cereals that contain more than 2 g of fiber per serving. Corn tortillas or taco shells. Cooked or dry oatmeal. Granola. Popcorn. Vegetables Raw vegetables. Cabbage, broccoli, Brussels sprouts, artichokes, baked beans, beet greens, corn, kale, legumes, peas, sweet potatoes, and yams. Potato skins. Cooked spinach and cabbage. Fruits Dried fruit, including raisins and dates. Raw fruits. Stewed or dried prunes. Fresh apples with skin, apricots, mangoes,  pears, raspberries, and strawberries.  Meat and Other Protein Products Chunky  peanut butter. Nuts and seeds. Beans and lentils. Berniece Salines.  Dairy High-fat cheeses. Milk, chocolate milk, and beverages made with milk, such as milk shakes. Cream. Ice cream. Sweets and Desserts Sweet rolls, doughnuts, and sweet breads. Pancakes and waffles. Fats and Oils Butter. Cream sauces. Margarine. Salad oils. Plain salad dressings. Olives. Avocados.  Beverages Caffeinated beverages (such as coffee, tea, soda, or energy drinks). Alcoholic beverages. Fruit juices with pulp. Prune juice. Soft drinks sweetened with high-fructose corn syrup or sugar alcohols. Other Coconut. Hot sauce. [place] powder. Mayonnaise. Gravy. Cream-based or milk-based soups.  The items listed above may not be a complete list of foods and beverages to avoid. Contact your dietitian for more information. WHAT SHOULD I DO IF I BECOME DEHYDRATED? Diarrhea can sometimes lead to dehydration. Signs of dehydration include dark urine and dry mouth and skin. If you think you are dehydrated, you should rehydrate with an oral rehydration solution. These solutions can be purchased at pharmacies, retail stores, or online.  Drink -1 cup (120-240 mL) of oral rehydration solution each time you have an episode of diarrhea. If drinking this amount makes your diarrhea worse, try drinking smaller amounts more often. For example, drink 1-3 tsp (5-15 mL) every 5-10 minutes.  A general rule for staying hydrated is to drink 1-2 L of fluid per day. Talk to your health care provider about the specific amount you should be drinking each day. Drink enough fluids to keep your urine clear or pale yellow.   This information is not intended to replace advice given to you by your health care provider. Make sure you discuss any questions you have with your health care provider.   Document Released: 05/11/2003 Document Revised: 03/11/2014 Document Reviewed: 01/11/2013 Elsevier Interactive Patient Education [employer].

## 2015-05-24 NOTE — Progress Notes (Signed)
Pre visit review using our clinic review tool, if applicable. No additional management support is needed unless otherwise documented below in the visit note. 

## 2015-05-24 NOTE — Progress Notes (Signed)
Patient presents to clinic today c/o 4 days of dry cough, nasal congestion, aches, chills and nausea. Endorses T 101 on Monday. Denies fever today. Denies chest pain or SOB. Endorses some loose stools (improving).  Endorses multiple sick contacts at work. Has not had flu shot.  Past Medical History  Diagnosis Date  . Hyperlipidemia   . Hypertension   . Cardiomyopathy   . Gout   . Hyperglycemia   . Toxic effect of venom of bees   . Hypogonadism in male   . Chicken pox     Current Outpatient Prescriptions on File Prior to Visit  Medication Sig Dispense Refill  . amLODipine (NORVASC) 5 MG tablet Take 1 tablet (5 mg total) by mouth daily. 90 tablet 1  . EPINEPHrine (EPIPEN) 0.3 mg/0.3 mL DEVI Inject 0.3 mLs (0.3 mg total) into the muscle once. (Patient not taking: Reported on 12/14/2014) 1 Device 6  . lisinopril (PRINIVIL,ZESTRIL) 10 MG tablet Take 1 tablet (10 mg total) by mouth daily. 90 tablet 3  . rosuvastatin (CRESTOR) 20 MG tablet Take 1 tablet (20 mg total) by mouth daily. 90 tablet 3  . tadalafil (CIALIS) 20 MG tablet TAKE ONE TABLET BY MOUTH EVERY 36 HOURS AS NEEDED FOR INTERCOURSE 18 tablet 0   No current facility-administered medications on file prior to visit.    Allergies  Allergen Reactions  . Bee Venom Swelling    Family History  Problem Relation Age of Onset  . Coronary artery disease Mother     Living  . Diabetes Mother   . Hyperlipidemia Mother   . Hypertension Mother   . Hypertension Father     Living  . Heart disease Father   . Diabetes Maternal Grandmother   . Kidney failure Maternal Grandmother   . Healthy Brother     x1  . Healthy Sister     x2  . Healthy Son     x2    Social History   Social History  . Marital Status: Married    Spouse Name: N/A  . Number of Children: N/A  . Years of Education: N/A   Social History Main Topics  . Smoking status: Never Smoker   . Smokeless tobacco: Never Used  . Alcohol Use: Yes     Comment: socail    . Drug Use: No  . Sexual Activity: Not Asked   Other Topics Concern  . None   Social History Narrative     Married - 3 children      Never Smoked      Alcohol use-yes  (social)      Occupation:  works for [employer] [place] - See HPI.  All other ROS are negative.  BP 150/90 mmHg  Pulse 100  Temp(Src) 98.3 F (36.8 C) (Oral)  Ht 5' 8.5" (1.74 m)  Wt 234 lb 6.4 oz (106.323 kg)  BMI 35.12 kg/m2  SpO2 97%  Physical Exam  Constitutional: He is oriented to person, place, and time and well-developed, well-nourished, and in no distress.  HENT:  Head: Normocephalic and atraumatic.  Right Ear: External ear normal.  Left Ear: External ear normal.  Nose: Nose normal.  Mouth/Throat: Oropharynx is clear and moist. No oropharyngeal exudate.  TM within normal limits  Eyes: Conjunctivae are normal. Pupils are equal, round, and reactive to light.  Neck: Neck supple.  Cardiovascular: Normal rate, regular rhythm, normal heart sounds and  intact distal pulses.   Pulmonary/Chest: Effort normal and breath sounds normal. No respiratory distress. He has no wheezes. He has no rales. He exhibits no tenderness.  Abdominal: Soft. Bowel sounds are normal. He exhibits no distension and no mass. There is no tenderness. There is no rebound and no guarding.  Neurological: He is alert and oriented to person, place, and time.  Skin: Skin is warm and dry. No rash noted.  Psychiatric: Affect normal.  Vitals reviewed.   No results found for this or any previous visit (from the past 2160 hour(s)).  Assessment/Plan: Viral illness 4 days of symptoms that are improving. Doubt flu but we are outside the window for treatment so do not feel testing necessary. Supportive measures and diet reviewed with patient. Follow-up if symptoms do not continue resolving.

## 2015-05-25 DIAGNOSIS — B349 Viral infection, unspecified: Secondary | ICD-10-CM | POA: Insufficient documentation

## 2015-05-25 NOTE — Assessment & Plan Note (Signed)
4 days of symptoms that are improving. Doubt flu but we are outside the window for treatment so do not feel testing necessary. Supportive measures and diet reviewed with patient. Follow-up if symptoms do not continue resolving.

## 2015-08-20 ENCOUNTER — Other Ambulatory Visit: Payer: Self-pay | Admitting: Physician Assistant

## 2015-08-20 NOTE — Telephone Encounter (Signed)
Patient needs a CPE or at least BP follow-up scheduled before any further refills will be given. Needs assessment and laboratory studies.

## 2016-01-03 ENCOUNTER — Telehealth: Payer: Self-pay | Admitting: Physician Assistant

## 2016-01-03 NOTE — Telephone Encounter (Signed)
Relation to [id number] Call back [phone] Pharmacy: [place], [place] - [address] [phone] (Fax)     Reason for call:  Patient requesting a refill tadalafil (CIALIS) 20 MG tablet

## 2016-01-04 MED ORDER — TADALAFIL 20 MG PO TABS: ORAL_TABLET | ORAL | 2 refills | Status: DC

## 2016-01-04 NOTE — Telephone Encounter (Signed)
Okay to refill? Last filled 07/2014.

## 2016-01-04 NOTE — Telephone Encounter (Signed)
Ok to refill. With 2 additional refills. Please let patient know. Thank you.

## 2016-01-04 NOTE — Telephone Encounter (Signed)
Rx sent 

## 2016-02-02 ENCOUNTER — Encounter: Payer: Self-pay | Admitting: Physician Assistant

## 2016-02-02 ENCOUNTER — Ambulatory Visit (INDEPENDENT_AMBULATORY_CARE_PROVIDER_SITE_OTHER): Payer: Managed Care, Other (non HMO) | Admitting: Physician Assistant

## 2016-02-02 VITALS — BP 143/84 | HR 96 | Temp 98.1°F | Resp 16 | Ht 68.5 in | Wt 228.0 lb

## 2016-02-02 DIAGNOSIS — E785 Hyperlipidemia, unspecified: Secondary | ICD-10-CM | POA: Diagnosis not present

## 2016-02-02 DIAGNOSIS — E291 Testicular hypofunction: Secondary | ICD-10-CM | POA: Insufficient documentation

## 2016-02-02 DIAGNOSIS — I10 Essential (primary) hypertension: Secondary | ICD-10-CM | POA: Insufficient documentation

## 2016-02-02 LAB — COMPREHENSIVE METABOLIC PANEL
ALBUMIN: 4.3 g/dL (ref 3.5–5.2)
ALK PHOS: 57 U/L (ref 39–117)
ALT: 21 U/L (ref 0–53)
AST: 28 U/L (ref 0–37)
BILIRUBIN TOTAL: 0.7 mg/dL (ref 0.2–1.2)
BUN: 13 mg/dL (ref 6–23)
CO2: 28 mEq/L (ref 19–32)
Calcium: 9.4 mg/dL (ref 8.4–10.5)
Chloride: 107 mEq/L (ref 96–112)
Creatinine, Ser: 1.31 mg/dL (ref 0.40–1.50)
GFR: 75.97 mL/min (ref >60.00)
Glucose, Bld: 83 mg/dL (ref 70–99)
POTASSIUM: 4.6 meq/L (ref 3.5–5.1)
Sodium: 143 mEq/L (ref 135–145)
TOTAL PROTEIN: 7.6 g/dL (ref 6.0–8.3)

## 2016-02-02 LAB — LIPID PANEL
Cholesterol: 253 mg/dL — ABNORMAL HIGH (ref 0–200)
HDL: 51.3 mg/dL (ref >39.00)
LDL CALC: 181 mg/dL — AB (ref 0–99)
NONHDL: 201.8
Total CHOL/HDL Ratio: 5
Triglycerides: 102 mg/dL (ref 0.0–149.0)
VLDL: 20.4 mg/dL (ref 0.0–40.0)

## 2016-02-02 LAB — TESTOSTERONE: TESTOSTERONE: 278.72 ng/dL — AB (ref 300.00–890.00)

## 2016-02-02 MED ORDER — ROSUVASTATIN CALCIUM 20 MG PO TABS: 20.0000 mg | ORAL_TABLET | Freq: Every day | ORAL | 1 refills | Status: DC

## 2016-02-02 MED ORDER — AMLODIPINE BESYLATE 5 MG PO TABS: 5.0000 mg | ORAL_TABLET | Freq: Every day | ORAL | 0 refills | Status: DC

## 2016-02-02 MED ORDER — AMLODIPINE BESYLATE 5 MG PO TABS
5.0000 mg | ORAL_TABLET | Freq: Every day | ORAL | 0 refills | Status: DC
Start: 2016-02-02 — End: 2016-10-18

## 2016-02-02 MED ORDER — LISINOPRIL 10 MG PO TABS: 10.0000 mg | ORAL_TABLET | Freq: Every day | ORAL | 0 refills | Status: DC

## 2016-02-02 MED ORDER — TADALAFIL 20 MG PO TABS: ORAL_TABLET | ORAL | 2 refills | Status: DC

## 2016-02-02 NOTE — Progress Notes (Signed)
Pre visit review using our clinic review tool, if applicable. No additional management support is needed unless otherwise documented below in the visit note. 

## 2016-02-02 NOTE — Assessment & Plan Note (Signed)
Repeat testosterone level today.

## 2016-02-02 NOTE — Progress Notes (Signed)
Patient presents to clinic today for follow-up of hypertension and hyperlipidemia. Patient is currently on a regimen of lisinopril 10 mg, Amlodipine 5 mg, and Crestor 20 mg daily. Was taking medications as directed until running out a couple of months ago. Patient was overdue for follow-up so medications were not refilled until he scheduled follow-up. BP previously controlled with this regimen. Patient denies chest pain, palpitations, lightheadedness, dizziness, vision changes or frequent headaches.  BP Readings from Last 3 Encounters:  02/02/16 (!) 143/84  05/24/15 (!) 150/90  12/14/14 124/80   Patient with history of hypogonadism. No current treatment. Is requesting repeat labs today.   Past Medical History:  Diagnosis Date  . Cardiomyopathy   . Chicken pox   . Gout   . Hyperglycemia   . Hyperlipidemia   . Hypertension   . Hypogonadism in male   . Toxic effect of venom of bees     Current Outpatient Prescriptions on File Prior to Visit  Medication Sig Dispense Refill  . EPINEPHrine (EPIPEN) 0.3 mg/0.3 mL DEVI Inject 0.3 mLs (0.3 mg total) into the muscle once. (Patient not taking: Reported on 02/02/2016) 1 Device 6   No current facility-administered medications on file prior to visit.     Allergies  Allergen Reactions  . Bee Venom Swelling    Family History  Problem Relation Age of Onset  . Coronary artery disease Mother     Living  . Diabetes Mother   . Hyperlipidemia Mother   . Hypertension Mother   . Hypertension Father     Living  . Heart disease Father   . Diabetes Maternal Grandmother   . Kidney failure Maternal Grandmother   . Healthy Brother     x1  . Healthy Sister     x2  . Healthy Son     x2    Social History   Social History  . Marital status: Married    Spouse name: N/A  . Number of children: N/A  . Years of education: N/A   Social History Main Topics  . Smoking status: Never Smoker  . Smokeless tobacco: Never Used  . Alcohol use Yes       Comment: socail   . Drug use: No  . Sexual activity: Not Asked   Other Topics Concern  . None   Social History Narrative     Married - 3 children      Never Smoked      Alcohol use-yes  (social)      Occupation:  works for [employer] [place] - See HPI.  All other ROS are negative.  BP (!) 143/84   Pulse 96   Temp 98.1 F (36.7 C) (Oral)   Resp 16   Ht 5' 8.5" (1.74 m)   Wt 228 lb (103.4 kg)   SpO2 97%   BMI 34.16 kg/m   Physical Exam  Constitutional: He is oriented to person, place, and time and well-developed, well-nourished, and in no distress.  HENT:  Head: Normocephalic and atraumatic.  Eyes: Conjunctivae are normal.  Neck: Neck supple. No thyromegaly present.  Cardiovascular: Normal rate, regular rhythm, normal heart sounds and intact distal pulses.   Pulmonary/Chest: Effort normal and breath sounds normal. No respiratory distress. He has no wheezes. He has no rales. He exhibits no tenderness.  Neurological: He is alert and oriented to person, place, and time.  Skin: Skin is warm  and dry. No rash noted.  Psychiatric: Affect normal.  Vitals reviewed.  Assessment/Plan: Hyperlipidemia Tolerating statin well. Diet and exercise reviewed. Will repeat lipids and LFT as overdue.   Essential hypertension BP above goal. Patient out of medication for several weeks. Asymptomatic. Will restart prior regimen. Check BMP today. Will FU 2 weeks for BP check with nurse on medications.  Hypogonadism in male Repeat testosterone level today.    Leeanne Rio, PA-C

## 2016-02-02 NOTE — Addendum Note (Signed)
Addended by: Leonidas Romberg on: 02/02/2016 11:14 AM   Modules accepted: Orders

## 2016-02-02 NOTE — Assessment & Plan Note (Signed)
BP above goal. Patient out of medication for several weeks. Asymptomatic. Will restart prior regimen. Check BMP today. Will FU 2 weeks for BP check with nurse on medications.

## 2016-02-02 NOTE — Patient Instructions (Signed)
Please go to the lab for blood work. I will call you with your results.  Please restart the medications as directed.  Follow-up for a nurse visit in 2 weeks to recheck your BP on the medications. MAKE SURE TO TAKE THEM BEFORE YOUR VISIT!  DASH Eating Plan DASH stands for "Dietary Approaches to Stop Hypertension." The DASH eating plan is a healthy eating plan that has been shown to reduce high blood pressure (hypertension). Additional health benefits may include reducing the risk of type 2 diabetes mellitus, heart disease, and stroke. The DASH eating plan may also help with weight loss. What do I need to know about the DASH eating plan? For the DASH eating plan, you will follow these general guidelines:  Choose foods with less than 150 milligrams of sodium per serving (as listed on the food label).  Use salt-free seasonings or herbs instead of table salt or sea salt.  Check with your health care provider or pharmacist before using salt substitutes.  Eat lower-sodium products. These are often labeled as "low-sodium" or "no salt added."  Eat fresh foods. Avoid eating a lot of canned foods.  Eat more vegetables, fruits, and low-fat dairy products.  Choose whole grains. Look for the word "whole" as the first word in the ingredient list.  Choose fish and skinless chicken or [place] more often than red meat. Limit fish, poultry, and meat to 6 oz (170 g) each day.  Limit sweets, desserts, sugars, and sugary drinks.  Choose heart-healthy fats.  Eat more home-cooked food and less restaurant, buffet, and fast food.  Limit fried foods.  Do not fry foods. Cook foods using methods such as baking, boiling, grilling, and broiling instead.  When eating at a restaurant, ask that your food be prepared with less salt, or no salt if possible. What foods can I eat? Seek help from a dietitian for individual calorie needs. Grains  Whole grain or whole wheat bread. Brown rice. Whole grain or whole  wheat pasta. Quinoa, bulgur, and whole grain cereals. Low-sodium cereals. Corn or whole wheat flour tortillas. Whole grain cornbread. Whole grain crackers. Low-sodium crackers. Vegetables  Fresh or frozen vegetables (raw, steamed, roasted, or grilled). Low-sodium or reduced-sodium tomato and vegetable juices. Low-sodium or reduced-sodium tomato sauce and paste. Low-sodium or reduced-sodium canned vegetables. Fruits  All fresh, canned (in natural juice), or frozen fruits. Meat and Other Protein Products  Ground beef (85% or leaner), grass-fed beef, or beef trimmed of fat. Skinless chicken or [place]. Ground chicken or [place]. Pork trimmed of fat. All fish and seafood. Eggs. Dried beans, peas, or lentils. Unsalted nuts and seeds. Unsalted canned beans. Dairy  Low-fat dairy products, such as skim or 1% milk, 2% or reduced-fat cheeses, low-fat ricotta or cottage cheese, or plain low-fat yogurt. Low-sodium or reduced-sodium cheeses. Fats and Oils  Tub margarines without trans fats. Light or reduced-fat mayonnaise and salad dressings (reduced sodium). Avocado. Safflower, olive, or canola oils. Natural peanut or almond butter. Other  Unsalted popcorn and pretzels. The items listed above may not be a complete list of recommended foods or beverages. Contact your dietitian for more options.  What foods are not recommended? Grains  White bread. White pasta. White rice. Refined cornbread. Bagels and croissants. Crackers that contain trans fat. Vegetables  Creamed or fried vegetables. Vegetables in a cheese sauce. Regular canned vegetables. Regular canned tomato sauce and paste. Regular tomato and vegetable juices. Fruits  Canned fruit in light or heavy syrup. Fruit juice. Meat and Other Protein  Products  Fatty cuts of meat. Ribs, chicken wings, bacon, sausage, bologna, salami, chitterlings, fatback, hot dogs, bratwurst, and packaged luncheon meats. Salted nuts and seeds. Canned beans with salt. Dairy   Whole or 2% milk, cream, half-and-half, and cream cheese. Whole-fat or sweetened yogurt. Full-fat cheeses or blue cheese. Nondairy creamers and whipped toppings. Processed cheese, cheese spreads, or cheese curds. Condiments  Onion and garlic salt, seasoned salt, table salt, and sea salt. Canned and packaged gravies. Worcestershire sauce. Tartar sauce. Barbecue sauce. Teriyaki sauce. Soy sauce, including reduced sodium. Steak sauce. Fish sauce. Oyster sauce. Cocktail sauce. Horseradish. Ketchup and mustard. Meat flavorings and tenderizers. Bouillon cubes. Hot sauce. Tabasco sauce. Marinades. Taco seasonings. Relishes. Fats and Oils  Butter, stick margarine, lard, shortening, ghee, and bacon fat. Coconut, palm kernel, or palm oils. Regular salad dressings. Other  Pickles and olives. Salted popcorn and pretzels. The items listed above may not be a complete list of foods and beverages to avoid. Contact your dietitian for more information.  Where can I find more information? National Heart, Lung, and Blood Institute: travelstabloid.com This information is not intended to replace advice given to you by your health care provider. Make sure you discuss any questions you have with your health care provider. Document Released: 02/07/2011 Document Revised: 07/27/2015 Document Reviewed: 12/23/2012 Elsevier Interactive Patient Education  2017 [employer].

## 2016-02-02 NOTE — Assessment & Plan Note (Signed)
Tolerating statin well. Diet and exercise reviewed. Will repeat lipids and LFT as overdue.

## 2016-02-08 ENCOUNTER — Encounter: Payer: Self-pay | Admitting: Emergency Medicine

## 2016-02-16 ENCOUNTER — Ambulatory Visit: Payer: Self-pay | Admitting: Family

## 2016-02-16 ENCOUNTER — Ambulatory Visit: Payer: Managed Care, Other (non HMO)

## 2016-10-18 ENCOUNTER — Other Ambulatory Visit: Payer: Self-pay | Admitting: Emergency Medicine

## 2016-10-18 ENCOUNTER — Ambulatory Visit (INDEPENDENT_AMBULATORY_CARE_PROVIDER_SITE_OTHER): Payer: Managed Care, Other (non HMO) | Admitting: Physician Assistant

## 2016-10-18 ENCOUNTER — Encounter: Payer: Self-pay | Admitting: Physician Assistant

## 2016-10-18 VITALS — BP 138/98 | HR 78 | Temp 98.5°F | Resp 14 | Ht 69.0 in | Wt 223.0 lb

## 2016-10-18 DIAGNOSIS — I10 Essential (primary) hypertension: Secondary | ICD-10-CM

## 2016-10-18 DIAGNOSIS — E785 Hyperlipidemia, unspecified: Secondary | ICD-10-CM

## 2016-10-18 DIAGNOSIS — Z125 Encounter for screening for malignant neoplasm of prostate: Secondary | ICD-10-CM

## 2016-10-18 DIAGNOSIS — Z0000 Encounter for general adult medical examination without abnormal findings: Secondary | ICD-10-CM | POA: Diagnosis not present

## 2016-10-18 LAB — COMPREHENSIVE METABOLIC PANEL
ALBUMIN: 4.2 g/dL (ref 3.5–5.2)
ALT: 17 U/L (ref 0–53)
AST: 26 U/L (ref 0–37)
Alkaline Phosphatase: 54 U/L (ref 39–117)
BUN: 14 mg/dL (ref 6–23)
CHLORIDE: 106 meq/L (ref 96–112)
CO2: 29 meq/L (ref 19–32)
CREATININE: 1.4 mg/dL (ref 0.40–1.50)
Calcium: 9.5 mg/dL (ref 8.4–10.5)
GFR: 70.14 mL/min (ref >60.00)
Glucose, Bld: 92 mg/dL (ref 70–99)
Potassium: 4.2 mEq/L (ref 3.5–5.1)
Sodium: 141 mEq/L (ref 135–145)
Total Bilirubin: 0.5 mg/dL (ref 0.2–1.2)
Total Protein: 7.3 g/dL (ref 6.0–8.3)

## 2016-10-18 LAB — URINALYSIS, ROUTINE W REFLEX MICROSCOPIC
BILIRUBIN URINE: NEGATIVE
HGB URINE DIPSTICK: NEGATIVE
KETONES UR: NEGATIVE
LEUKOCYTES UA: NEGATIVE
Nitrite: NEGATIVE
PH: 6 (ref 5.0–8.0)
RBC / HPF: NONE SEEN (ref 0–?)
Specific Gravity, Urine: 1.025 (ref 1.000–1.030)
URINE GLUCOSE: NEGATIVE
UROBILINOGEN UA: 0.2 (ref 0.0–1.0)
WBC, UA: NONE SEEN (ref 0–?)

## 2016-10-18 LAB — CBC WITH DIFFERENTIAL/PLATELET
BASOS ABS: 0 10*3/uL (ref 0.0–0.1)
Basophils Relative: 0.6 % (ref 0.0–3.0)
Eosinophils Absolute: 0.2 10*3/uL (ref 0.0–0.7)
Eosinophils Relative: 4.2 % (ref 0.0–5.0)
HCT: 44.2 % (ref 39.0–52.0)
Hemoglobin: 14.1 g/dL (ref 13.0–17.0)
LYMPHS ABS: 2 10*3/uL (ref 0.7–4.0)
LYMPHS PCT: 39.8 % (ref 12.0–46.0)
MCHC: 31.8 g/dL (ref 30.0–36.0)
MCV: 82.8 fl (ref 78.0–100.0)
MONOS PCT: 8.5 % (ref 3.0–12.0)
Monocytes Absolute: 0.4 10*3/uL (ref 0.1–1.0)
NEUTROS PCT: 46.9 % (ref 43.0–77.0)
Neutro Abs: 2.4 10*3/uL (ref 1.4–7.7)
PLATELETS: 262 10*3/uL (ref 150.0–400.0)
RBC: 5.33 Mil/uL (ref 4.22–5.81)
RDW: 14.8 % (ref 11.5–15.5)
WBC: 5 10*3/uL (ref 4.0–10.5)

## 2016-10-18 LAB — LIPID PANEL
CHOL/HDL RATIO: 5
Cholesterol: 230 mg/dL — ABNORMAL HIGH (ref 0–200)
HDL: 49.2 mg/dL (ref >39.00)
LDL CALC: 163 mg/dL — AB (ref 0–99)
NONHDL: 180.45
TRIGLYCERIDES: 87 mg/dL (ref 0.0–149.0)
VLDL: 17.4 mg/dL (ref 0.0–40.0)

## 2016-10-18 LAB — PSA: PSA: 1.03 ng/mL (ref 0.10–4.00)

## 2016-10-18 LAB — HEMOGLOBIN A1C: Hgb A1c MFr Bld: 6.1 % (ref 4.6–6.5)

## 2016-10-18 MED ORDER — ROSUVASTATIN CALCIUM 20 MG PO TABS: 20.0000 mg | ORAL_TABLET | Freq: Every day | ORAL | 1 refills | Status: DC

## 2016-10-18 MED ORDER — LISINOPRIL 10 MG PO TABS: 10.0000 mg | ORAL_TABLET | Freq: Every day | ORAL | 1 refills | Status: DC

## 2016-10-18 MED ORDER — LISINOPRIL 10 MG PO TABS: 10.0000 mg | ORAL_TABLET | Freq: Every day | ORAL | 0 refills | Status: DC

## 2016-10-18 MED ORDER — AMLODIPINE BESYLATE 10 MG PO TABS: 10.0000 mg | ORAL_TABLET | Freq: Every day | ORAL | 0 refills | Status: DC

## 2016-10-18 NOTE — Progress Notes (Signed)
Pre visit review using our clinic review tool, if applicable. No additional management support is needed unless otherwise documented below in the visit note. 

## 2016-10-18 NOTE — Assessment & Plan Note (Signed)
Repeat labs today. Medication refilled. Diet and exercise recommendations reviewed.

## 2016-10-18 NOTE — Assessment & Plan Note (Signed)
Patient has not taken medications today. Asymptomatic.  Discussed with patient that it is very hard to assess effectiveness of BP medications if he is not taking them on days he comes in. Will restart medications as directed. 2 week follow-up scheduled for reassessment of BP and to make further changes if necessary.

## 2016-10-18 NOTE — Patient Instructions (Signed)
Please go to the lab for blood work.   Our office will call you with your results unless you have chosen to receive results via MyChart.  If your blood work is normal we will follow-up each year for physicals and as scheduled for chronic medical problems.  If anything is abnormal we will treat accordingly and get you in for a follow-up.  Please resume medications as directed. I am changing you to amlodipine 10 mg tablets so you only have to take 1 daily.  You will be contacted for assessment by Dermatology. If you do not hear from them within a week or so, please give [name] a call.     Preventive Care 40-64 Years, Male Preventive care refers to lifestyle choices and visits with your health care provider that can promote health and wellness. What does preventive care include?  A yearly physical exam. This is also called an annual well check.  Dental exams once or twice a year.  Routine eye exams. Ask your health care provider how often you should have your eyes checked.  Personal lifestyle choices, including: ? Daily care of your teeth and gums. ? Regular physical activity. ? Eating a healthy diet. ? Avoiding tobacco and drug use. ? Limiting alcohol use. ? Practicing safe sex. ? Taking low-dose aspirin every day starting at age 9. What happens during an annual well check? The services and screenings done by your health care provider during your annual well check will depend on your age, overall health, lifestyle risk factors, and family history of disease. Counseling Your health care provider may ask you questions about your:  Alcohol use.  Tobacco use.  Drug use.  Emotional well-being.  Home and relationship well-being.  Sexual activity.  Eating habits.  Work and work Statistician.  Screening You may have the following tests or measurements:  Height, weight, and BMI.  Blood pressure.  Lipid and cholesterol levels. These may be checked every 5 years, or more  frequently if you are over 72 years old.  Skin check.  Lung cancer screening. You may have this screening every year starting at age 23 if you have a 30-pack-year history of smoking and currently smoke or have quit within the past 15 years.  Fecal occult blood test (FOBT) of the stool. You may have this test every year starting at age 23.  Flexible sigmoidoscopy or colonoscopy. You may have a sigmoidoscopy every 5 years or a colonoscopy every 10 years starting at age 98.  Prostate cancer screening. Recommendations will vary depending on your family history and other risks.  Hepatitis C blood test.  Hepatitis B blood test.  Sexually transmitted disease (STD) testing.  Diabetes screening. This is done by checking your blood sugar (glucose) after you have not eaten for a while (fasting). You may have this done every 1-3 years.  Discuss your test results, treatment options, and if necessary, the need for more tests with your health care provider. Vaccines Your health care provider may recommend certain vaccines, such as:  Influenza vaccine. This is recommended every year.  Tetanus, diphtheria, and acellular pertussis (Tdap, Td) vaccine. You may need a Td booster every 10 years.  Varicella vaccine. You may need this if you have not been vaccinated.  Zoster vaccine. You may need this after age 67.  Measles, mumps, and rubella (MMR) vaccine. You may need at least one dose of MMR if you were born in 1957 or later. You may also need a second dose.  Pneumococcal  13-valent conjugate (PCV13) vaccine. You may need this if you have certain conditions and have not been vaccinated.  Pneumococcal polysaccharide (PPSV23) vaccine. You may need one or two doses if you smoke cigarettes or if you have certain conditions.  Meningococcal vaccine. You may need this if you have certain conditions.  Hepatitis A vaccine. You may need this if you have certain conditions or if you travel or work in places  where you may be exposed to hepatitis A.  Hepatitis B vaccine. You may need this if you have certain conditions or if you travel or work in places where you may be exposed to hepatitis B.  Haemophilus influenzae type b (Hib) vaccine. You may need this if you have certain risk factors.  Talk to your health care provider about which screenings and vaccines you need and how often you need them. This information is not intended to replace advice given to you by your health care provider. Make sure you discuss any questions you have with your health care provider. Document Released: 03/17/2015 Document Revised: 11/08/2015 Document Reviewed: 12/20/2014 Elsevier Interactive Patient Education  2017 [employer].

## 2016-10-18 NOTE — Progress Notes (Signed)
Patient presents to clinic today for annual exam.  Patient is fasting for labs. Diet -- Endorses well-balanced diet overall. Drinks mostly water and tea.  Exercise -- Is walking 45 minutes daily and working out at the gym 2 hours per week -- mixture of cardio and resistance.   Acute Concerns: Patient is requesting a referral to Dermatology. Denies acute concerns today.  Chronic Issues: Hypertension -- Currently on a regimen of lisinopril 10 mg daily and amlodipine 10 mg daily. Is taking daily as directed but has not taken yet today. Patient denies chest pain, palpitations, lightheadedness, dizziness, vision changes or frequent headaches.  BP Readings from Last 3 Encounters:  10/18/16 (!) 138/98  02/02/16 (!) 143/84  05/24/15 (!) 150/90   Hyperlipidemia -- Currently on Cresto 20 mg daily. Is taking as directed. Has been out of medication for 2 weeks. Is working hard on diet and exercise as noted above.   Health Maintenance: Immunizations -- declines flu shot.   Past Medical History:  Diagnosis Date  . Cardiomyopathy   . Chicken pox   . Gout   . Hyperglycemia   . Hyperlipidemia   . Hypertension   . Hypogonadism in male   . Toxic effect of venom of bees     Past Surgical History:  Procedure Laterality Date  . HAND SURGERY     Right    No current outpatient prescriptions on file prior to visit.   No current facility-administered medications on file prior to visit.     Allergies  Allergen Reactions  . Bee Venom Swelling    Family History  Problem Relation Age of Onset  . Coronary artery disease Mother        Living  . Diabetes Mother   . Hyperlipidemia Mother   . Hypertension Mother   . Hypertension Father        Living  . Heart disease Father   . Diabetes Maternal Grandmother   . Kidney failure Maternal Grandmother   . Healthy Brother        x1  . Healthy Sister        x2  . Healthy Son        x2    Social History   Social History  . Marital  status: Married    Spouse name: N/A  . Number of children: N/A  . Years of education: N/A   Occupational History  . Not on file.   Social History Main Topics  . Smoking status: Never Smoker  . Smokeless tobacco: Never Used  . Alcohol use Yes     Comment: social  . Drug use: No  . Sexual activity: Not on file   Other Topics Concern  . Not on file   Social History Narrative     Married - 3 children      Never Smoked      Alcohol use-yes  (social)      Occupation:  works for [employer] [place]  Constitutional: Negative for fever and weight loss.  HENT: Negative for ear discharge, ear pain, hearing loss and tinnitus.   Eyes: Negative for blurred vision, double vision, photophobia and pain.  Respiratory: Negative for cough and shortness of breath.   Cardiovascular: Negative for chest pain and palpitations.  Gastrointestinal: Negative for abdominal pain, blood in stool, constipation, diarrhea, heartburn, melena, nausea and vomiting.  Genitourinary: Negative for dysuria, flank pain,  frequency, hematuria and urgency.  Musculoskeletal: Negative for falls.  Neurological: Negative for dizziness, loss of consciousness and headaches.  Endo/Heme/Allergies: Negative for environmental allergies.  Psychiatric/Behavioral: Negative for depression, hallucinations, substance abuse and suicidal ideas. The patient is not nervous/anxious and does not have insomnia.    BP (!) 138/98   Pulse 78   Temp 98.5 F (36.9 C) (Oral)   Resp 14   Ht 5\' 9"  (1.753 m)   Wt 223 lb (101.2 kg)   SpO2 95%   BMI 32.93 kg/m   Physical Exam  Constitutional: He is oriented to person, place, and time and well-developed, well-nourished, and in no distress.  HENT:  Head: Normocephalic and atraumatic.  Right Ear: External ear normal.  Left Ear: External ear normal.  Nose: Nose normal.  Mouth/Throat: Oropharynx is clear and moist. No oropharyngeal exudate.  Eyes: Pupils are  equal, round, and reactive to light. Conjunctivae and EOM are normal.  Neck: Neck supple. No thyromegaly present.  Cardiovascular: Normal rate, regular rhythm, normal heart sounds and intact distal pulses.   Pulmonary/Chest: Effort normal and breath sounds normal. No respiratory distress. He has no wheezes. He has no rales. He exhibits no tenderness.  Abdominal: Soft. Bowel sounds are normal. He exhibits no distension and no mass. There is no tenderness. There is no rebound and no guarding.  Genitourinary: Testes/scrotum normal.  Lymphadenopathy:    He has no cervical adenopathy.  Neurological: He is alert and oriented to person, place, and time.  Skin: Skin is warm and dry. No rash noted.  Psychiatric: Affect normal.  Vitals reviewed.  Assessment/Plan: Visit for preventive health examination Depression screen negative. Health Maintenance reviewed. Preventive schedule discussed and handout given in AVS. Will obtain fasting labs today.   Prostate cancer screening The natural history of prostate cancer and ongoing controversy regarding screening and potential treatment outcomes of prostate cancer has been discussed with the patient. The meaning of a false positive PSA and a false negative PSA has been discussed. He indicates understanding of the limitations of this screening test and wishes to proceed with screening PSA testing.   Hyperlipidemia Repeat labs today. Medication refilled. Diet and exercise recommendations reviewed.  Essential hypertension Patient has not taken medications today. Asymptomatic.  Discussed with patient that it is very hard to assess effectiveness of BP medications if he is not taking them on days he comes in. Will restart medications as directed. 2 week follow-up scheduled for reassessment of BP and to make further changes if necessary.     Leeanne Rio, PA-C

## 2016-10-18 NOTE — Assessment & Plan Note (Signed)
The natural history of prostate cancer and ongoing controversy regarding screening and potential treatment outcomes of prostate cancer has been discussed with the patient. The meaning of a false positive PSA and a false negative PSA has been discussed. He indicates understanding of the limitations of this screening test and wishes  to proceed with screening PSA testing.  

## 2016-10-18 NOTE — Assessment & Plan Note (Signed)
Depression screen negative. Health Maintenance reviewed. Preventive schedule discussed and handout given in AVS. Will obtain fasting labs today.  

## 2016-10-21 ENCOUNTER — Other Ambulatory Visit: Payer: Self-pay | Admitting: Physician Assistant

## 2016-10-21 DIAGNOSIS — E785 Hyperlipidemia, unspecified: Secondary | ICD-10-CM

## 2016-10-21 MED ORDER — ROSUVASTATIN CALCIUM 20 MG PO TABS: 20.0000 mg | ORAL_TABLET | Freq: Two times a day (BID) | ORAL | 1 refills | Status: DC

## 2016-11-08 ENCOUNTER — Encounter: Payer: Self-pay | Admitting: Physician Assistant

## 2016-11-08 ENCOUNTER — Ambulatory Visit (INDEPENDENT_AMBULATORY_CARE_PROVIDER_SITE_OTHER): Payer: Managed Care, Other (non HMO) | Admitting: Physician Assistant

## 2016-11-08 DIAGNOSIS — I10 Essential (primary) hypertension: Secondary | ICD-10-CM | POA: Diagnosis not present

## 2016-11-08 NOTE — Patient Instructions (Signed)
Please continue current medications as directed. Keep up with the DASH diet. When checking BP at home, rest for 5-10 minutes before checking. Check BP daily over the weekend this way. Call me with your readings on Monday.   DASH Eating Plan DASH stands for "Dietary Approaches to Stop Hypertension." The DASH eating plan is a healthy eating plan that has been shown to reduce high blood pressure (hypertension). It may also reduce your risk for type 2 diabetes, heart disease, and stroke. The DASH eating plan may also help with weight loss. What are tips for following this plan? General guidelines  Avoid eating more than 2,300 mg (milligrams) of salt (sodium) a day. If you have hypertension, you may need to reduce your sodium intake to 1,500 mg a day.  Limit alcohol intake to no more than 1 drink a day for nonpregnant women and 2 drinks a day for men. One drink equals 12 oz of beer, 5 oz of wine, or 1 oz of hard liquor.  Work with your health care provider to maintain a healthy body weight or to lose weight. Ask what an ideal weight is for you.  Get at least 30 minutes of exercise that causes your heart to beat faster (aerobic exercise) most days of the week. Activities may include walking, swimming, or biking.  Work with your health care provider or diet and nutrition specialist (dietitian) to adjust your eating plan to your individual calorie needs. Reading food labels  Check food labels for the amount of sodium per serving. Choose foods with less than 5 percent of the Daily Value of sodium. Generally, foods with less than 300 mg of sodium per serving fit into this eating plan.  To find whole grains, look for the word "whole" as the first word in the ingredient list. Shopping  Buy products labeled as "low-sodium" or "no salt added."  Buy fresh foods. Avoid canned foods and premade or frozen meals. Cooking  Avoid adding salt when cooking. Use salt-free seasonings or herbs instead of  table salt or sea salt. Check with your health care provider or pharmacist before using salt substitutes.  Do not fry foods. Cook foods using healthy methods such as baking, boiling, grilling, and broiling instead.  Cook with heart-healthy oils, such as olive, canola, soybean, or sunflower oil. Meal planning   Eat a balanced diet that includes: ? 5 or more servings of fruits and vegetables each day. At each meal, try to fill half of your plate with fruits and vegetables. ? Up to 6-8 servings of whole grains each day. ? Less than 6 oz of lean meat, poultry, or fish each day. A 3-oz serving of meat is about the same size as a deck of cards. One egg equals 1 oz. ? 2 servings of low-fat dairy each day. ? A serving of nuts, seeds, or beans 5 times each week. ? Heart-healthy fats. Healthy fats called Omega-3 fatty acids are found in foods such as flaxseeds and coldwater fish, like sardines, salmon, and mackerel.  Limit how much you eat of the following: ? Canned or prepackaged foods. ? Food that is high in trans fat, such as fried foods. ? Food that is high in saturated fat, such as fatty meat. ? Sweets, desserts, sugary drinks, and other foods with added sugar. ? Full-fat dairy products.  Do not salt foods before eating.  Try to eat at least 2 vegetarian meals each week.  Eat more home-cooked food and less restaurant, buffet, and fast  food.  When eating at a restaurant, ask that your food be prepared with less salt or no salt, if possible. What foods are recommended? The items listed may not be a complete list. Talk with your dietitian about what dietary choices are best for you. Grains Whole-grain or whole-wheat bread. Whole-grain or whole-wheat pasta. Brown rice. [place]. Bulgur. Whole-grain and low-sodium cereals. Pita bread. Low-fat, low-sodium crackers. Whole-wheat flour tortillas. Vegetables Fresh or frozen vegetables (raw, steamed, roasted, or grilled). Low-sodium or  reduced-sodium tomato and vegetable juice. Low-sodium or reduced-sodium tomato sauce and tomato paste. Low-sodium or reduced-sodium canned vegetables. Fruits All fresh, dried, or frozen fruit. Canned fruit in natural juice (without added sugar). Meat and other protein foods Skinless chicken or [place]. Ground chicken or [place]. Pork with fat trimmed off. Fish and seafood. Egg whites. Dried beans, peas, or lentils. Unsalted nuts, nut butters, and seeds. Unsalted canned beans. Lean cuts of beef with fat trimmed off. Low-sodium, lean deli meat. Dairy Low-fat (1%) or fat-free (skim) milk. Fat-free, low-fat, or reduced-fat cheeses. Nonfat, low-sodium ricotta or cottage cheese. Low-fat or nonfat yogurt. Low-fat, low-sodium cheese. Fats and oils Soft margarine without trans fats. Vegetable oil. Low-fat, reduced-fat, or light mayonnaise and salad dressings (reduced-sodium). Canola, safflower, olive, soybean, and sunflower oils. Avocado. Seasoning and other foods Herbs. Spices. Seasoning mixes without salt. Unsalted popcorn and pretzels. Fat-free sweets. What foods are not recommended? The items listed may not be a complete list. Talk with your dietitian about what dietary choices are best for you. Grains Baked goods made with fat, such as croissants, muffins, or some breads. Dry pasta or rice meal packs. Vegetables Creamed or fried vegetables. Vegetables in a cheese sauce. Regular canned vegetables (not low-sodium or reduced-sodium). Regular canned tomato sauce and paste (not low-sodium or reduced-sodium). Regular tomato and vegetable juice (not low-sodium or reduced-sodium). Angie Fava. Olives. Fruits Canned fruit in a light or heavy syrup. Fried fruit. Fruit in cream or butter sauce. Meat and other protein foods Fatty cuts of meat. Ribs. Fried meat. Berniece Salines. Sausage. Bologna and other processed lunch meats. Salami. Fatback. Hotdogs. Bratwurst. Salted nuts and seeds. Canned beans with added salt. Canned or  smoked fish. Whole eggs or egg yolks. Chicken or [place] with skin. Dairy Whole or 2% milk, cream, and half-and-half. Whole or full-fat cream cheese. Whole-fat or sweetened yogurt. Full-fat cheese. Nondairy creamers. Whipped toppings. Processed cheese and cheese spreads. Fats and oils Butter. Stick margarine. Lard. Shortening. Ghee. Bacon fat. Tropical oils, such as coconut, palm kernel, or palm oil. Seasoning and other foods Salted popcorn and pretzels. Onion salt, garlic salt, seasoned salt, table salt, and sea salt. Worcestershire sauce. Tartar sauce. Barbecue sauce. Teriyaki sauce. Soy sauce, including reduced-sodium. Steak sauce. Canned and packaged gravies. Fish sauce. Oyster sauce. Cocktail sauce. Horseradish that you find on the shelf. Ketchup. Mustard. Meat flavorings and tenderizers. Bouillon cubes. Hot sauce and Tabasco sauce. Premade or packaged marinades. Premade or packaged taco seasonings. Relishes. Regular salad dressings. Where to find more information:  National Heart, Lung, and [place]: https://wilson-eaton.com/  American Heart Association: www.heart.org Summary  The DASH eating plan is a healthy eating plan that has been shown to reduce high blood pressure (hypertension). It may also reduce your risk for type 2 diabetes, heart disease, and stroke.  With the DASH eating plan, you should limit salt (sodium) intake to 2,300 mg a day. If you have hypertension, you may need to reduce your sodium intake to 1,500 mg a day.  When on the DASH  eating plan, aim to eat more fresh fruits and vegetables, whole grains, lean proteins, low-fat dairy, and heart-healthy fats.  Work with your health care provider or diet and nutrition specialist (dietitian) to adjust your eating plan to your individual calorie needs. This information is not intended to replace advice given to you by your health care provider. Make sure you discuss any questions you have with your health care provider. Document  Released: 02/07/2011 Document Revised: 02/12/2016 Document Reviewed: 02/12/2016 Elsevier Interactive Patient Education  2017 [employer].

## 2016-11-08 NOTE — Progress Notes (Signed)
Pre visit review using our clinic review tool, if applicable. No additional management support is needed unless otherwise documented below in the visit note. 

## 2016-11-08 NOTE — Progress Notes (Signed)
Patient presents to clinic today for 2-week follow-up regarding hypertension. At last visit medications were resumed as he had been out. Is taking lisinopril 10 mg daily and amlodipine 10 mg daily. Patient denies chest pain, palpitations, lightheadedness, dizziness, vision changes or frequent headaches. Is checking BP at home and getting readings 120-130s/90-98. Is checking as soon as he gets home.   BP Readings from Last 3 Encounters:  11/08/16 128/82  10/18/16 (!) 138/98  02/02/16 (!) 143/84    Past Medical History:  Diagnosis Date  . Cardiomyopathy   . Chicken pox   . Gout   . Hyperglycemia   . Hyperlipidemia   . Hypertension   . Hypogonadism in male   . Toxic effect of venom of bees     Current Outpatient Prescriptions on File Prior to Visit  Medication Sig Dispense Refill  . amLODipine (NORVASC) 10 MG tablet Take 1 tablet (10 mg total) by mouth daily. 90 tablet 0  . lisinopril (PRINIVIL,ZESTRIL) 10 MG tablet Take 1 tablet (10 mg total) by mouth daily. 90 tablet 1  . rosuvastatin (CRESTOR) 20 MG tablet Take 1 tablet (20 mg total) by mouth 2 (two) times daily. 90 tablet 1   No current facility-administered medications on file prior to visit.     Allergies  Allergen Reactions  . Bee Venom Swelling    Family History  Problem Relation Age of Onset  . Coronary artery disease Mother        Living  . Diabetes Mother   . Hyperlipidemia Mother   . Hypertension Mother   . Hypertension Father        Living  . Heart disease Father   . Diabetes Maternal Grandmother   . Kidney failure Maternal Grandmother   . Healthy Brother        x1  . Healthy Sister        x2  . Healthy Son        x2    Social History   Social History  . Marital status: Married    Spouse name: N/A  . Number of children: N/A  . Years of education: N/A   Social History Main Topics  . Smoking status: Never Smoker  . Smokeless tobacco: Never Used  . Alcohol use Yes     Comment: social  .  Drug use: No  . Sexual activity: Not Asked   Other Topics Concern  . None   Social History Narrative     Married - 3 children      Never Smoked      Alcohol use-yes  (social)      Occupation:  works for [employer] [place] - See HPI.  All other ROS are negative.  BP 128/82 (BP Location: Left Arm, Cuff Size: Normal)   Pulse 88   Temp 98.5 F (36.9 C) (Oral)   Resp 14   Ht 5' 9"  (1.753 m)   Wt 228 lb (103.4 kg)   SpO2 96%   BMI 33.67 kg/m   Physical Exam  Constitutional: He is oriented to person, place, and time and well-developed, well-nourished, and in no distress.  HENT:  Head: Normocephalic and atraumatic.  Eyes: Conjunctivae are normal.  Neck: Neck supple.  Cardiovascular: Normal rate, regular rhythm, normal heart sounds and intact distal pulses.   Pulmonary/Chest: Effort normal and breath sounds normal. No respiratory distress. He has no wheezes. He  has no rales. He exhibits no tenderness.  Neurological: He is alert and oriented to person, place, and time.  Skin: Skin is warm and dry. No rash noted.  Vitals reviewed.  Recent Results (from the past 2160 hour(s))  CBC with Differential/Platelet     Status: None   Collection Time: 10/18/16 10:10 AM  Result Value Ref Range   WBC 5.0 4.0 - 10.5 K/uL   RBC 5.33 4.22 - 5.81 Mil/uL   Hemoglobin 14.1 13.0 - 17.0 g/dL   HCT 44.2 39.0 - 52.0 %   MCV 82.8 78.0 - 100.0 fl   MCHC 31.8 30.0 - 36.0 g/dL   RDW 14.8 11.5 - 15.5 %   Platelets 262.0 150.0 - 400.0 K/uL   Neutrophils Relative % 46.9 43.0 - 77.0 %   Lymphocytes Relative 39.8 12.0 - 46.0 %   Monocytes Relative 8.5 3.0 - 12.0 %   Eosinophils Relative 4.2 0.0 - 5.0 %   Basophils Relative 0.6 0.0 - 3.0 %   Neutro Abs 2.4 1.4 - 7.7 K/uL   Lymphs Abs 2.0 0.7 - 4.0 K/uL   Monocytes Absolute 0.4 0.1 - 1.0 K/uL   Eosinophils Absolute 0.2 0.0 - 0.7 K/uL   Basophils Absolute 0.0 0.0 - 0.1 K/uL  PSA     Status: None   Collection Time:  10/18/16 10:10 AM  Result Value Ref Range   PSA 1.03 0.10 - 4.00 ng/mL    Comment: Test performed using Access Hybritech PSA Assay, a parmagnetic partical, chemiluminecent immunoassay.  Lipid panel     Status: Abnormal   Collection Time: 10/18/16 10:10 AM  Result Value Ref Range   Cholesterol 230 (H) 0 - 200 mg/dL    Comment: ATP III Classification       Desirable:  < 200 mg/dL               Borderline High:  200 - 239 mg/dL          High:  > = 240 mg/dL   Triglycerides 87.0 0.0 - 149.0 mg/dL    Comment: Normal:  <150 mg/dLBorderline High:  150 - 199 mg/dL   HDL 49.20 >39.00 mg/dL   VLDL 17.4 0.0 - 40.0 mg/dL   LDL Cholesterol 163 (H) 0 - 99 mg/dL   Total CHOL/HDL Ratio 5     Comment:                Men          Women1/2 Average Risk     3.4          3.3Average Risk          5.0          4.42X Average Risk          9.6          7.13X Average Risk          15.0          11.0                       NonHDL 180.45     Comment: NOTE:  Non-HDL goal should be 30 mg/dL higher than patient's LDL goal (i.e. LDL goal of < 70 mg/dL, would have non-HDL goal of < 100 mg/dL)  Urinalysis, Routine w reflex microscopic     Status: Abnormal   Collection Time: 10/18/16 10:10 AM  Result Value Ref Range   Color, Urine YELLOW Yellow;Lt. Yellow  APPearance CLEAR Clear   Specific Gravity, Urine 1.025 1.000 - 1.030   pH 6.0 5.0 - 8.0   Total Protein, Urine TRACE (A) Negative   Urine Glucose NEGATIVE Negative   Ketones, ur NEGATIVE Negative   Bilirubin Urine NEGATIVE Negative   Hgb urine dipstick NEGATIVE Negative   Urobilinogen, UA 0.2 0.0 - 1.0   Leukocytes, UA NEGATIVE Negative   Nitrite NEGATIVE Negative   WBC, UA none seen 0-2/hpf   RBC / HPF none seen 0-2/hpf  Hemoglobin A1c     Status: None   Collection Time: 10/18/16 10:10 AM  Result Value Ref Range   Hgb A1c MFr Bld 6.1 4.6 - 6.5 %    Comment: Glycemic Control Guidelines for People with Diabetes:Non Diabetic:  <6%Goal of Therapy: <7%Additional  Action Suggested:  >8%   Comp Met (CMET)     Status: None   Collection Time: 10/18/16 10:10 AM  Result Value Ref Range   Sodium 141 135 - 145 mEq/L   Potassium 4.2 3.5 - 5.1 mEq/L   Chloride 106 96 - 112 mEq/L   CO2 29 19 - 32 mEq/L   Glucose, Bld 92 70 - 99 mg/dL   BUN 14 6 - 23 mg/dL   Creatinine, Ser 1.40 0.40 - 1.50 mg/dL   Total Bilirubin 0.5 0.2 - 1.2 mg/dL   Alkaline Phosphatase 54 39 - 117 U/L   AST 26 0 - 37 U/L   ALT 17 0 - 53 U/L   Total Protein 7.3 6.0 - 8.3 g/dL   Albumin 4.2 3.5 - 5.2 g/dL   Calcium 9.5 8.4 - 10.5 mg/dL   GFR 70.14 >60.00 mL/min    Assessment/Plan: Essential hypertension BP improved on recheck. Continue DASH diet and current regimen. Discussed appropriate way to check home BP. He will check daily over weekend and call me with the readings.     Leeanne Rio, PA-C

## 2016-11-08 NOTE — Assessment & Plan Note (Signed)
BP improved on recheck. Continue DASH diet and current regimen. Discussed appropriate way to check home BP. He will check daily over weekend and call me with the readings.

## 2016-12-27 ENCOUNTER — Encounter: Payer: Self-pay | Admitting: Family Medicine

## 2016-12-27 ENCOUNTER — Ambulatory Visit (INDEPENDENT_AMBULATORY_CARE_PROVIDER_SITE_OTHER): Payer: Managed Care, Other (non HMO) | Admitting: Family Medicine

## 2016-12-27 VITALS — BP 178/104 | HR 87 | Temp 98.7°F | Ht 69.0 in | Wt 229.4 lb

## 2016-12-27 DIAGNOSIS — R030 Elevated blood-pressure reading, without diagnosis of hypertension: Secondary | ICD-10-CM

## 2016-12-27 DIAGNOSIS — J0100 Acute maxillary sinusitis, unspecified: Secondary | ICD-10-CM | POA: Diagnosis not present

## 2016-12-27 MED ORDER — AMOXICILLIN-POT CLAVULANATE 875-125 MG PO TABS: 1.0000 | ORAL_TABLET | Freq: Two times a day (BID) | ORAL | 0 refills | Status: DC

## 2016-12-27 NOTE — Progress Notes (Signed)
Pre visit review using our clinic review tool, if applicable. No additional management support is needed unless otherwise documented below in the visit note. 

## 2016-12-27 NOTE — Progress Notes (Signed)
Chief Complaint  Patient presents with  . Sinusitis  . Ear Pain  . Fatigue  . Generalized Body Aches    Bruce Harris here for URI complaints.  Duration: 9 days Associated symptoms: sinus congestion, myalgias, sinus pain, rhinorrhea, ear pain, sore throat and fevers Denies: SOB,cough, dental pain, ear drainage and rigors Treatment to date: Mucinex, Phenylephrine Sick contacts: Yes  ROS:  Const: +fevers HEENT: As noted in HPI Lungs: No SOB  Past Medical History:  Diagnosis Date  . Cardiomyopathy   . Chicken pox   . Gout   . Hyperglycemia   . Hyperlipidemia   . Hypertension   . Hypogonadism in male   . Toxic effect of venom of bees    Family History  Problem Relation Age of Onset  . Coronary artery disease Mother        Living  . Diabetes Mother   . Hyperlipidemia Mother   . Hypertension Mother   . Hypertension Father        Living  . Heart disease Father   . Diabetes Maternal Grandmother   . Kidney failure Maternal Grandmother   . Healthy Brother        x1  . Healthy Sister        x2  . Healthy Son        x2    BP (!) 178/104 (BP Location: Left Arm, Patient Position: Sitting, Cuff Size: Large)   Pulse 87   Temp 98.7 F (37.1 C) (Oral)   Ht 5\' 9"  (1.753 m)   Wt 229 lb 6 oz (104 kg)   SpO2 96%   BMI 33.87 kg/m  General: Awake, alert, appears stated age HEENT: AT, , ears patent b/l and TM's neg, nares patent w/o discharge, pharynx pink and without exudates, max sinuses TTP b/lMMM Neck: No masses or asymmetry Heart: RRR, no murmurs, no bruits Lungs: CTAB, no accessory muscle use Psych: Age appropriate judgment and insight, normal mood and affect  Acute non-recurrent maxillary sinusitis - Plan: amoxicillin-clavulanate (AUGMENTIN) 875-125 MG tablet  Elevated blood pressure reading  Orders as above. As he has been worsening for 3 consec days, will tx.  Continue to push fluids, practice good hand hygiene, cover mouth when coughing. F/u prn. If  starting to experience fevers, shaking, or shortness of breath, seek immediate care. Pt voiced understanding and agreement to the plan.  [place], DO 12/27/16 2:30 PM

## 2016-12-27 NOTE — Patient Instructions (Addendum)
Continue to push fluids, practice good hand hygiene, and cover your mouth if you cough.  If you start having fevers, shaking or shortness of breath, seek immediate care.  Try to stay off the decongestants.   Let us know if you need anything.

## 2017-01-30 ENCOUNTER — Encounter: Payer: Self-pay | Admitting: Family Medicine

## 2017-01-30 ENCOUNTER — Ambulatory Visit: Payer: Managed Care, Other (non HMO) | Admitting: Family Medicine

## 2017-01-30 VITALS — BP 152/102 | HR 93 | Temp 98.3°F | Ht 68.5 in | Wt 232.4 lb

## 2017-01-30 DIAGNOSIS — H1031 Unspecified acute conjunctivitis, right eye: Secondary | ICD-10-CM | POA: Diagnosis not present

## 2017-01-30 DIAGNOSIS — R030 Elevated blood-pressure reading, without diagnosis of hypertension: Secondary | ICD-10-CM

## 2017-01-30 MED ORDER — POLYMYXIN B-TRIMETHOPRIM 10000-0.1 UNIT/ML-% OP SOLN: 1.0000 [drp] | OPHTHALMIC | 0 refills | Status: DC

## 2017-01-30 MED FILL — POLYMYXIN B/TMP EYE DROPS: 10000-0.1 | 33 days supply | Qty: 10 | Fill #0

## 2017-01-30 NOTE — Progress Notes (Signed)
Chief Complaint  Patient presents with  . Conjunctivitis    Bruce Harris is here for right eye irritation.  Duration: 5 days Chemical exposure? No  Recent URI? No  Contact lenses? No  History of allergies? No  Treatment to date: Warm compresses, Visine  Hx of HTN, on Linsinopril 10 mg/d. Did not take meds today. Have been a little high at home. No CP or SOB.  ROS:  Eyes: As noted above  Past Medical History:  Diagnosis Date  . Cardiomyopathy   . Chicken pox   . Gout   . Hyperglycemia   . Hyperlipidemia   . Hypertension   . Hypogonadism in male   . Toxic effect of venom of bees      BP (!) 152/102 (BP Location: Left Arm, Patient Position: Sitting, Cuff Size: Large)   Pulse 93   Temp 98.3 F (36.8 C) (Oral)   Ht 5' 8.5" (1.74 m)   Wt 232 lb 6 oz (105.4 kg)   SpO2 97%   BMI 34.82 kg/m  Gen: Awake, alert, appears stated age Eyes: Lids erythematous on R, nml on L Sclera injected on R, PERRLA, EOMi, no TTP to light touch Nose: Nares patent without discharge Mouth: MMM, pharynx without erythema or exudate Psych: Age appropriate judgment and insight; mood and affect normal  Acute conjunctivitis of right eye, unspecified acute conjunctivitis type - Plan: trimethoprim-polymyxin b (POLYTRIM) ophthalmic solution  Elevated blood pressure reading  Orders as above. Instructed to practice good hand hygiene and try not to touch face. Warm compresses and artificial tears also recommended. Ck home BP's, if still elevated, return to clinic.  F/u if no improvement in 7-10 days. Pt voiced understanding and agreement to the plan.  [place], DO 01/30/17 11:00 AM

## 2017-01-30 NOTE — Patient Instructions (Signed)
Artificial tears like Refresh and Systane may be used for comfort. OK to get generic version. Generally people use them every 2-4 hours, but you can use them as much as you want because there is no medication in it.  Continue warm compresses.  Keep checking blood pressure over next several days, if still elevated, schedule appt.

## 2017-01-30 NOTE — Progress Notes (Signed)
Pre visit review using our clinic review tool, if applicable. No additional management support is needed unless otherwise documented below in the visit note. 

## 2017-01-31 ENCOUNTER — Ambulatory Visit: Payer: Self-pay | Admitting: Family

## 2017-02-12 ENCOUNTER — Ambulatory Visit: Payer: Self-pay | Admitting: Family Medicine

## 2017-02-21 ENCOUNTER — Ambulatory Visit: Payer: Self-pay | Admitting: Family Medicine

## 2017-02-28 ENCOUNTER — Encounter: Payer: Self-pay | Admitting: Family Medicine

## 2017-02-28 ENCOUNTER — Ambulatory Visit (INDEPENDENT_AMBULATORY_CARE_PROVIDER_SITE_OTHER): Payer: Managed Care, Other (non HMO) | Admitting: Family Medicine

## 2017-02-28 VITALS — BP 145/96 | HR 84 | Temp 98.8°F | Resp 16 | Ht 68.0 in | Wt 230.0 lb

## 2017-02-28 DIAGNOSIS — I10 Essential (primary) hypertension: Secondary | ICD-10-CM

## 2017-02-28 MED ORDER — LISINOPRIL 20 MG PO TABS: 20.0000 mg | ORAL_TABLET | Freq: Every day | ORAL | 3 refills | Status: DC

## 2017-02-28 NOTE — Patient Instructions (Addendum)
Aim to do some physical exertion for 150 minutes per week. This is typically divided into 5 days per week, 30 minutes per day. The activity should be enough to get your heart rate up. Anything is better than nothing if you have time constraints.  Take 2 tabs of lisinopril 10 mg until you run out. A new 20 mg tab has been called to your pharmacy.   Keep the diet clean.  Keep checking BP's at home.   Let us know if you need anything.

## 2017-02-28 NOTE — Progress Notes (Signed)
Chief Complaint  Patient presents with  . Transitions Of Care    Form [facility]     Subjective Bruce Harris is a 46 y.o. male who presents for hypertension follow up. He does monitor home blood pressures. Blood pressures ranging from 130-140's/90-100's on average. He is compliant with medications. Norvasc 10 mg/d, Lisinopril 10 mg/d Patient has these side effects of medication: none He is not adhering to a healthy diet overall. Current exercise: none recently; usually will walk   Past Medical History:  Diagnosis Date  . Cardiomyopathy   . Chicken pox   . Gout   . Hyperglycemia   . Hyperlipidemia   . Hypertension   . Hypogonadism in male   . Toxic effect of venom of bees    Review of Systems Cardiovascular: no chest pain Respiratory:  no shortness of breath  Exam BP (!) 145/96   Pulse 84   Temp 98.8 F (37.1 C) (Oral)   Resp 16   Ht 5\' 8"  (1.727 m)   Wt 230 lb (104.3 kg)   SpO2 97%   BMI 34.97 kg/m  General:  well developed, well nourished, in no apparent distress Skin: warm, no pallor or diaphoresis Eyes: pupils equal and round, sclera anicteric without injection Heart: RRR, no bruits, no LE edema Lungs: clear to auscultation, no accessory muscle use Psych: well oriented with normal range of affect and appropriate judgment/insight  Essential hypertension - Plan: lisinopril (PRINIVIL,ZESTRIL) 20 MG tablet  Increase dose of lisinopril from 10 mg/d to 20 mg/d Counseled on diet and exercise F/u in 1 mo. The patient voiced understanding and agreement to the plan.  [place], DO 02/28/17  8:43 AM

## 2017-03-26 ENCOUNTER — Encounter: Payer: Self-pay | Admitting: Family Medicine

## 2017-03-26 ENCOUNTER — Ambulatory Visit: Payer: Managed Care, Other (non HMO) | Admitting: Family Medicine

## 2017-03-26 VITALS — BP 152/94 | HR 84 | Temp 98.4°F | Ht 69.0 in | Wt 226.4 lb

## 2017-03-26 DIAGNOSIS — R0681 Apnea, not elsewhere classified: Secondary | ICD-10-CM | POA: Diagnosis not present

## 2017-03-26 DIAGNOSIS — K2900 Acute gastritis without bleeding: Secondary | ICD-10-CM

## 2017-03-26 DIAGNOSIS — R030 Elevated blood-pressure reading, without diagnosis of hypertension: Secondary | ICD-10-CM

## 2017-03-26 MED ORDER — ONDANSETRON HCL 4 MG PO TABS: 4.0000 mg | ORAL_TABLET | Freq: Three times a day (TID) | ORAL | 0 refills | Status: DC | PRN

## 2017-03-26 MED ORDER — DICYCLOMINE HCL 10 MG PO CAPS: 10.0000 mg | ORAL_CAPSULE | Freq: Three times a day (TID) | ORAL | 0 refills | Status: DC

## 2017-03-26 NOTE — Progress Notes (Signed)
Chief Complaint  Patient presents with  . Nausea  . Fatigue  . Shortness of Breath     Subjective Bruce Harris is a 47 y.o. male who presents with vomiting w nausea Symptoms began 3 d ago Patient has vomiting, chills, myalgias and nausea Patient denies diarrhea, fever and recent travel Evaluation to date: no Sick contacts: none known  Past Medical History:  Diagnosis Date  . Cardiomyopathy   . Chicken pox   . Gout   . Hyperglycemia   . Hyperlipidemia   . Hypertension   . Hypogonadism in male   . Toxic effect of venom of bees     Review of Systems Constitutional:  No fevers or chills Gastrointestinal:  As noted in the HPI  Exam BP (!) 152/94 (BP Location: Left Arm, Patient Position: Sitting, Cuff Size: Normal)   Pulse 84   Temp 98.4 F (36.9 C) (Oral)   Ht 5\' 9"  (1.753 m)   Wt 226 lb 6 oz (102.7 kg)   SpO2 96%   BMI 33.43 kg/m  General:  well developed, well hydrated, in no apparent distress Skin:  warm, no pallor or diaphoresis, no rashes Throat/Pharynx:  lips and gingiva without lesion; tongue and uvula midline; non-inflamed pharynx; no exudates or postnasal drainage Neck: neck supple without adenopathy, thyromegaly, or masses Lungs:  clear to auscultation, breath sounds equal bilaterally, no respiratory distress, no wheezes Cardio:  regular rate and rhythm without murmurs Abdomen:  abdomen soft, nontender; bowel sounds normal; no masses or organomegaly Extremities:  no clubbing, cyanosis, or edema Psych: Appropriate judgement/insight  Assessment and Plan  Acute gastritis without hemorrhage, unspecified gastritis type - Plan: ondansetron (ZOFRAN) 4 MG tablet, dicyclomine (BENTYL) 10 MG capsule  Witnessed apneic spells - Plan: Ambulatory referral to Pulmonology  Elevated blood pressure reading  Orders as above. Avoid aggravating foods, discussed BRAT diet.  Refer sleep team for possible OSA. Keep eye on BP's, will re evaluate next week.  F/u if  symptoms fail to improve, sooner if worsening. The patient voiced understanding and agreement to the plan.  [place], DO 03/26/17  1:46 PM

## 2017-03-26 NOTE — Patient Instructions (Addendum)
Be fasting at your next appointment.   You likely have a viral infection in your stomach causing your issues. Push fluids and wash hands frequently.   If you do not hear anything about your referral in the next 1-2 weeks, call our office and ask for an update.  Let us know if you need anything.

## 2017-03-26 NOTE — Progress Notes (Signed)
Pre visit review using our clinic review tool, if applicable. No additional management support is needed unless otherwise documented below in the visit note. 

## 2017-04-04 ENCOUNTER — Encounter: Payer: Self-pay | Admitting: Family Medicine

## 2017-04-04 ENCOUNTER — Ambulatory Visit (INDEPENDENT_AMBULATORY_CARE_PROVIDER_SITE_OTHER): Payer: Managed Care, Other (non HMO) | Admitting: Family Medicine

## 2017-04-04 VITALS — BP 128/84 | HR 84 | Temp 98.7°F | Ht 69.0 in | Wt 226.2 lb

## 2017-04-04 DIAGNOSIS — E785 Hyperlipidemia, unspecified: Secondary | ICD-10-CM

## 2017-04-04 DIAGNOSIS — I10 Essential (primary) hypertension: Secondary | ICD-10-CM

## 2017-04-04 LAB — LIPID PANEL
CHOL/HDL RATIO: 4
Cholesterol: 235 mg/dL — ABNORMAL HIGH (ref 0–200)
HDL: 54.5 mg/dL (ref >39.00)
LDL CALC: 162 mg/dL — AB (ref 0–99)
NonHDL: 180.51
TRIGLYCERIDES: 91 mg/dL (ref 0.0–149.0)
VLDL: 18.2 mg/dL (ref 0.0–40.0)

## 2017-04-04 LAB — HEPATIC FUNCTION PANEL
ALK PHOS: 59 U/L (ref 39–117)
ALT: 16 U/L (ref 0–53)
AST: 22 U/L (ref 0–37)
Albumin: 4.3 g/dL (ref 3.5–5.2)
BILIRUBIN DIRECT: 0.1 mg/dL (ref 0.0–0.3)
BILIRUBIN TOTAL: 0.6 mg/dL (ref 0.2–1.2)
TOTAL PROTEIN: 8.1 g/dL (ref 6.0–8.3)

## 2017-04-04 MED ORDER — AMLODIPINE BESYLATE 10 MG PO TABS: 10.0000 mg | ORAL_TABLET | Freq: Every day | ORAL | 1 refills | Status: DC

## 2017-04-04 MED ORDER — ROSUVASTATIN CALCIUM 20 MG PO TABS: 20.0000 mg | ORAL_TABLET | Freq: Two times a day (BID) | ORAL | 1 refills | Status: DC

## 2017-04-04 NOTE — Patient Instructions (Addendum)
Biotene mouthwash for dry mouth.  Keep up the good work.   OK to check BP once per week, once per day.   Let us know if you need anything.

## 2017-04-04 NOTE — Addendum Note (Signed)
Addended by: Caffie Pinto on: 04/04/2017 10:43 AM   Modules accepted: Orders

## 2017-04-04 NOTE — Progress Notes (Signed)
Pre visit review using our clinic review tool, if applicable. No additional management support is needed unless otherwise documented below in the visit note. 

## 2017-04-04 NOTE — Progress Notes (Signed)
Chief Complaint  Patient presents with  . Hypertension    Subjective Bruce Harris is a 47 y.o. male who presents for hypertension follow up. He does monitor home blood pressures. Blood pressures ranging from 120-130's/80's on average. He is compliant with medications- amlodipine 10 mg/d, Lisinopril 20 mg/d (increased from 10 mg/d). Patient has these side effects of medication: none He is not adhering to a healthy diet overall. Current exercise: walking when weather permits  Hyperlipidemia Patient presents for dyslipidemia follow up. Currently being treated with Crestor 20 mg/d and compliance with treatment thus far has been good. He denies myalgias. He is adhering to a healthy. The patient exercises several times per week.  The patient is not known to have coexisting coronary artery disease.    Past Medical History:  Diagnosis Date  . Cardiomyopathy   . Chicken pox   . Gout   . Hyperglycemia   . Hyperlipidemia   . Hypertension   . Hypogonadism in male   . Toxic effect of venom of bees    Review of Systems Cardiovascular: no chest pain Respiratory:  no shortness of breath  Exam BP 128/84 (BP Location: Left Arm, Patient Position: Sitting, Cuff Size: Large)   Pulse 84   Temp 98.7 F (37.1 C) (Oral)   Ht 5\' 9"  (1.753 m)   Wt 226 lb 4 oz (102.6 kg)   SpO2 95%   BMI 33.41 kg/m  General:  well developed, well nourished, in no apparent distress Skin: warm, no pallor or diaphoresis Eyes: pupils equal and round, sclera anicteric without injection Heart: RRR, no bruits, no LE edema Lungs: clear to auscultation, no accessory muscle use Psych: well oriented with normal range of affect and appropriate judgment/insight  Essential hypertension - Plan: amLODipine (NORVASC) 10 MG tablet  Hyperlipidemia, unspecified hyperlipidemia type - Plan: rosuvastatin (CRESTOR) 20 MG tablet  Orders as above. Cont lisinopril.  Counseled on diet and exercise F/u in 3 mo. The patient  voiced understanding and agreement to the plan.  [place], DO 04/04/17  8:35 AM

## 2017-04-28 ENCOUNTER — Encounter: Payer: Self-pay | Admitting: Pulmonary Disease

## 2017-04-28 ENCOUNTER — Ambulatory Visit: Payer: Managed Care, Other (non HMO) | Admitting: Pulmonary Disease

## 2017-04-28 VITALS — BP 160/108 | HR 91 | Ht 68.0 in | Wt 227.0 lb

## 2017-04-28 DIAGNOSIS — R29818 Other symptoms and signs involving the nervous system: Secondary | ICD-10-CM | POA: Diagnosis not present

## 2017-04-28 NOTE — Progress Notes (Signed)
   Subjective:    Patient ID: Bruce Harris, male    DOB: 02/01/1971, 47 y.o.   MRN: [id number]  HPI    Review of Systems  Constitutional: Negative for fever and unexpected weight change.  HENT: Positive for sinus pressure and sneezing. Negative for congestion, dental problem, ear pain, nosebleeds, postnasal drip, rhinorrhea, sore throat and trouble swallowing.   Eyes: Negative for redness and itching.  Respiratory: Negative for cough, chest tightness, shortness of breath and wheezing.   Cardiovascular: Negative for palpitations and leg swelling.  Gastrointestinal: Negative for nausea and vomiting.  Genitourinary: Negative for dysuria.  Musculoskeletal: Negative for joint swelling.  Skin: Negative for rash.  Allergic/Immunologic: Positive for environmental allergies. Negative for food allergies and immunocompromised state.  Neurological: Negative for headaches.  Hematological: Does not bruise/bleed easily.  Psychiatric/Behavioral: Negative for dysphoric mood. The patient is nervous/anxious.        Objective:   Physical Exam        Assessment & Plan:

## 2017-04-28 NOTE — Progress Notes (Signed)
[facility]  Chief Complaint  Patient presents with  . sleep consult    Pt referred by Dr. Riki Sheer MD. Pt states snores loudly, stops breathing at times, wake up not rested.    Vital signs: BP (!) 160/108 (BP Location: Left Arm, Cuff Size: Normal)   Pulse 91   Ht 5\' 8"  (1.727 m)   Wt 227 lb (103 kg)   SpO2 96%   BMI 34.52 kg/m   History of Present Illness: Bruce Harris is a 47 y.o. male for evaluation of sleep problems.  His wife has been concerned about his sleep.  He snores, and will stop breathing.  He has more trouble sleeping on his back.  He is a Proofreader.  He has hypertension on several medications.    He goes to sleep at 10 pm.  He falls asleep quickly.  He wakes up 2 times to use the bathroom.  He gets out of bed at 530 am.  He feels tired in the morning.  He denies morning headache.  He does not use anything to help him fall sleep or stay awake.  He denies sleep walking, sleep talking, bruxism, or nightmares.  There is no history of restless legs.  He denies sleep hallucinations, sleep paralysis, or cataplexy.  The Epworth score is 1 out of 24.  Physical Exam:  General - pleasant Eyes - pupils reactive ENT - no sinus tenderness, no oral exudate, no LAN, MP 4 Cardiac - regular, no murmur Chest - no wheeze, rales Abd - soft, non tender Ext - no edema Skin - no rashes Neuro - normal strength Psych - normal mood  Discussion: He has snoring, sleep disruption, witnessed apnea and daytime sleepiness.  He has history of refractory hypertension.  He is a Proofreader.  I am concerned he could have sleep apnea.  We discussed how sleep apnea can affect various health problems, including risks for hypertension, cardiovascular disease, and diabetes.  We also discussed how sleep disruption can increase risks for accidents, such as while driving.  Weight loss as a means of improving sleep apnea was also  reviewed.  Additional treatment options discussed were CPAP therapy, oral appliance, and surgical intervention.  Assessment/Plan:  Suspected sleep apnea. - will arrange for home sleep study   Patient Instructions  Will arrange for home sleep study Will call to arrange for follow up after sleep study reviewed    Chesley Mires, MD [place] 04/28/2017, 3:41 PM Pager:  [phone]  Flow Sheet  Sleep tests:  Review of Systems: Constitutional: Negative for fever and unexpected weight change.  HENT: Positive for sinus pressure and sneezing. Negative for congestion, dental problem, ear pain, nosebleeds, postnasal drip, rhinorrhea, sore throat and trouble swallowing.   Eyes: Negative for redness and itching.  Respiratory: Negative for cough, chest tightness, shortness of breath and wheezing.   Cardiovascular: Negative for palpitations and leg swelling.  Gastrointestinal: Negative for nausea and vomiting.  Genitourinary: Negative for dysuria.  Musculoskeletal: Negative for joint swelling.  Skin: Negative for rash.  Allergic/Immunologic: Positive for environmental allergies. Negative for food allergies and immunocompromised state.  Neurological: Negative for headaches.  Hematological: Does not bruise/bleed easily.  Psychiatric/Behavioral: Negative for dysphoric mood. The patient is nervous/anxious.    Past Medical History: He  has a past medical history of Cardiomyopathy, Chicken pox, Gout, Hyperglycemia, Hyperlipidemia, Hypertension, Hypogonadism in male, and Toxic effect of venom of bees.  Past Surgical History: He  has a past surgical history that includes Hand surgery.  Family History: His family history includes Coronary artery disease in his mother; Diabetes in his maternal grandmother and mother; Healthy in his brother, sister, and son; Heart disease in his father; Hyperlipidemia in his mother; Hypertension in his father and mother; Kidney failure in his  maternal grandmother.  Social History: He  reports that  has never smoked. he has never used smokeless tobacco. He reports that he drinks alcohol. He reports that he does not use drugs.  Medications: Allergies as of 04/28/2017      Reactions   Bee Venom Swelling      Medication List        Accurate as of 04/28/17  3:41 PM. Always use your most recent med list.          amLODipine 10 MG tablet Commonly known as:  NORVASC Take 1 tablet (10 mg total) by mouth daily.   lisinopril 20 MG tablet Commonly known as:  PRINIVIL,ZESTRIL Take 1 tablet (20 mg total) by mouth daily.   rosuvastatin 20 MG tablet Commonly known as:  CRESTOR Take 1 tablet (20 mg total) by mouth 2 (two) times daily.

## 2017-04-28 NOTE — Patient Instructions (Signed)
Will arrange for home sleep study Will call to arrange for follow up after sleep study reviewed  

## 2017-05-18 DIAGNOSIS — G4733 Obstructive sleep apnea (adult) (pediatric): Secondary | ICD-10-CM | POA: Diagnosis not present

## 2017-05-19 ENCOUNTER — Telehealth: Payer: Self-pay | Admitting: Pulmonary Disease

## 2017-05-19 ENCOUNTER — Other Ambulatory Visit: Payer: Self-pay | Admitting: *Deleted

## 2017-05-19 ENCOUNTER — Encounter: Payer: Self-pay | Admitting: Pulmonary Disease

## 2017-05-19 DIAGNOSIS — G4733 Obstructive sleep apnea (adult) (pediatric): Secondary | ICD-10-CM | POA: Diagnosis not present

## 2017-05-19 DIAGNOSIS — R29818 Other symptoms and signs involving the nervous system: Secondary | ICD-10-CM

## 2017-05-19 HISTORY — DX: Obstructive sleep apnea (adult) (pediatric): G47.33

## 2017-05-19 NOTE — Telephone Encounter (Signed)
HST 05/18/17 >> AHI 16.6, SaO2 low 68%   Will have my nurse inform pt that sleep study shows moderate sleep apnea.  Options are 1) CPAP now, 2) ROV first.  If He is agreeable to CPAP, then please send order for auto CPAP range 5 to 15 cm H2O with heated humidity and mask of choice.  Have download sent 1 month after starting CPAP and set up ROV 2 months after starting CPAP.  ROV can be with me or NP.

## 2017-05-21 NOTE — Telephone Encounter (Signed)
Attempted to call pt but no answer.   Left message for pt to return our call x1 

## 2017-05-21 NOTE — Telephone Encounter (Signed)
Pt returned my call. I went over the results of HST with him giving him the two options.  Per pt, he would like to come in and discuss HST in more detail first.  OV scheduled for pt 06/06/17 with TP.  Nothing further needed at this current time.

## 2017-05-22 ENCOUNTER — Encounter: Payer: Self-pay | Admitting: Family Medicine

## 2017-05-22 ENCOUNTER — Ambulatory Visit (INDEPENDENT_AMBULATORY_CARE_PROVIDER_SITE_OTHER): Payer: Managed Care, Other (non HMO) | Admitting: Family Medicine

## 2017-05-22 VITALS — BP 134/92 | HR 117 | Temp 99.7°F | Ht 69.0 in | Wt 222.5 lb

## 2017-05-22 DIAGNOSIS — K529 Noninfective gastroenteritis and colitis, unspecified: Secondary | ICD-10-CM

## 2017-05-22 MED ORDER — ONDANSETRON HCL 4 MG PO TABS: 4.0000 mg | ORAL_TABLET | Freq: Three times a day (TID) | ORAL | 0 refills | Status: DC | PRN

## 2017-05-22 MED FILL — ONDANSETRON HCL 4 MG TABLET: 4 | 7 days supply | Qty: 20 | Fill #0

## 2017-05-22 NOTE — Progress Notes (Signed)
Pre visit review using our clinic review tool, if applicable. No additional management support is needed unless otherwise documented below in the visit note. 

## 2017-05-22 NOTE — Patient Instructions (Signed)
Viral Gastroenteritis, Adult  Viral gastroenteritis is also known as the stomach flu. This condition is caused by various viruses. These viruses can be passed from person to person very easily (are very contagious). This condition may affect your stomach, small intestine, and large intestine. It can cause sudden watery diarrhea, fever, and vomiting.  Diarrhea and vomiting can make you feel weak and cause you to become dehydrated. You may not be able to keep fluids down. Dehydration can make you tired and thirsty, cause you to have a dry mouth, and decrease how often you urinate. Older adults and people with other diseases or a weak immune system are at higher risk for dehydration.  It is important to replace the fluids that you lose from diarrhea and vomiting. If you become severely dehydrated, you may need to get fluids through an IV tube.  What are the causes?  Gastroenteritis is caused by various viruses, including rotavirus and norovirus. Norovirus is the most common cause in adults.  You can get sick by eating food, drinking water, or touching a surface contaminated with one of these viruses. You can also get sick from sharing utensils or other personal items with an infected person.  What increases the risk?  This condition is more likely to develop in people:  · Who have a weak defense system (immune system).  · Who live with one or more children who are younger than 2 years old.  · Who live in a nursing home.  · Who go on cruise ships.    What are the signs or symptoms?  Symptoms of this condition start suddenly 1-2 days after exposure to a virus. Symptoms may last a few days or as long as a week. The most common symptoms are watery diarrhea and vomiting. Other symptoms include:  · Fever.  · Headache.  · Fatigue.  · Pain in the abdomen.  · Chills.  · Weakness.  · Nausea.  · Muscle aches.  · Loss of appetite.    How is this diagnosed?  This condition is diagnosed with a medical history and physical exam. You  may also have a stool test to check for viruses or other infections.  How is this treated?  This condition typically goes away on its own. The focus of treatment is to restore lost fluids (rehydration). Your health care provider may recommend that you take an oral rehydration solution (ORS) to replace important salts and minerals (electrolytes) in your body. Severe cases of this condition may require giving fluids through an IV tube.  Treatment may also include medicine to help with your symptoms.  Follow these instructions at home:  Follow instructions from your health care provider about how to care for yourself at home.  Eating and drinking  Follow these recommendations as told by your health care provider:  · Take an ORS. This is a drink that is sold at pharmacies and retail stores.  · Drink clear fluids in small amounts as you are able. Clear fluids include water, ice chips, diluted fruit juice, and low-calorie sports drinks.  · Eat bland, easy-to-digest foods in small amounts as you are able. These foods include bananas, applesauce, rice, lean meats, toast, and crackers.  · Avoid fluids that contain a lot of sugar or caffeine, such as energy drinks, sports drinks, and soda.  · Avoid alcohol.  · Avoid spicy or fatty foods.    General instructions    · Drink enough fluid to keep your urine clear or   pale yellow.  · Wash your hands often. If soap and water are not available, use hand sanitizer.  · Make sure that all people in your household wash their hands well and often.  · Take over-the-counter and prescription medicines only as told by your health care provider.  · Rest at home while you recover.  · Watch your condition for any changes.  · Take a warm bath to relieve any burning or pain from frequent diarrhea episodes.  · Keep all follow-up visits as told by your health care provider. This is important.  Contact a health care provider if:  · You cannot keep fluids down.  · Your symptoms get worse.  · You have  new symptoms.  · You feel light-headed or dizzy.  · You have muscle cramps.  Get help right away if:  · You have chest pain.  · You feel extremely weak or you faint.  · You see blood in your vomit.  · Your vomit looks like coffee grounds.  · You have bloody or black stools or stools that look like tar.  · You have a severe headache, a stiff neck, or both.  · You have a rash.  · You have severe pain, cramping, or bloating in your abdomen.  · You have trouble breathing or you are breathing very quickly.  · Your heart is beating very quickly.  · Your skin feels cold and clammy.  · You feel confused.  · You have pain when you urinate.  · You have signs of dehydration, such as:  ? Dark urine, very little urine, or no urine.  ? Cracked lips.  ? Dry mouth.  ? Sunken eyes.  ? Sleepiness.  ? Weakness.  This information is not intended to replace advice given to you by your health care provider. Make sure you discuss any questions you have with your health care provider.  Document Released: 02/18/2005 Document Revised: 08/02/2015 Document Reviewed: 10/25/2014  Elsevier Interactive Patient Education © 2018 [employer]

## 2017-05-22 NOTE — Progress Notes (Signed)
Chief Complaint  Patient presents with  . Diarrhea    24 hours  . Cough    2 weeks     Subjective Bruce Harris is a 47 y.o. male who presents with vomiting and diarrhea Symptoms began 2 d ago. Patient has vomiting, diarrhea, fever, nausea and myalgias Patient denies abdominal pain, cramping and bleeding Evaluation to date: ibuprofen, Tylenol, Immodium Sick contacts: none known  Past Medical History:  Diagnosis Date  . Cardiomyopathy   . Chicken pox   . Gout   . Hyperglycemia   . Hyperlipidemia   . Hypertension   . Hypogonadism in male   . OSA (obstructive sleep apnea) 05/19/2017  . Toxic effect of venom of bees   Review of Systems Constitutional:  No fevers or chills Ear/Nose/Mouth/Throat:  No red eyes Gastrointestinal:  As noted in the HPI Musculoskeletal/Extremities: no myalgias Integumentary (Skin/Breast): no rash  Exam BP (!) 134/92 (BP Location: Left Arm, Patient Position: Sitting, Cuff Size: Large)   Pulse (!) 117   Temp 99.7 F (37.6 C) (Oral)   Ht 5\' 9"  (1.753 m)   Wt 222 lb 8 oz (100.9 kg)   SpO2 95%   BMI 32.86 kg/m  General:  well developed, well hydrated, in no apparent distress Skin:  warm, no pallor or diaphoresis, no rashes Throat/Pharynx:  lips and gingiva without lesion; tongue and uvula midline; non-inflamed pharynx; no exudates or postnasal drainage Neck: neck supple without adenopathy, thyromegaly, or masses Lungs:  clear to auscultation, breath sounds equal bilaterally, no respiratory distress, no wheezes Cardio:  regular rate and rhythm without murmurs Abdomen:  abdomen soft, nontender; bowel sounds normal; no masses or organomegaly Extremities:  no clubbing, cyanosis, or edema Psych: Appropriate judgement/insight  Assessment and Plan  Gastroenteritis - Plan: ondansetron (ZOFRAN) 4 MG tablet  Orders as above.  Supportive care. Monitor blood pressure at home.  If consistently elevated, will need to return to clinic to tailor blood  pressure regimen. F/u if symptoms fail to improve, sooner if worsening. The patient voiced understanding and agreement to the plan.  [place], DO 05/22/17  3:20 PM

## 2017-06-06 ENCOUNTER — Ambulatory Visit: Payer: Self-pay | Admitting: Adult Health

## 2017-06-11 ENCOUNTER — Encounter: Payer: Self-pay | Admitting: Adult Health

## 2017-06-11 ENCOUNTER — Ambulatory Visit (INDEPENDENT_AMBULATORY_CARE_PROVIDER_SITE_OTHER): Payer: Managed Care, Other (non HMO) | Admitting: Adult Health

## 2017-06-11 DIAGNOSIS — G4733 Obstructive sleep apnea (adult) (pediatric): Secondary | ICD-10-CM | POA: Diagnosis not present

## 2017-06-11 DIAGNOSIS — I10 Essential (primary) hypertension: Secondary | ICD-10-CM

## 2017-06-11 NOTE — Progress Notes (Signed)
Reviewed and agree with assessment/plan.   Dymir Neeson, MD [facility] 02/28/2016, 12:24 PM Pager:  [phone]  

## 2017-06-11 NOTE — Progress Notes (Signed)
@Patient  ID: Bruce Harris, male    DOB: 06/09/1970, 47 y.o.   MRN: [id number]  Chief Complaint  Patient presents with  . Follow-up    OSA     Referring provider: Shelda Pal*  HPI: 47 year old male seen for sleep consult April 28, 2017 for snoring and daytime sleepiness.  Patient was set up for a home sleep study and found to have moderate sleep apnea  TEST  HST 05/18/17 >> AHI 16.6, SaO2 low 68%   06/11/2017 Follow up: OSA  Patient returns for a follow-up.  Patient was seen in February for a sleep consult for snoring and daytime sleepiness.  He was set up for a home sleep study done May 18, 2017 that showed moderate sleep apnea with an AHI at 16/hour/SaO2 low at 68%. We discussed his test results with pt education on OSA .  Went over treatment options including weight loss oral appliance and CPAP.  Patient would like to proceed with a CPAP at bedtime.    Allergies  Allergen Reactions  . Bee Venom Swelling    Immunization History  Administered Date(s) Administered  . Influenza Whole 12/30/2005, 12/10/2007  . Td 09/10/2004    Past Medical History:  Diagnosis Date  . Cardiomyopathy   . Chicken pox   . Gout   . Hyperglycemia   . Hyperlipidemia   . Hypertension   . Hypogonadism in male   . OSA (obstructive sleep apnea) 05/19/2017  . Toxic effect of venom of bees     Tobacco History: Social History   Tobacco Use  Smoking Status Never Smoker  Smokeless Tobacco Never Used   Counseling given: Not Answered   Outpatient Encounter Medications as of 06/11/2017  Medication Sig  . amLODipine (NORVASC) 10 MG tablet Take 1 tablet (10 mg total) by mouth daily.  Marland Kitchen lisinopril (PRINIVIL,ZESTRIL) 20 MG tablet Take 1 tablet (20 mg total) by mouth daily. (Patient taking differently: Take 10 mg by mouth daily. )  . ondansetron (ZOFRAN) 4 MG tablet Take 1 tablet (4 mg total) by mouth every 8 (eight) hours as needed for nausea or vomiting.  . rosuvastatin  (CRESTOR) 20 MG tablet Take 1 tablet (20 mg total) by mouth 2 (two) times daily.   No facility-administered encounter medications on file as of 06/11/2017.      Review of Systems  Constitutional:   No  weight loss, night sweats,  Fevers, chills,  +fatigue, or  lassitude.  HEENT:   No headaches,  Difficulty swallowing,  Tooth/dental problems, or  Sore throat,                No sneezing, itching, ear ache, nasal congestion, post nasal drip,   CV:  No chest pain,  Orthopnea, PND, swelling in lower extremities, anasarca, dizziness, palpitations, syncope.   GI  No heartburn, indigestion, abdominal pain, nausea, vomiting, diarrhea, change in bowel habits, loss of appetite, bloody stools.   Resp: No shortness of breath with exertion or at rest.  No excess mucus, no productive cough,  No non-productive cough,  No coughing up of blood.  No change in color of mucus.  No wheezing.  No chest wall deformity  Skin: no rash or lesions.  GU: no dysuria, change in color of urine, no urgency or frequency.  No flank pain, no hematuria   MS:  No joint pain or swelling.  No decreased range of motion.  No back pain.    Physical Exam  Pulse 87  Ht 5' 8.5" (1.74 m)   Wt 237 lb (107.5 kg)   SpO2 98%   BMI 35.51 kg/m   GEN: A/Ox3; pleasant , NAD, obese    HEENT:  [place]/AT,  EACs-clear, TMs-wnl, NOSE-clear, THROAT-clear, no lesions, no postnasal drip or exudate noted. Class 2-3 MP airway   NECK:  Supple w/ fair ROM; no JVD; normal carotid impulses w/o bruits; no thyromegaly or nodules palpated; no lymphadenopathy.    RESP  Clear  P & A; w/o, wheezes/ rales/ or rhonchi. no accessory muscle use, no dullness to percussion  CARD:  RRR, no m/r/g, no peripheral edema, pulses intact, no cyanosis or clubbing.  GI:   Soft & nt; nml bowel sounds; no organomegaly or masses detected.   Musco: Warm bil, no deformities or joint swelling noted.   Neuro: alert, no focal deficits noted.    Skin: Warm, no  lesions or rashes    Lab Results:  CBC    Component Value Date/Time   WBC 5.0 10/18/2016 1010   RBC 5.33 10/18/2016 1010   HGB 14.1 10/18/2016 1010   HCT 44.2 10/18/2016 1010   PLT 262.0 10/18/2016 1010   MCV 82.8 10/18/2016 1010   MCH 26.2 10/13/2013 0948   MCHC 31.8 10/18/2016 1010   RDW 14.8 10/18/2016 1010   LYMPHSABS 2.0 10/18/2016 1010   MONOABS 0.4 10/18/2016 1010   EOSABS 0.2 10/18/2016 1010   BASOSABS 0.0 10/18/2016 1010    BMET    Component Value Date/Time   NA 141 10/18/2016 1010   K 4.2 10/18/2016 1010   CL 106 10/18/2016 1010   CO2 29 10/18/2016 1010   GLUCOSE 92 10/18/2016 1010   BUN 14 10/18/2016 1010   CREATININE 1.40 10/18/2016 1010   CREATININE 1.32 10/13/2013 0948   CALCIUM 9.5 10/18/2016 1010   GFRNONAA 66 10/13/2013 0948   GFRAA 76 10/13/2013 0948    BNP No results found for: BNP  ProBNP No results found for: PROBNP  Imaging: No results found.   Assessment & Plan:   OSA (obstructive sleep apnea) Moderate sleep apnea, discussed dx and tx options   Plan  Patient Instructions  Your blood pressure is elevated , need to follow up with Primary MD for Hypertension management .  Begin CPAP at bedtime Goal is to wear CPAP for at least 4-6 hours each night Work on healthy weight Do not drive a sleepy Follow-up in 2 months with Dr. Halford Chessman  Or Woody Kronberg NP and As needed      Essential hypertension bp elevated , advised pt on med compliance (skipped last 2 days ) , and needs follow up with PCP for control    Morbid obesity ([place]) Wt loss      Rexene Edison, NP 06/11/2017

## 2017-06-11 NOTE — Assessment & Plan Note (Signed)
Moderate sleep apnea, discussed dx and tx options   Plan  Patient Instructions  Your blood pressure is elevated , need to follow up with Primary MD for Hypertension management .  Begin CPAP at bedtime Goal is to wear CPAP for at least 4-6 hours each night Work on healthy weight Do not drive a sleepy Follow-up in 2 months with Dr. Halford Chessman  Or Karaline Buresh NP and As needed

## 2017-06-11 NOTE — Progress Notes (Signed)
Reviewed and agree with assessment/plan.   Larnce Schnackenberg, MD [facility] 02/28/2016, 12:24 PM Pager:  [phone]  

## 2017-06-11 NOTE — Assessment & Plan Note (Signed)
bp elevated , advised pt on med compliance (skipped last 2 days ) , and needs follow up with PCP for control

## 2017-06-11 NOTE — Assessment & Plan Note (Signed)
Wt loss  

## 2017-06-11 NOTE — Patient Instructions (Signed)
Your blood pressure is elevated , need to follow up with Primary MD for Hypertension management .  Begin CPAP at bedtime Goal is to wear CPAP for at least 4-6 hours each night Work on healthy weight Do not drive a sleepy Follow-up in 2 months with Dr. Halford Chessman  Or [place] NP and As needed

## 2017-06-11 NOTE — Addendum Note (Signed)
Addended by: Parke Poisson E on: 06/11/2017 09:31 AM   Modules accepted: Orders

## 2017-06-12 ENCOUNTER — Telehealth: Payer: Self-pay | Admitting: Adult Health

## 2017-06-12 NOTE — Telephone Encounter (Signed)
Called and spoke to pt. Pt is requesting to have the letter written by TP on 4.10.19 be faxed to his place of work. This is has been done. Fax number is in the 'visit info'. Pt is aware. Nothing further needed.

## 2017-07-04 ENCOUNTER — Ambulatory Visit: Payer: Managed Care, Other (non HMO) | Admitting: Family Medicine

## 2017-07-04 ENCOUNTER — Encounter: Payer: Self-pay | Admitting: Family Medicine

## 2017-07-04 VITALS — BP 124/88 | HR 84 | Temp 98.4°F | Resp 16 | Ht 68.5 in | Wt 223.0 lb

## 2017-07-04 DIAGNOSIS — I10 Essential (primary) hypertension: Secondary | ICD-10-CM

## 2017-07-04 DIAGNOSIS — E291 Testicular hypofunction: Secondary | ICD-10-CM | POA: Diagnosis not present

## 2017-07-04 NOTE — Patient Instructions (Signed)
Keep up the good work.   Let me know if your blood pressure readings get lower when you start the CPAP and we can come down on your medicine.  Let us know if you need anything.

## 2017-07-04 NOTE — Progress Notes (Signed)
Chief Complaint  Patient presents with  . Hypertension    3 month follow up, no chnages or concerns    Subjective Bruce Harris is a 47 y.o. male who presents for hypertension follow up. He does monitor home blood pressures. Blood pressures ranging from 120-130's/80's on average. He is compliant with medication- lisinopril 20 mg/d, Norvasc 10 mg/d. Patient has these side effects of medication: none He is adhering to a healthy diet overall. Current exercise: walking, stair climber   Hx of Low T, would like to follow up with that. Does have OSA, is picking up cpap today.   Past Medical History:  Diagnosis Date  . Cardiomyopathy   . Chicken pox   . Gout   . Hyperglycemia   . Hyperlipidemia   . Hypertension   . Hypogonadism in male   . OSA (obstructive sleep apnea) 05/19/2017  . Toxic effect of venom of bees     Review of Systems Cardiovascular: no chest pain Respiratory:  no shortness of breath  Exam BP 124/88 (BP Location: Left Arm, Patient Position: Sitting, Cuff Size: Large)   Pulse 84   Temp 98.4 F (36.9 C) (Oral)   Resp 16   Ht 5' 8.5" (1.74 m)   Wt 223 lb (101.2 kg)   SpO2 98%   BMI 33.41 kg/m  General:  well developed, well nourished, in no apparent distress Heart: RRR, no bruits, no LE edema Lungs: clear to auscultation, no accessory muscle use Psych: well oriented with normal range of affect and appropriate judgment/insight  Essential hypertension  Hypogonadism in male - Plan: Testosterone Total,Free,Bio, Males  Cont Norvasc and ACEi. If blood pressure starts to drop due to sleep apnea being treated, he will let us know and we will start weaning down his medicine. Counseled on diet and exercise.  He is doing much better. F/u in 6 mo for CPE pending above. The patient voiced understanding and agreement to the plan.  [place], DO 07/04/17  8:46 AM

## 2017-07-07 LAB — TESTOSTERONE TOTAL,FREE,BIO, MALES
ALBUMIN MSPROF: 4.2 g/dL (ref 3.6–5.1)
SEX HORMONE BINDING: 21 nmol/L (ref 10–50)
TESTOSTERONE: 325 ng/dL (ref 250–827)
Testosterone, Bioavailable: 115.9 ng/dL (ref >=110.0)
Testosterone, Free: 60.2 pg/mL (ref 46.0–224.0)

## 2017-08-11 ENCOUNTER — Ambulatory Visit (INDEPENDENT_AMBULATORY_CARE_PROVIDER_SITE_OTHER): Payer: Managed Care, Other (non HMO) | Admitting: Pulmonary Disease

## 2017-08-11 ENCOUNTER — Encounter: Payer: Self-pay | Admitting: Pulmonary Disease

## 2017-08-11 VITALS — BP 148/112 | HR 85 | Ht 68.0 in | Wt 224.2 lb

## 2017-08-11 DIAGNOSIS — G4733 Obstructive sleep apnea (adult) (pediatric): Secondary | ICD-10-CM | POA: Diagnosis not present

## 2017-08-11 NOTE — Progress Notes (Signed)
[facility]  Chief Complaint  Patient presents with  . Follow-up    Pt doing well overall with new cpap machine.    Vital signs: BP (!) 148/112 (BP Location: Left Arm, Cuff Size: Normal)   Pulse 85   Ht 5\' 8"  (1.727 m)   Wt 224 lb 3.2 oz (101.7 kg)   SpO2 94%   BMI 34.09 kg/m   History of Present Illness: Bruce Harris is a 47 y.o. male with obstructive sleep apnea.  Since his last visit with me he had home sleep study.  Moderate sleep apnea.  Started on CPAP.  Sleeping better, no snoring, more alert.  Uses nasal mask.  Fits well.  Was told by DME he only needed 4 hrs per night.  Physical Exam:  General - pleasant Eyes - pupils reactive ENT - no sinus tenderness, no oral exudate, no LAN, MP 4 Cardiac - regular, no murmur Chest - no wheeze, rales Abd - soft, non tender Ext - no edema Skin - no rashes Neuro - normal strength Psych - normal mood   Assessment/Plan:  Obstructive sleep apnea. - he is compliant with CPAP and reports benefit - continue auto CPAP - advised to use for entire time he is asleep to get maximal benefit   Patient Instructions  Follow up in 1 year   Chesley Mires, MD [place] 08/11/2017, 4:45 PM Pager:  [phone]  Flow Sheet  Sleep tests: HST 05/18/17 >> AHI 16.6, SaO2 low 68% Auto CPAP 07/11/17 to 08/09/17 >> used on 26 of 30 nights with average 4 hrs 5 min.  Average AHI 1.7 with median CPAP 8 and 95 th percentile CPAP 11 cm H2O  Past Medical History: He  has a past medical history of Cardiomyopathy, Chicken pox, Gout, Hyperglycemia, Hyperlipidemia, Hypertension, Hypogonadism in male, OSA (obstructive sleep apnea) (05/19/2017), and Toxic effect of venom of bees.  Past Surgical History: He  has a past surgical history that includes Hand surgery.  Family History: His family history includes Coronary artery disease in his mother; Diabetes in his maternal grandmother and  mother; Healthy in his brother, sister, and son; Heart disease in his father; Hyperlipidemia in his mother; Hypertension in his father and mother; Kidney failure in his maternal grandmother.  Social History: He  reports that he has never smoked. He has never used smokeless tobacco. He reports that he drinks alcohol. He reports that he does not use drugs.  Medications: Allergies as of 08/11/2017      Reactions   Bee Venom Swelling      Medication List        Accurate as of 08/11/17  4:45 PM. Always use your most recent med list.          amLODipine 10 MG tablet Commonly known as:  NORVASC Take 1 tablet (10 mg total) by mouth daily.   lisinopril 20 MG tablet Commonly known as:  PRINIVIL,ZESTRIL Take 1 tablet (20 mg total) by mouth daily.   rosuvastatin 20 MG tablet Commonly known as:  CRESTOR Take 1 tablet (20 mg total) by mouth 2 (two) times daily.

## 2017-08-11 NOTE — Patient Instructions (Signed)
Follow up in 1 year.

## 2017-08-12 MED FILL — AMLODIPINE BESYLATE 10 MG T: 10 | 90 days supply | Qty: 90 | Fill #0

## 2017-09-29 ENCOUNTER — Encounter: Payer: Self-pay | Admitting: Pulmonary Disease

## 2017-11-04 ENCOUNTER — Telehealth: Payer: Self-pay | Admitting: Family Medicine

## 2017-11-04 DIAGNOSIS — I10 Essential (primary) hypertension: Secondary | ICD-10-CM

## 2017-11-04 MED ORDER — LISINOPRIL 20 MG PO TABS: 20.0000 mg | ORAL_TABLET | Freq: Every day | ORAL | 1 refills | Status: DC

## 2017-11-04 MED ORDER — AMLODIPINE BESYLATE 10 MG PO TABS: 10.0000 mg | ORAL_TABLET | Freq: Every day | ORAL | 1 refills | Status: DC

## 2017-11-04 NOTE — Addendum Note (Signed)
Addended by: Dimple Nanas on: 11/04/2017 06:29 PM   Modules accepted: Orders

## 2017-11-04 NOTE — Telephone Encounter (Signed)
Pt calling because only one of the two medications he requested was sent to pharmacy. Pt still needs refill on lisinopril (PRINIVIL,ZESTRIL) 20 MG tablet

## 2017-11-04 NOTE — Telephone Encounter (Signed)
Copied from [place] [id number]. Topic: Quick Communication - Rx Refill/Question >> Nov 04, 2017  9:38 AM Celedonio Savage L wrote: Medication: amLODipine (NORVASC) 10 MG tablet lisinopril (PRINIVIL,ZESTRIL) 20 MG tablet    Has the patient contacted their pharmacy? Yes.   (Agent: If no, request that the patient contact the pharmacy for the refill.) (Agent: If yes, when and what did the pharmacy advise?) call pt provider something was wrong with pt insurance to call back to provider office  Preferred Pharmacy (with phone number or street name): [place], [place] - [place] [phone] (Phone) [phone] (Fax)    Agent: Please be advised that RX refills may take up to 3 business days. We ask that you follow-up with your pharmacy.

## 2017-11-04 NOTE — Telephone Encounter (Signed)
norvasc 20mg  refill Last Refill:07/04/17 # 90 Last OV: 07/04/17 PCP: Nani Ravens Pharmacy:[facility]

## 2017-11-07 ENCOUNTER — Encounter: Payer: Managed Care, Other (non HMO) | Admitting: Family Medicine

## 2017-11-14 MED FILL — AMLODIPINE BESYLATE 10 MG T: 10 | 90 days supply | Qty: 90 | Fill #0

## 2017-11-14 MED FILL — LISINOPRIL 20 MG TABLET: 20 | 90 days supply | Qty: 90 | Fill #0

## 2017-12-12 ENCOUNTER — Encounter: Payer: Self-pay | Admitting: Family Medicine

## 2017-12-12 ENCOUNTER — Ambulatory Visit (INDEPENDENT_AMBULATORY_CARE_PROVIDER_SITE_OTHER): Payer: Self-pay | Admitting: Family Medicine

## 2017-12-12 VITALS — BP 130/84 | HR 86 | Temp 98.8°F | Ht 68.0 in | Wt 226.1 lb

## 2017-12-12 DIAGNOSIS — Z0000 Encounter for general adult medical examination without abnormal findings: Secondary | ICD-10-CM

## 2017-12-12 DIAGNOSIS — Z23 Encounter for immunization: Secondary | ICD-10-CM

## 2017-12-12 DIAGNOSIS — E785 Hyperlipidemia, unspecified: Secondary | ICD-10-CM

## 2017-12-12 DIAGNOSIS — I10 Essential (primary) hypertension: Secondary | ICD-10-CM

## 2017-12-12 MED ORDER — ROSUVASTATIN CALCIUM 20 MG PO TABS: 20.0000 mg | ORAL_TABLET | Freq: Every day | ORAL | 3 refills | Status: DC

## 2017-12-12 MED ORDER — ROSUVASTATIN CALCIUM 20 MG PO TABS: 20.0000 mg | ORAL_TABLET | Freq: Two times a day (BID) | ORAL | 3 refills | Status: DC

## 2017-12-12 NOTE — Addendum Note (Signed)
Addended by: Sharon Seller B on: 12/12/2017 01:43 PM   Modules accepted: Orders

## 2017-12-12 NOTE — Progress Notes (Signed)
Chief Complaint  Patient presents with  . Annual Exam    Well Male Bruce Harris is here for a complete physical.   His last physical was >1 year ago.  Current diet: in general, an "OK" diet.   Current exercise: physically active at work Weight trend: stable Daytime fatigue? No. Seat belt? Yes.    Health maintenance Tetanus- No HIV- Yes  Past Medical History:  Diagnosis Date  . Cardiomyopathy   . Chicken pox   . Gout   . Hyperglycemia   . Hyperlipidemia   . Hypertension   . Hypogonadism in male   . OSA (obstructive sleep apnea) 05/19/2017  . Toxic effect of venom of bees      Past Surgical History:  Procedure Laterality Date  . HAND SURGERY     Right    Medications  Current Outpatient Medications on File Prior to Visit  Medication Sig Dispense Refill  . amLODipine (NORVASC) 10 MG tablet Take 1 tablet (10 mg total) by mouth daily. 90 tablet 1  . lisinopril (PRINIVIL,ZESTRIL) 20 MG tablet Take 1 tablet (20 mg total) by mouth daily. 90 tablet 1  . rosuvastatin (CRESTOR) 20 MG tablet Take 1 tablet (20 mg total) by mouth 2 (two) times daily. 90 tablet 1   Allergies Allergies  Allergen Reactions  . Bee Venom Swelling    Family History Family History  Problem Relation Age of Onset  . Coronary artery disease Mother        Living  . Diabetes Mother   . Hyperlipidemia Mother   . Hypertension Mother   . Hypertension Father        Living  . Heart disease Father   . Diabetes Maternal Grandmother   . Kidney failure Maternal Grandmother   . Healthy Brother        x1  . Healthy Sister        x2  . Healthy Son        x2    Review of Systems: Constitutional: no fevers or chills Eye:  no recent significant change in vision Ear/Nose/Mouth/Throat:  Ears:  no tinnitus or hearing loss Nose/Mouth/Throat:  no complaints of nasal congestion, no sore throat Cardiovascular:  no chest pain, no palpitations Respiratory:  no cough and no shortness of  breath Gastrointestinal:  no abdominal pain, no change in bowel habits GU:  Male: negative for dysuria, frequency, and incontinence and negative for prostate symptoms Musculoskeletal/Extremities:  no pain, redness, or swelling of the joints Integumentary (Skin/Breast):  no abnormal skin lesions reported Neurologic:  no headaches, no numbness, tingling Endocrine: No unexpected weight changes Hematologic/Lymphatic:  no night sweats  Exam BP 130/84 (BP Location: Left Arm, Patient Position: Sitting, Cuff Size: Normal)   Pulse 86   Temp 98.8 F (37.1 C) (Oral)   Ht 5\' 8"  (1.727 m)   Wt 226 lb 2 oz (102.6 kg)   SpO2 98%   BMI 34.38 kg/m  General:  well developed, well nourished, in no apparent distress Skin:  no significant moles, warts, or growths Head:  no masses, lesions, or tenderness Eyes:  pupils equal and round, sclera anicteric without injection Ears:  canals without lesions, TMs shiny without retraction, no obvious effusion, no erythema Nose:  nares patent, septum midline, mucosa normal Throat/Pharynx:  lips and gingiva without lesion; tongue and uvula midline; non-inflamed pharynx; no exudates or postnasal drainage Neck: neck supple without adenopathy, thyromegaly, or masses Lungs:  clear to auscultation, breath sounds equal bilaterally, no respiratory  distress Cardio:  regular rate and rhythm, no bruits, no LE edema Abdomen:  abdomen soft, nontender; bowel sounds normal; no masses or organomegaly Genital (male): circumcised penis, no lesions or discharge; testes present bilaterally without masses or tenderness Rectal: Deferred Musculoskeletal:  symmetrical muscle groups noted without atrophy or deformity Extremities:  no clubbing, cyanosis, or edema, no deformities, no skin discoloration Neuro:  gait normal; deep tendon reflexes normal and symmetric Psych: well oriented with normal range of affect and appropriate judgment/insight  Assessment and Plan  Well adult exam -  Plan: Comprehensive metabolic panel, Lipid panel, PSA  Hyperlipidemia, unspecified hyperlipidemia type - Plan: rosuvastatin (CRESTOR) 20 MG tablet  Well 47 y.o. male. Counseled on diet and exercise. Refused flu shot today. Other orders as above. Follow up in 1 year pending the above workup. The patient voiced understanding and agreement to the plan.  [place], DO 12/12/17 1:33 PM

## 2017-12-12 NOTE — Addendum Note (Signed)
Addended by: Harl Bowie on: 12/12/2017 02:54 PM   Modules accepted: Orders

## 2017-12-12 NOTE — Progress Notes (Signed)
Pre visit review using our clinic review tool, if applicable. No additional management support is needed unless otherwise documented below in the visit note. 

## 2017-12-12 NOTE — Patient Instructions (Signed)
Give [name] 2-3 business days to get the results of your labs back.   Aim to do some physical exertion for 150 minutes per week. This is typically divided into 5 days per week, 30 minutes per day. The activity should be enough to get your heart rate up. Anything is better than nothing if you have time constraints.  Keep the diet clean.  Let us know if you need anything.

## 2017-12-24 ENCOUNTER — Telehealth: Payer: Self-pay | Admitting: Family Medicine

## 2017-12-24 DIAGNOSIS — I10 Essential (primary) hypertension: Secondary | ICD-10-CM

## 2017-12-24 MED ORDER — AMLODIPINE BESYLATE-VALSARTAN 10-320 MG PO TABS: 1.0000 | ORAL_TABLET | Freq: Every day | ORAL | 5 refills | Status: DC

## 2017-12-24 MED ORDER — LISINOPRIL 20 MG PO TABS: 20.0000 mg | ORAL_TABLET | Freq: Every day | ORAL | 1 refills | Status: DC

## 2017-12-24 MED ORDER — AMLODIPINE BESYLATE 10 MG PO TABS: 10.0000 mg | ORAL_TABLET | Freq: Every day | ORAL | 1 refills | Status: DC

## 2017-12-24 MED FILL — AMLODIPINE-VALSARTAN 10-320: 10-320 | 30 days supply | Qty: 30 | Fill #0

## 2017-12-24 NOTE — Telephone Encounter (Signed)
Will stop those 2 and start Exforge. If too expensive, we can revert to original order, but he should let us know. TY.

## 2017-12-24 NOTE — Telephone Encounter (Signed)
Patient informed of PCP instructions. 

## 2017-12-24 NOTE — Telephone Encounter (Signed)
Sent in both as refills to [facility]---but the patient stated PCP was to change one of these??

## 2017-12-24 NOTE — Telephone Encounter (Signed)
Copied from [place] [id number]. Topic: Quick Communication - Rx Refill/Question >> Dec 24, 2017 10:53 AM Burchel, Abbi R wrote: Medication: amLODipine (NORVASC) 10 MG tablet   lisinopril (PRINIVIL,ZESTRIL) 20 MG tablet   Preferred Pharmacy:   [phone]  Agent: Please be advised that RX refills may take up to 3 business days. We ask that you follow-up with your pharmacy.

## 2018-05-11 MED FILL — AMLODIPINE-VALSARTAN 10-320: 10-320 | 30 days supply | Qty: 30 | Fill #1

## 2018-05-13 ENCOUNTER — Ambulatory Visit (INDEPENDENT_AMBULATORY_CARE_PROVIDER_SITE_OTHER): Payer: BLUE CROSS/BLUE SHIELD | Admitting: Family Medicine

## 2018-05-13 ENCOUNTER — Other Ambulatory Visit: Payer: Self-pay

## 2018-05-13 ENCOUNTER — Encounter: Payer: Self-pay | Admitting: Family Medicine

## 2018-05-13 VITALS — BP 136/82 | HR 81 | Temp 98.5°F | Ht 68.0 in | Wt 226.4 lb

## 2018-05-13 DIAGNOSIS — E785 Hyperlipidemia, unspecified: Secondary | ICD-10-CM

## 2018-05-13 DIAGNOSIS — I10 Essential (primary) hypertension: Secondary | ICD-10-CM | POA: Diagnosis not present

## 2018-05-13 DIAGNOSIS — F524 Premature ejaculation: Secondary | ICD-10-CM

## 2018-05-13 MED ORDER — PAROXETINE HCL 10 MG PO TABS: 10.0000 mg | ORAL_TABLET | Freq: Every day | ORAL | 3 refills | Status: DC

## 2018-05-13 MED FILL — PARoxetine HCL 10 MG TABS: 10 | 30 days supply | Qty: 30 | Fill #0

## 2018-05-13 NOTE — Patient Instructions (Addendum)
Keep the diet clean and stay active.  Aim to do some physical exertion for 150 minutes per week. This is typically divided into 5 days per week, 30 minutes per day. The activity should be enough to get your heart rate up. Anything is better than nothing if you have time constraints.  Come fasting to your next appointment.   Let us know if you need anything.

## 2018-05-13 NOTE — Progress Notes (Signed)
Chief Complaint  Patient presents with  . Follow-up    Subjective Bruce Harris is a 48 y.o. male who presents for hypertension follow up. Here w wife. He does not monitor home blood pressures. He is compliant with medications- Exforge 10-320 mg/d. Patient has these side effects of medication: none He is adhering to a healthy diet overall. Current exercise: none  Hyperlipidemia Patient presents for dyslipidemia follow up. Currently being treated with Crestor 20 mg/d and compliance with treatment thus far has been good. He denies myalgias. He is adhering to a healthy. Exercise:  none The patient is not known to have coexisting coronary artery disease.  1 yr has been having issues w premature ejac. Takes 2-3 min. Not satisfactory for him or spouse. No issues with ED. Sexually active 3x/week on average.    Past Medical History:  Diagnosis Date  . Cardiomyopathy   . Chicken pox   . Gout   . Hyperglycemia   . Hyperlipidemia   . Hypertension   . Hypogonadism in male   . OSA (obstructive sleep apnea) 05/19/2017  . Toxic effect of venom of bees     Review of Systems Cardiovascular: no chest pain Respiratory:  no shortness of breath  Exam BP 136/82 (BP Location: Left Arm, Patient Position: Sitting, Cuff Size: Normal)   Pulse 81   Temp 98.5 F (36.9 C) (Oral)   Ht 5\' 8"  (1.727 m)   Wt 226 lb 6 oz (102.7 kg)   SpO2 93%   BMI 34.42 kg/m  General:  well developed, well nourished, in no apparent distress Heart: RRR, no bruits, no LE edema Lungs: clear to auscultation, no accessory muscle use Psych: well oriented with normal range of affect and appropriate judgment/insight  Essential hypertension  Hyperlipidemia, unspecified hyperlipidemia type  Premature ejaculation - Plan: PARoxetine (PAXIL) 10 MG tablet  Cont Exforge and Crestor. Start Paxil to help with #3. Counseled on diet and exercise. F/u in 2 d for CPE (new ins/job requiring) and 1 mo for med ck. The  patient voiced understanding and agreement to the plan.  [place], DO 05/13/18  2:51 PM

## 2018-05-15 ENCOUNTER — Encounter: Payer: Self-pay | Admitting: Family Medicine

## 2018-06-12 ENCOUNTER — Ambulatory Visit: Payer: Self-pay | Admitting: Family Medicine

## 2018-06-17 ENCOUNTER — Encounter: Payer: Self-pay | Admitting: Family Medicine

## 2018-06-17 ENCOUNTER — Other Ambulatory Visit: Payer: Self-pay

## 2018-06-17 ENCOUNTER — Ambulatory Visit (INDEPENDENT_AMBULATORY_CARE_PROVIDER_SITE_OTHER): Payer: BLUE CROSS/BLUE SHIELD | Admitting: Family Medicine

## 2018-06-17 DIAGNOSIS — F524 Premature ejaculation: Secondary | ICD-10-CM | POA: Diagnosis not present

## 2018-06-17 DIAGNOSIS — I10 Essential (primary) hypertension: Secondary | ICD-10-CM | POA: Diagnosis not present

## 2018-06-17 DIAGNOSIS — J302 Other seasonal allergic rhinitis: Secondary | ICD-10-CM | POA: Insufficient documentation

## 2018-06-17 MED ORDER — LEVOCETIRIZINE DIHYDROCHLORIDE 5 MG PO TABS: 5.0000 mg | ORAL_TABLET | Freq: Every evening | ORAL | 2 refills | Status: DC

## 2018-06-17 MED ORDER — PAROXETINE HCL 20 MG PO TABS: 20.0000 mg | ORAL_TABLET | Freq: Every day | ORAL | 2 refills | Status: DC

## 2018-06-17 MED FILL — PARoxetine HCL 20 MG TABS: 20 | 30 days supply | Qty: 30 | Fill #0

## 2018-06-17 MED FILL — LEVOCETIRIZINE 5 MG TABLET: 5 | 30 days supply | Qty: 30 | Fill #0

## 2018-06-17 NOTE — Progress Notes (Signed)
CC: HTN  Subjective Bruce Harris is a 48 y.o. male who presents for hypertension follow up. Due to outbreak, we are interacting via web portal for an electronic face-to-face visit. I verified patient's ID using 2 identifiers.  He does monitor home blood pressures. Blood pressures ranging from 120's/80's on average. He is compliant with medications- Exforge 10-320 mg/d. Patient has these side effects of medication: none He is adhering to a healthy diet overall. Current exercise: walking   Started on Paxil to help with premature ejaculation. Reports this worked to improve things about 20%. No AE's otherwise.   Hx of allergies. Just picked up Flonase. Does not take an antihistamine.    Past Medical History:  Diagnosis Date  . Cardiomyopathy   . Chicken pox   . Gout   . Hyperglycemia   . Hyperlipidemia   . Hypertension   . Hypogonadism in male   . OSA (obstructive sleep apnea) 05/19/2017  . Toxic effect of venom of bees     Review of Systems Cardiovascular: no chest pain Respiratory:  no shortness of breath  Exam No conversational dyspnea Age appropriate judgment and insight Nml affect and mood  Essential hypertension  Premature ejaculation - Plan: PARoxetine (PAXIL) 20 MG tablet  Seasonal allergies - Plan: levocetirizine (XYZAL) 5 MG tablet  Cont Exforge. Counseled on diet and exercise. Increase dose of Paxil from 10 mg/d to 20 mg/d.  Start PO antihistamine. OTC list provided. Cont INCS. F/u in 4-6 weeks. The patient voiced understanding and agreement to the plan.  [place], DO 06/17/18  4:17 PM

## 2018-06-19 ENCOUNTER — Telehealth: Payer: Self-pay | Admitting: Family Medicine

## 2018-06-19 NOTE — Telephone Encounter (Signed)
LMOVM for pt to call office back to schedule 1 mo f/u visit w/Dr. Nani Ravens on or around Jul 17, 2018.

## 2018-07-31 ENCOUNTER — Encounter: Payer: Self-pay | Admitting: Family Medicine

## 2018-08-03 ENCOUNTER — Other Ambulatory Visit: Payer: Self-pay | Admitting: Family Medicine

## 2018-08-03 DIAGNOSIS — F524 Premature ejaculation: Secondary | ICD-10-CM

## 2018-08-03 NOTE — Progress Notes (Signed)
ambu

## 2018-09-03 DIAGNOSIS — Z125 Encounter for screening for malignant neoplasm of prostate: Secondary | ICD-10-CM | POA: Diagnosis not present

## 2018-09-03 DIAGNOSIS — N5201 Erectile dysfunction due to arterial insufficiency: Secondary | ICD-10-CM | POA: Diagnosis not present

## 2018-09-15 DIAGNOSIS — N5201 Erectile dysfunction due to arterial insufficiency: Secondary | ICD-10-CM | POA: Diagnosis not present

## 2018-09-28 MED FILL — AMLODIPINE-VALSARTAN 10-320: 10-320 | 30 days supply | Qty: 30 | Fill #2

## 2018-09-30 ENCOUNTER — Telehealth: Payer: Self-pay | Admitting: Family Medicine

## 2018-09-30 NOTE — Telephone Encounter (Signed)
done

## 2018-09-30 NOTE — Telephone Encounter (Signed)
Pt came in office today wanting to inform would like to have his pharmacy changed to [facility] on pt's record. Any future rx to be send to [facility].

## 2018-10-20 ENCOUNTER — Ambulatory Visit (INDEPENDENT_AMBULATORY_CARE_PROVIDER_SITE_OTHER): Payer: BC Managed Care – PPO | Admitting: Family Medicine

## 2018-10-20 ENCOUNTER — Other Ambulatory Visit: Payer: Self-pay

## 2018-10-20 ENCOUNTER — Encounter: Payer: Self-pay | Admitting: Family Medicine

## 2018-10-20 ENCOUNTER — Ambulatory Visit: Payer: Self-pay

## 2018-10-20 DIAGNOSIS — K529 Noninfective gastroenteritis and colitis, unspecified: Secondary | ICD-10-CM | POA: Diagnosis not present

## 2018-10-20 DIAGNOSIS — R42 Dizziness and giddiness: Secondary | ICD-10-CM | POA: Diagnosis not present

## 2018-10-20 DIAGNOSIS — Z20822 Contact with and (suspected) exposure to covid-19: Secondary | ICD-10-CM

## 2018-10-20 MED ORDER — DICYCLOMINE HCL 10 MG PO CAPS: 10.0000 mg | ORAL_CAPSULE | Freq: Three times a day (TID) | ORAL | 0 refills | Status: DC

## 2018-10-20 MED ORDER — ONDANSETRON HCL 4 MG PO TABS: 4.0000 mg | ORAL_TABLET | Freq: Three times a day (TID) | ORAL | 0 refills | Status: DC | PRN

## 2018-10-20 MED FILL — ONDANSETRON HCL 4 MG TABLET: 4 | 6 days supply | Qty: 20 | Fill #0

## 2018-10-20 MED FILL — DICYCLOMINE 10 MG CAPSULE: 10 | 7 days supply | Qty: 30 | Fill #0

## 2018-10-20 NOTE — Telephone Encounter (Signed)
Appt scheduled

## 2018-10-20 NOTE — Progress Notes (Signed)
Chief Complaint  Patient presents with  . Fever  . Dizziness  . Nausea  . Headache     Subjective Bruce Harris is a 48 y.o. male who presents with nausea. Due to COVID-19 pandemic, we are interacting via web portal for an electronic face-to-face visit. I verified patient's ID using 2 identifiers. Patient agreed to proceed with visit via this method. Patient is at home, I am at office. Patient and I are present for visit.   Symptoms began yesterday Patient has cramping, diarrhea, chills and nausea Patient denies vomiting, myalgias, URI symptoms and cough Evaluation to date: Tylenol/Ibuprofen Sick contacts: none known  Past Medical History:  Diagnosis Date  . Cardiomyopathy   . Chicken pox   . Gout   . Hyperglycemia   . Hyperlipidemia   . Hypertension   . Hypogonadism in male   . OSA (obstructive sleep apnea) 05/19/2017  . Toxic effect of venom of bees    Review of Systems Constitutional: +fevers Gastrointestinal:  As noted in the HPI  Exam No conversational dyspnea Age appropriate judgment and insight Nml affect and mood  Assessment and Plan  Gastroenteritis - Plan: ondansetron (ZOFRAN) 4 MG tablet, dicyclomine (BENTYL) 10 MG capsule, Novel Coronavirus, NAA ([facility])  Dizziness - Plan: Push fluids w electrolytes.   F/u if symptoms fail to improve, sooner if worsening. The patient voiced understanding and agreement to the plan.  [place], DO 10/20/18  11:00 AM

## 2018-10-20 NOTE — Telephone Encounter (Signed)
Incoming call from Patient with a complaint of feeling dizziness, nausea chills and low grade fever. Rate is it mild. Onset was yesterday. Hear rate was 93, resp was 18. Change in head position aggravates it.  Transferred to to office to schedule appointment.         Reason for Disposition . [1] MODERATE dizziness (e.g., interferes with normal activities) AND [2] has NOT been evaluated by physician for this  (Exception: dizziness caused by heat exposure, sudden standing, or poor fluid intake)  Answer Assessment - Initial Assessment Questions 1. DESCRIPTION: "Describe your dizziness."     *No Answer* 2. LIGHTHEADED: "Do you feel lightheaded?" (e.g., somewhat faint, woozy, weak upon standing)      faint 3. VERTIGO: "Do you feel like either you or the room is spinning or tilting?" (i.e. vertigo)     denies 4. SEVERITY: "How bad is it?"  "Do you feel like you are going to faint?" "Can you stand and walk?"   - MILD - walking normally   - MODERATE - interferes with normal activities (e.g., work, school)    - SEVERE - unable to stand, requires support to walk, feels like passing out now.      mild 5. ONSET:  "When did the dizziness begin?"     24 hours ago 6. AGGRAVATING FACTORS: "Does anything make it worse?" (e.g., standing, change in head position)    Switching sides 7. HEART RATE: "Can you tell me your heart rate?" "How many beats in 15 seconds?"  (Note: not all patients can do this)          Heart rate  Is  93   Resp.  18  CAUSE: "What do you think is causing the dizziness?"      9. RECURRENT SYMPTOM: "Have you had dizziness before?" If so, ask: "When was the last time?" "What happened that time?"     denies 10. OTHER SYMPTOMS: "Do you have any other symptoms?" (e.g., fever, chest pain, vomiting, diarrhea, bleeding)       Denies, nauseated hot and cold fever of 98 or 99 11. PREGNANCY: "Is there any chance you are pregnant?" "When was your last menstrual period?"       na  Protocols  used: DIZZINESS [facility]

## 2018-10-21 LAB — NOVEL CORONAVIRUS, NAA: SARS-CoV-2, NAA: NOT DETECTED

## 2018-10-23 ENCOUNTER — Telehealth: Payer: Self-pay | Admitting: Family Medicine

## 2018-10-23 ENCOUNTER — Encounter: Payer: Self-pay | Admitting: Family Medicine

## 2018-10-23 NOTE — Telephone Encounter (Signed)
OK. Make sure he is feeling better and is fever free still. Ty.

## 2018-10-23 NOTE — Telephone Encounter (Signed)
Letter done and patient aware

## 2018-10-23 NOTE — Telephone Encounter (Signed)
Pt received the letter through [facility] and stated it need to say that pt has not restrictions also. Please advise and resend.

## 2018-10-23 NOTE — Telephone Encounter (Signed)
Copied from [place] [id number]. Topic: General - Other >> Oct 23, 2018 10:42 AM Celene Kras A wrote: Reason for CRM: Pt called and is requesting a letter for work stating he can return to work with no restrictions. Pt is requesting the letter through mychart. Please advise

## 2018-10-23 NOTE — Telephone Encounter (Signed)
No fever/feeling much better//patient contacted. Letter sent through my chartl.

## 2018-12-07 ENCOUNTER — Encounter: Payer: Self-pay | Admitting: Family Medicine

## 2018-12-07 ENCOUNTER — Other Ambulatory Visit: Payer: Self-pay

## 2018-12-07 ENCOUNTER — Ambulatory Visit (INDEPENDENT_AMBULATORY_CARE_PROVIDER_SITE_OTHER): Payer: BC Managed Care – PPO | Admitting: Family Medicine

## 2018-12-07 DIAGNOSIS — Z20822 Contact with and (suspected) exposure to covid-19: Secondary | ICD-10-CM

## 2018-12-07 DIAGNOSIS — J069 Acute upper respiratory infection, unspecified: Secondary | ICD-10-CM | POA: Diagnosis not present

## 2018-12-07 DIAGNOSIS — Z20828 Contact with and (suspected) exposure to other viral communicable diseases: Secondary | ICD-10-CM | POA: Diagnosis not present

## 2018-12-07 DIAGNOSIS — R519 Headache, unspecified: Secondary | ICD-10-CM | POA: Diagnosis not present

## 2018-12-07 MED ORDER — PREDNISONE 20 MG PO TABS: 40.0000 mg | ORAL_TABLET | Freq: Every day | ORAL | 0 refills | Status: AC

## 2018-12-07 MED FILL — predniSONE 20 MG TABS: 20 | 5 days supply | Qty: 10 | Fill #0

## 2018-12-07 NOTE — Progress Notes (Signed)
Chief Complaint  Patient presents with  . Headache    has been sick for one week.  . Sore Throat  . Fatigue    Bruce Harris here for URI complaints. Due to COVID-19 pandemic, we are interacting via web portal for an electronic face-to-face visit. I verified patient's ID using 2 identifiers. Patient agreed to proceed with visit via this method. Patient is in a parked car, I am at office. Patient and I are present for visit.   Duration: 1 week  Associated symptoms: sinus headache, sore throat and fatige Denies: sinus congestion, sinus pain, rhinorrhea, itchy watery eyes, ear pain, ear drainage, wheezing, shortness of breath, myalgias and fevers Treatment to date: ibuprofen, Excedrin Sick contacts: No  Screened for covid this AM.   ROS:  Const: Denies fevers HEENT: As noted in HPI Lungs: No SOB  Past Medical History:  Diagnosis Date  . Cardiomyopathy   . Chicken pox   . Gout   . Hyperglycemia   . Hyperlipidemia   . Hypertension   . Hypogonadism in male   . OSA (obstructive sleep apnea) 05/19/2017  . Toxic effect of venom of bees    Exam No conversational dyspnea Age appropriate judgment and insight Nml affect and mood  Upper respiratory tract infection, unspecified type - Plan: predniSONE (DELTASONE) 20 MG tablet  Acute intractable headache, unspecified headache type - Plan: predniSONE (DELTASONE) 20 MG tablet  Pred burst. supp care. If no improvement over next week, rtc. If starting to experience fevers, shaking, or shortness of breath, seek immediate care. Pt voiced understanding and agreement to the plan.  [place], DO 12/07/18 3:29 PM

## 2018-12-09 LAB — NOVEL CORONAVIRUS, NAA: SARS-CoV-2, NAA: NOT DETECTED

## 2018-12-10 ENCOUNTER — Telehealth: Payer: Self-pay | Admitting: *Deleted

## 2018-12-10 NOTE — Telephone Encounter (Signed)
Copied from [place] [id number]. Topic: General - Other >> Dec 10, 2018 11:55 AM Lennox Solders wrote: Reason for CRM:pt is calling and needs a letter to return to work without  restrictions. Pt would like the letter to state he can return to work tomorrow 12-11-2018. Ok to put in [employer]

## 2018-12-11 ENCOUNTER — Encounter: Payer: Self-pay | Admitting: Family Medicine

## 2018-12-11 NOTE — Telephone Encounter (Signed)
Letter done and sent through [facility] the patient left a detailed message letter done//and sent

## 2018-12-11 NOTE — Telephone Encounter (Signed)
OK 

## 2019-01-20 ENCOUNTER — Ambulatory Visit (INDEPENDENT_AMBULATORY_CARE_PROVIDER_SITE_OTHER): Payer: BC Managed Care – PPO | Admitting: Family Medicine

## 2019-01-20 ENCOUNTER — Encounter: Payer: Self-pay | Admitting: Family Medicine

## 2019-01-20 ENCOUNTER — Other Ambulatory Visit: Payer: Self-pay

## 2019-01-20 VITALS — BP 130/80 | HR 84 | Temp 97.2°F | Ht 69.0 in | Wt 230.1 lb

## 2019-01-20 DIAGNOSIS — G8929 Other chronic pain: Secondary | ICD-10-CM | POA: Diagnosis not present

## 2019-01-20 DIAGNOSIS — R519 Headache, unspecified: Secondary | ICD-10-CM

## 2019-01-20 DIAGNOSIS — R5383 Other fatigue: Secondary | ICD-10-CM | POA: Diagnosis not present

## 2019-01-20 DIAGNOSIS — R109 Unspecified abdominal pain: Secondary | ICD-10-CM

## 2019-01-20 MED ORDER — AMITRIPTYLINE HCL 25 MG PO TABS: 25.0000 mg | ORAL_TABLET | Freq: Every day | ORAL | 3 refills | Status: DC

## 2019-01-20 MED FILL — AMITRIPTYLINE HCL 25 MG TAB: 25 | 30 days supply | Qty: 30 | Fill #0

## 2019-01-20 NOTE — Patient Instructions (Signed)
If you do not hear anything about your referral in the next 1-2 weeks, call our office and ask for an update.  Keep the diet clean and stay active.  Give [name] 2-3 business days to get the results of your labs back.   EXERCISES RANGE OF MOTION (ROM) AND STRETCHING EXERCISES  These exercises may help you when beginning to rehabilitate your issue. In order to successfully resolve your symptoms, you must improve your posture. These exercises are designed to help reduce the forward-head and rounded-shoulder posture which contributes to this condition. Your symptoms may resolve with or without further involvement from your physician, physical therapist or athletic trainer. While completing these exercises, remember:   Restoring tissue flexibility helps normal motion to return to the joints. This allows healthier, less painful movement and activity.  An effective stretch should be held for at least 20 seconds, although you may need to begin with shorter hold times for comfort.  A stretch should never be painful. You should only feel a gentle lengthening or release in the stretched tissue.  Do not do any stretch or exercise that you cannot tolerate.  STRETCH- Axial Extensors  Lie on your back on the floor. You may bend your knees for comfort. Place a rolled-up hand towel or dish towel, about 2 inches in diameter, under the part of your head that makes contact with the floor.  Gently tuck your chin, as if trying to make a "double chin," until you feel a gentle stretch at the base of your head.  Hold 15-20 seconds. Repeat 2-3 times. Complete this exercise 1 time per day.   STRETCH - Axial Extension   Stand or sit on a firm surface. Assume a good posture: chest up, shoulders drawn back, abdominal muscles slightly tense, knees unlocked (if standing) and feet hip width apart.  Slowly retract your chin so your head slides back and your chin slightly lowers. Continue to look straight ahead.  You  should feel a gentle stretch in the back of your head. Be certain not to feel an aggressive stretch since this can cause headaches later.  Hold for 15-20 seconds. Repeat 2-3 times. Complete this exercise 1 time per day.  STRETCH - Cervical Side Bend   Stand or sit on a firm surface. Assume a good posture: chest up, shoulders drawn back, abdominal muscles slightly tense, knees unlocked (if standing) and feet hip width apart.  Without letting your nose or shoulders move, slowly tip your right / left ear to your shoulder until your feel a gentle stretch in the muscles on the opposite side of your neck.  Hold 15-20 seconds. Repeat 2-3 times. Complete this exercise 1-2 times per day.  STRETCH - Cervical Rotators   Stand or sit on a firm surface. Assume a good posture: chest up, shoulders drawn back, abdominal muscles slightly tense, knees unlocked (if standing) and feet hip width apart.  Keeping your eyes level with the ground, slowly turn your head until you feel a gentle stretch along the back and opposite side of your neck.  Hold 15-20 seconds. Repeat 2-3 times. Complete this exercise 1-2 times per day.  RANGE OF MOTION - Neck Circles   Stand or sit on a firm surface. Assume a good posture: chest up, shoulders drawn back, abdominal muscles slightly tense, knees unlocked (if standing) and feet hip width apart.  Gently roll your head down and around from the back of one shoulder to the back of the other. The motion should  never be forced or painful.  Repeat the motion 10-20 times, or until you feel the neck muscles relax and loosen. Repeat 2-3 times. Complete the exercise 1-2 times per day. STRENGTHENING EXERCISES - Cervical Strain and Sprain These exercises may help you when beginning to rehabilitate your injury. They may resolve your symptoms with or without further involvement from your physician, physical therapist, or athletic trainer. While completing these exercises, remember:    Muscles can gain both the endurance and the strength needed for everyday activities through controlled exercises.  Complete these exercises as instructed by your physician, physical therapist, or athletic trainer. Progress the resistance and repetitions only as guided.  You may experience muscle soreness or fatigue, but the pain or discomfort you are trying to eliminate should never worsen during these exercises. If this pain does worsen, stop and make certain you are following the directions exactly. If the pain is still present after adjustments, discontinue the exercise until you can discuss the trouble with your clinician.  STRENGTH - Cervical Flexors, Isometric  Face a wall, standing about 6 inches away. Place a small pillow, a ball about 6-8 inches in diameter, or a folded towel between your forehead and the wall.  Slightly tuck your chin and gently push your forehead into the soft object. Push only with mild to moderate intensity, building up tension gradually. Keep your jaw and forehead relaxed.  Hold 10 to 20 seconds. Keep your breathing relaxed.  Release the tension slowly. Relax your neck muscles completely before you start the next repetition. Repeat 2-3 times. Complete this exercise 1 time per day.  STRENGTH- Cervical Lateral Flexors, Isometric   Stand about 6 inches away from a wall. Place a small pillow, a ball about 6-8 inches in diameter, or a folded towel between the side of your head and the wall.  Slightly tuck your chin and gently tilt your head into the soft object. Push only with mild to moderate intensity, building up tension gradually. Keep your jaw and forehead relaxed.  Hold 10 to 20 seconds. Keep your breathing relaxed.  Release the tension slowly. Relax your neck muscles completely before you start the next repetition. Repeat 2-3 times. Complete this exercise 1 time per day.  STRENGTH - Cervical Extensors, Isometric   Stand about 6 inches away from a  wall. Place a small pillow, a ball about 6-8 inches in diameter, or a folded towel between the back of your head and the wall.  Slightly tuck your chin and gently tilt your head back into the soft object. Push only with mild to moderate intensity, building up tension gradually. Keep your jaw and forehead relaxed.  Hold 10 to 20 seconds. Keep your breathing relaxed.  Release the tension slowly. Relax your neck muscles completely before you start the next repetition. Repeat 2-3 times. Complete this exercise 1 time per day.  POSTURE AND BODY MECHANICS CONSIDERATIONS Keeping correct posture when sitting, standing or completing your activities will reduce the stress put on different body tissues, allowing injured tissues a chance to heal and limiting painful experiences. The following are general guidelines for improved posture. Your physician or physical therapist will provide you with any instructions specific to your needs. While reading these guidelines, remember:  The exercises prescribed by your provider will help you have the flexibility and strength to maintain correct postures.  The correct posture provides the optimal environment for your joints to work. All of your joints have less wear and tear when properly supported by  a spine with good posture. This means you will experience a healthier, less painful body.  Correct posture must be practiced with all of your activities, especially prolonged sitting and standing. Correct posture is as important when doing repetitive low-stress activities (typing) as it is when doing a single heavy-load activity (lifting).  PROLONGED STANDING WHILE SLIGHTLY LEANING FORWARD When completing a task that requires you to lean forward while standing in one place for a long time, place either foot up on a stationary 2- to 4-inch high object to help maintain the best posture. When both feet are on the ground, the low back tends to lose its slight inward curve. If  this curve flattens (or becomes too large), then the back and your other joints will experience too much stress, fatigue more quickly, and can cause pain.   RESTING POSITIONS Consider which positions are most painful for you when choosing a resting position. If you have pain with flexion-based activities (sitting, bending, stooping, squatting), choose a position that allows you to rest in a less flexed posture. You would want to avoid curling into a fetal position on your side. If your pain worsens with extension-based activities (prolonged standing, working overhead), avoid resting in an extended position such as sleeping on your stomach. Most people will find more comfort when they rest with their spine in a more neutral position, neither too rounded nor too arched. Lying on a non-sagging bed on your side with a pillow between your knees, or on your back with a pillow under your knees will often provide some relief. Keep in mind, being in any one position for a prolonged period of time, no matter how correct your posture, can still lead to stiffness.  WALKING Walk with an upright posture. Your ears, shoulders, and hips should all line up. OFFICE WORK When working at a desk, create an environment that supports good, upright posture. Without extra support, muscles fatigue and lead to excessive strain on joints and other tissues.  CHAIR:  A chair should be able to slide under your desk when your back makes contact with the back of the chair. This allows you to work closely.  The chair's height should allow your eyes to be level with the upper part of your monitor and your hands to be slightly lower than your elbows.  Body position: ? Your feet should make contact with the floor. If this is not possible, use a foot rest. ? Keep your ears over your shoulders. This will reduce stress on your neck and low back.

## 2019-01-20 NOTE — Progress Notes (Signed)
Chief Complaint  Patient presents with  . FMLA    Subjective: Patient is a 48 y.o. male here for f/u.  Pt has a 6 mo hx of a variety of issues that all seem to correlate.  He has chronic headaches affecting him around 3 days per week on average. No neurologic s/s;s otherwise, no vision changes. No exertional exacerbation. No current HA.  Hx of lower abd pain. Bentyl has helped in past. Never had colonoscopy. No bowel changes. No weight loss or bleeding.  Has had fatigue as well. Will get extreme fatigue 1-2 times per week, but always feels tired. He does have a hx of sleep apnea. Reports being active at work, diet is clean.    ROS: GI: +pain Neuro: +headaches  Past Medical History:  Diagnosis Date  . Cardiomyopathy   . Chicken pox   . Gout   . Hyperglycemia   . Hyperlipidemia   . Hypertension   . Hypogonadism in male   . OSA (obstructive sleep apnea) 05/19/2017  . Toxic effect of venom of bees     Objective: BP (!) 140/98 (BP Location: Left Arm, Patient Position: Sitting, Cuff Size: Large)   Pulse 84   Temp (!) 97.2 F (36.2 C) (Temporal)   Ht 5' 9"  (1.753 m)   Wt 230 lb 2 oz (104.4 kg)   SpO2 95%   BMI 33.98 kg/m  General: Awake, appears stated age HEENT: MMM, EOMi MSK: mild ttp over parasp msc in cerv region Heart: RRR, no bruits, no LE edema Lungs: CTAB, no rales, wheezes or rhonchi. No accessory muscle use Abd: BS+, S, ND, mild ttp in lower quadrants b/l, no masses or organomegaly Psych: Age appropriate judgment and insight, normal affect and mood  Assessment and Plan: Chronic nonintractable headache, unspecified headache type - Plan: amitriptyline (ELAVIL) 25 MG tablet  Chronic abdominal pain - Plan: amitriptyline (ELAVIL) 25 MG tablet, Ambulatory referral to Gastroenterology  Fatigue, unspecified type - Plan: TSH, T4, free, CBC, Comp Met (CMET), Vitamin D (25 hydroxy)  Start Elavil for freq HA's and possible IBS. Will get GI's opinion.  Ck labs.  F/u  in 1 mo. The patient voiced understanding and agreement to the plan.  [place], DO 01/20/19  4:09 PM

## 2019-01-21 ENCOUNTER — Other Ambulatory Visit: Payer: Self-pay | Admitting: Family Medicine

## 2019-01-21 LAB — CBC
HCT: 42.6 % (ref 39.0–52.0)
Hemoglobin: 13.5 g/dL (ref 13.0–17.0)
MCHC: 31.8 g/dL (ref 30.0–36.0)
MCV: 81.2 fl (ref 78.0–100.0)
Platelets: 264 K/uL (ref 150.0–400.0)
RBC: 5.24 Mil/uL (ref 4.22–5.81)
RDW: 15.1 % (ref 11.5–15.5)
WBC: 7.2 K/uL (ref 4.0–10.5)

## 2019-01-21 LAB — COMPREHENSIVE METABOLIC PANEL WITH GFR
ALT: 17 U/L (ref 0–53)
AST: 26 U/L (ref 0–37)
Albumin: 4.3 g/dL (ref 3.5–5.2)
Alkaline Phosphatase: 72 U/L (ref 39–117)
BUN: 19 mg/dL (ref 6–23)
CO2: 26 meq/L (ref 19–32)
Calcium: 9.2 mg/dL (ref 8.4–10.5)
Chloride: 107 meq/L (ref 96–112)
Creatinine, Ser: 1.52 mg/dL — ABNORMAL HIGH (ref 0.40–1.50)
GFR: 59.44 mL/min — ABNORMAL LOW (ref >60.00)
Glucose, Bld: 92 mg/dL (ref 70–99)
Potassium: 4 meq/L (ref 3.5–5.1)
Sodium: 142 meq/L (ref 135–145)
Total Bilirubin: 0.5 mg/dL (ref 0.2–1.2)
Total Protein: 7.3 g/dL (ref 6.0–8.3)

## 2019-01-21 LAB — T4, FREE: Free T4: 0.9 ng/dL (ref 0.60–1.60)

## 2019-01-21 LAB — VITAMIN D 25 HYDROXY (VIT D DEFICIENCY, FRACTURES): VITD: 17.35 ng/mL — ABNORMAL LOW (ref 30.00–100.00)

## 2019-01-21 LAB — TSH: TSH: 1.25 u[IU]/mL (ref 0.35–4.50)

## 2019-01-21 MED ORDER — VITAMIN D (ERGOCALCIFEROL) 1.25 MG (50000 UNIT) PO CAPS: 50000.0000 [IU] | ORAL_CAPSULE | ORAL | 0 refills | Status: DC

## 2019-01-21 MED FILL — VIT D2 1.25 MG (50,000 UNIT: 1.25 MG | 28 days supply | Qty: 4 | Fill #0

## 2019-01-22 ENCOUNTER — Encounter: Payer: Self-pay | Admitting: Gastroenterology

## 2019-02-11 ENCOUNTER — Telehealth: Payer: Self-pay

## 2019-02-11 NOTE — Telephone Encounter (Signed)
Called patient and left voicemail for patient to call back.

## 2019-02-12 ENCOUNTER — Telehealth (INDEPENDENT_AMBULATORY_CARE_PROVIDER_SITE_OTHER): Payer: BC Managed Care – PPO | Admitting: Gastroenterology

## 2019-02-12 ENCOUNTER — Other Ambulatory Visit: Payer: Self-pay

## 2019-02-12 ENCOUNTER — Other Ambulatory Visit: Payer: Self-pay | Admitting: Family Medicine

## 2019-02-12 DIAGNOSIS — R140 Abdominal distension (gaseous): Secondary | ICD-10-CM

## 2019-02-12 DIAGNOSIS — R112 Nausea with vomiting, unspecified: Secondary | ICD-10-CM | POA: Diagnosis not present

## 2019-02-12 DIAGNOSIS — R1084 Generalized abdominal pain: Secondary | ICD-10-CM | POA: Diagnosis not present

## 2019-02-12 MED FILL — VIT D2 1.25 MG (50,000 UNIT: 1.25 MG | 28 days supply | Qty: 4 | Fill #0

## 2019-02-12 NOTE — Progress Notes (Signed)
Chief Complaint: Episodic abdominal pain  Referring Provider:  Shelda Pal*      ASSESSMENT AND PLAN;   #1.  Episodic generalized abdominal pain with bloating, nausea/vomiting. R/O PSBO #2.  Above episodes resolve after he has diarrhea.  Plan: -CT AP with p.o. and IV contrast. -FU in 6 weeks in person.  If still with problems, would proceed with EGD/colonoscopy for further evaluation. -For now we will continue Elavil at bedtime and Bentyl on as needed basis -Not having significant heartburn, hence we will hold off on empiric PPIs   HPI:    Bruce Harris is a 48 y.o. male  For televisit (video visit failed) Episodic generalized abdominal pain with bloating 2-3 times /month x about a year.  Getting worse.  This is associated with severe nausea, salivation.  Lasts for about 6 to 8 hours, then gets better after diarrhea.  Feels like his intestines are getting " twisted".  He could not identify any definite exacerbating factors except citrus foods at times to exacerbate problems.  Tested negative for COVID-19 previously.  Denies having any significant heartburn.  He is finding between the above episodes.  No change in urine color, family history of any significant GI complaints or problems (except mom with gallbladder problems).  No drug intake.   No sodas, chocolates, chewing gums, artificial sweeteners and candy. No NSAIDs  In between the episodes- No nausea, vomiting, heartburn, regurgitation, odynophagia or dysphagia.  No significant diarrhea or constipation.  No melena or hematochezia. No unintentional weight loss. No abdominal pain.  Has been given a trial of Elavil and Bentyl without any significant relief.  He feels like there is a lump in the abdomen.   Past Medical History:  Diagnosis Date  . Cardiomyopathy   . Chicken pox   . Gout   . Hyperglycemia   . Hyperlipidemia   . Hypertension   . Hypogonadism in male   . OSA (obstructive sleep apnea)  05/19/2017  . Toxic effect of venom of bees     Past Surgical History:  Procedure Laterality Date  . HAND SURGERY     Right    Family History  Problem Relation Age of Onset  . Coronary artery disease Mother        Living  . Diabetes Mother   . Hyperlipidemia Mother   . Hypertension Mother   . Hypertension Father        Living  . Heart disease Father   . Diabetes Maternal Grandmother   . Kidney failure Maternal Grandmother   . Healthy Brother        x1  . Healthy Sister        x2  . Healthy Son        x2    Social History   Tobacco Use  . Smoking status: Never Smoker  . Smokeless tobacco: Never Used  Substance Use Topics  . Alcohol use: Yes    Comment: social  . Drug use: No    Current Outpatient Medications  Medication Sig Dispense Refill  . amitriptyline (ELAVIL) 25 MG tablet Take 1 tablet (25 mg total) by mouth at bedtime. 30 tablet 3  . amLODipine-valsartan (EXFORGE) 10-320 MG tablet Take 1 tablet by mouth daily. 30 tablet 5  . dicyclomine (BENTYL) 10 MG capsule Take 1 capsule (10 mg total) by mouth 4 (four) times daily -  before meals and at bedtime. 30 capsule 0  . levocetirizine (XYZAL) 5 MG tablet Take 1  tablet (5 mg total) by mouth every evening. 30 tablet 2  . ondansetron (ZOFRAN) 4 MG tablet Take 1 tablet (4 mg total) by mouth every 8 (eight) hours as needed. 20 tablet 0  . rosuvastatin (CRESTOR) 20 MG tablet Take 1 tablet (20 mg total) by mouth daily. 90 tablet 3  . Vitamin D, Ergocalciferol, (DRISDOL) 1.25 MG (50000 UT) CAPS capsule Take 1 capsule (50,000 Units total) by mouth every 7 (seven) days. 12 capsule 0   No current facility-administered medications for this visit.    Allergies  Allergen Reactions  . Bee Venom Swelling    Review of Systems:  Constitutional: Denies fever, chills, diaphoresis, appetite change and fatigue.  HEENT: Denies photophobia, eye pain, redness, hearing loss, ear pain, congestion, sore throat, rhinorrhea, sneezing,  mouth sores, neck pain, neck stiffness and tinnitus.   Respiratory: Denies SOB, DOE, cough, chest tightness,  and wheezing.   Cardiovascular: Denies chest pain, palpitations and leg swelling.  Genitourinary: Denies dysuria, urgency, frequency, hematuria, flank pain and difficulty urinating.  Musculoskeletal: Denies myalgias, back pain, joint swelling, arthralgias and gait problem.  Skin: No rash.  Neurological: Denies dizziness, seizures, syncope, weakness, light-headedness, numbness and headaches.  Hematological: Denies adenopathy. Easy bruising, personal or family bleeding history  Psychiatric/Behavioral: No anxiety or depression     Physical Exam:    There were no vitals taken for this visit. Wt Readings from Last 3 Encounters:  01/20/19 230 lb 2 oz (104.4 kg)  05/13/18 226 lb 6 oz (102.7 kg)  12/12/17 226 lb 2 oz (102.6 kg)   Not examined since it was a televisit  Data Reviewed: I have personally reviewed following labs and imaging studies  CBC: CBC Latest Ref Rng & Units 01/20/2019 10/18/2016 10/13/2013  WBC 4.0 - 10.5 K/uL 7.2 5.0 4.8  Hemoglobin 13.0 - 17.0 g/dL 13.5 14.1 14.2  Hematocrit 39.0 - 52.0 % 42.6 44.2 42.9  Platelets 150.0 - 400.0 K/uL 264.0 262.0 259    CMP: CMP Latest Ref Rng & Units 01/20/2019 04/04/2017 10/18/2016  Glucose 70 - 99 mg/dL 92 - 92  BUN 6 - 23 mg/dL 19 - 14  Creatinine 0.40 - 1.50 mg/dL 1.52(H) - 1.40  Sodium 135 - 145 mEq/L 142 - 141  Potassium 3.5 - 5.1 mEq/L 4.0 - 4.2  Chloride 96 - 112 mEq/L 107 - 106  CO2 19 - 32 mEq/L 26 - 29  Calcium 8.4 - 10.5 mg/dL 9.2 - 9.5  Total Protein 6.0 - 8.3 g/dL 7.3 8.1 7.3  Total Bilirubin 0.2 - 1.2 mg/dL 0.5 0.6 0.5  Alkaline Phos 39 - 117 U/L 72 59 54  AST 0 - 37 U/L 26 22 26   ALT 0 - 53 U/L 17 16 17    I connected with  [name] on 02/12/19 by telephone as video visit was unsuccessful (d/t pt's phone connection, I tried video visit first) and verified that I am speaking with the correct  person.   I discussed the limitations of evaluation and management by telemedicine. The patient expressed understanding and agreed to proceed.     Carmell Austria, MD 02/12/2019, 9:00 AM  Cc: Shelda Pal*

## 2019-02-12 NOTE — Patient Instructions (Addendum)
If you are age 48 or older, your body mass index should be between 23-30. Your There is no height or weight on file to calculate BMI. If this is out of the aforementioned range listed, please consider follow up with your Primary Care Provider.  If you are age 64 or younger, your body mass index should be between 19-25. Your There is no height or weight on file to calculate BMI. If this is out of the aformentioned range listed, please consider follow up with your Primary Care Provider.   We have sent the following medications to your pharmacy for you to pick up at your convenience: 1. Elavil nightly 2. Bentyl as needed.   You have been scheduled for a CT scan of the abdomen and pelvis at [facility] ([address]---this is in the same building as [facility]).   You are scheduled on Thursday 02/18/19 at 2:30 pm. You should arrive 15 minutes prior to your appointment time for registration. Please follow the written instructions below on the day of your exam:  WARNING: IF YOU ARE ALLERGIC TO IODINE/X-RAY DYE, PLEASE NOTIFY RADIOLOGY IMMEDIATELY AT [phone]! YOU WILL BE GIVEN A 13 HOUR PREMEDICATION PREP.  1) Do not eat or drink anything after 10:30 am (4 hours prior to your test) 2) You have been given 2 bottles of oral contrast to drink. The solution may taste better if refrigerated, but do NOT add ice or any other liquid to this solution. Shake well before drinking.    Drink 1 bottle of contrast 2 hours prior to your exam.  Drink 1 bottle of contrast 1 hour prior to your exam.  You may take any medications as prescribed with a small amount of water, if necessary. If you take any of the following medications: METFORMIN, GLUCOPHAGE, GLUCOVANCE, AVANDAMET, RIOMET, FORTAMET, ACTOPLUS MET, JANUMET, GLUMETZA or METAGLIP, you MAY be asked to HOLD this medication 48 hours AFTER the exam.  The purpose of you drinking the oral contrast is to aid in the visualization of your  intestinal tract. The contrast solution may cause some diarrhea. Depending on your individual set of symptoms, you may also receive an intravenous injection of x-ray contrast/dye. Plan on being at [facility] for 30 minutes or longer, depending on the type of exam you are having performed.  This test typically takes 30-45 minutes to complete.  If you have any questions regarding your exam or if you need to reschedule, you may call the CT department at [phone] between the hours of 8:00 am and 5:00 pm, Monday-Friday.  ________________________________________________________________________   

## 2019-02-18 ENCOUNTER — Other Ambulatory Visit: Payer: Self-pay

## 2019-02-18 ENCOUNTER — Ambulatory Visit (INDEPENDENT_AMBULATORY_CARE_PROVIDER_SITE_OTHER)
Admission: RE | Admit: 2019-02-18 | Discharge: 2019-02-18 | Disposition: A | Discharge status: Home / Self Care | Payer: BC Managed Care – PPO | Source: Ambulatory Visit | Attending: Gastroenterology | Admitting: Gastroenterology

## 2019-02-18 DIAGNOSIS — R112 Nausea with vomiting, unspecified: Secondary | ICD-10-CM

## 2019-02-18 DIAGNOSIS — R1084 Generalized abdominal pain: Secondary | ICD-10-CM

## 2019-02-18 DIAGNOSIS — R1110 Vomiting, unspecified: Secondary | ICD-10-CM | POA: Diagnosis not present

## 2019-02-18 DIAGNOSIS — R140 Abdominal distension (gaseous): Secondary | ICD-10-CM

## 2019-02-18 MED ORDER — IOHEXOL 300 MG/ML  SOLN
100.0000 mL | Freq: Once | INTRAMUSCULAR | Status: AC | PRN
  Administered 2019-02-18: 100 mL via INTRAVENOUS

## 2019-02-19 ENCOUNTER — Ambulatory Visit: Payer: BC Managed Care – PPO | Admitting: Family Medicine

## 2019-03-01 DIAGNOSIS — G4733 Obstructive sleep apnea (adult) (pediatric): Secondary | ICD-10-CM | POA: Diagnosis not present

## 2019-03-22 ENCOUNTER — Other Ambulatory Visit: Payer: Self-pay | Admitting: Family Medicine

## 2019-03-22 DIAGNOSIS — E785 Hyperlipidemia, unspecified: Secondary | ICD-10-CM

## 2019-03-22 MED ORDER — ROSUVASTATIN CALCIUM 20 MG PO TABS: 20.0000 mg | ORAL_TABLET | Freq: Every day | ORAL | 1 refills | Status: DC

## 2019-03-22 MED FILL — ROSUVASTATIN CALCIUM 20 MG: 20 | 30 days supply | Qty: 30 | Fill #0

## 2019-03-22 MED FILL — AMLODIPINE-VALSARTAN 10-320: 10-320 | 30 days supply | Qty: 30 | Fill #0

## 2019-03-29 ENCOUNTER — Ambulatory Visit: Payer: Self-pay | Admitting: Gastroenterology

## 2019-03-31 MED FILL — ROSUVASTATIN CALCIUM 20 MG: 20 | 30 days supply | Qty: 30 | Fill #0

## 2019-03-31 MED FILL — AMLODIPINE-VALSARTAN 10-320: 10-320 | 30 days supply | Qty: 30 | Fill #0

## 2019-04-09 ENCOUNTER — Ambulatory Visit: Payer: BC Managed Care – PPO | Admitting: Family Medicine

## 2019-04-09 ENCOUNTER — Telehealth: Payer: Self-pay | Admitting: Family Medicine

## 2019-04-09 DIAGNOSIS — Z0289 Encounter for other administrative examinations: Secondary | ICD-10-CM

## 2019-04-09 NOTE — Telephone Encounter (Signed)
Pt called at 3:58pm stating his job held him over and he would not make the appt at 4:00pm today

## 2019-04-21 ENCOUNTER — Ambulatory Visit (INDEPENDENT_AMBULATORY_CARE_PROVIDER_SITE_OTHER): Payer: BC Managed Care – PPO | Admitting: Family Medicine

## 2019-04-21 ENCOUNTER — Other Ambulatory Visit: Payer: Self-pay

## 2019-04-21 ENCOUNTER — Encounter: Payer: Self-pay | Admitting: Family Medicine

## 2019-04-21 VITALS — BP 140/90 | HR 84 | Temp 97.6°F | Ht 69.0 in | Wt 235.1 lb

## 2019-04-21 DIAGNOSIS — E559 Vitamin D deficiency, unspecified: Secondary | ICD-10-CM

## 2019-04-21 DIAGNOSIS — R7989 Other specified abnormal findings of blood chemistry: Secondary | ICD-10-CM | POA: Diagnosis not present

## 2019-04-21 DIAGNOSIS — I10 Essential (primary) hypertension: Secondary | ICD-10-CM | POA: Diagnosis not present

## 2019-04-21 NOTE — Progress Notes (Signed)
Chief Complaint  Bruce Harris presents with  . Follow-up    Discuss testosterone    Subjective: Bruce Harris is a 49 y.o. male here for T discussion.  Pt has tested low for T in past, nml Free T w me. Tested w urology team and was low. They offered to tx him. He had not taken them up on that as of yet. Interested in different options available.  Hypertension Bruce Harris presents for hypertension follow up. He does monitor home blood pressures. Blood pressures ranging on average from 130-140's/80-90's. He is compliant with medications- Exforge 10-320 mg/d. Bruce Harris has these side effects of medication: none He is sometimes adhering to a healthy diet overall. Exercise: active at work  ROS: Heart: Denies chest pain  Lungs: Denies SOB   Past Medical History:  Diagnosis Date  . Cardiomyopathy   . Chicken pox   . Gout   . Hyperglycemia   . Hyperlipidemia   . Hypertension   . Hypogonadism in male   . OSA (obstructive sleep apnea) 05/19/2017  . Toxic effect of venom of bees     Objective: BP 140/90 (BP Location: Left Arm, Bruce Harris Position: Sitting, Cuff Size: Large)   Pulse 84   Temp 97.6 F (36.4 C) (Temporal)   Ht 5\' 9"  (1.753 m)   Wt 235 lb 2 oz (106.7 kg)   SpO2 95%   BMI 34.72 kg/m  General: Awake, appears stated age HEENT: MMM, EOMi Heart: RRR, no bruits, no LE edema Lungs: CTAB, no rales, wheezes or rhonchi. No accessory muscle use Psych: Age appropriate judgment and insight, normal affect and mood  Assessment and Plan: Low testosterone in male  Essential hypertension  Vitamin D deficiency - Plan: VITAMIN D 25 Hydroxy (Vit-D Deficiency, Fractures)  Discussed injections vs topical. Rec'd he f/u with Urology should he decide to go thru treatment and they may have other options.  Offered adding med vs lifestyle changes. He opted for the latter.  For Vt D, will reck at his f/u in 1 mo The Bruce Harris voiced understanding and agreement to the plan.  [place],  DO 04/21/19  8:14 PM

## 2019-04-21 NOTE — Telephone Encounter (Signed)
Per Dr. Rosita Kea please dis reg and remove no show on 04/09/19 ... Please

## 2019-04-21 NOTE — Patient Instructions (Signed)
Keep the diet clean and stay active. Keep checking your blood pressure at home.  Call your urologist regarding testosterone replacement.   Let us know if you need anything.

## 2019-05-03 ENCOUNTER — Telehealth: Payer: Self-pay | Admitting: Pulmonary Disease

## 2019-05-03 DIAGNOSIS — G4733 Obstructive sleep apnea (adult) (pediatric): Secondary | ICD-10-CM

## 2019-05-03 NOTE — Telephone Encounter (Signed)
He would need face to face visit first prior to arranging for home sleep study.  Visit can be with me or NP.

## 2019-05-03 NOTE — Telephone Encounter (Signed)
His home sleep study from 05/18/17 showed moderate obstructive sleep apnea.  I have no information to show that his sleep apnea has improved.  I can not write a letter for his employer stating that I recommended he stop using CPAP.  The order was placed only so he would no longer be charged for CPAP since he stopped using CPAP on his own.    His options are 1) resume using CPAP and we can try to adjust the set up so that it is more comfortable to use, 2) consider alternative therapies for sleep apnea (oral appliance or surgery), 3) repeat sleep study to determine if he still has sleep apnea.

## 2019-05-03 NOTE — Telephone Encounter (Signed)
Called and spoke to pt. Pt states he recently hasnt been using his CPAP machine and would like an order to d/c the machine. Pt using [facility]. Pt was last seen in 08/2017. Pt states he was just on a 'trial' of the CPAP since he has borderline OSA. Pt states he no longer wants it. Pt is in [place] and does confirm noncompliance.   Dr. Halford Chessman please advise. Thanks.

## 2019-05-03 NOTE — Telephone Encounter (Signed)
Called and spoke to pt to inform him of the d/c order. While speaking with pt he informed me he also would like a letter for work stating he is no longer on the CPAP. 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 I advised the pt that he still has sleep apnea and this would still require treatment as it is documented as moderate sleep apnea in his chart. Advised pt that DOT will require compliance with his CPAP if he has OSA. Pt still requested I send message to [facility] to ask about writing a letter. P[facility]s have pended the order for the CPAP d/c until hearing back from [facility].   Dr. Halford Chessman please advise. Thanks.

## 2019-05-03 NOTE — Telephone Encounter (Signed)
Called and spoke with Patient. Dr. Juanetta Gosling recommendations given.  Understanding stated.  Patient scheduled 05/07/19 at 4pm with Eustaquio Maize, NP.  Nothing further at this time.

## 2019-05-03 NOTE — Telephone Encounter (Signed)
Spoke with patient. He stated that he is interested in doing another sleep study to see if he still has OSA.   Dr. Halford Chessman, please advise what type of sleep study you would like for him to have. I asked him if he had been started on any type of oxygen therapy since 2019, he stated he has not.

## 2019-05-03 NOTE — Telephone Encounter (Signed)
Okay to send order to d/c CPAP set up.

## 2019-05-07 ENCOUNTER — Ambulatory Visit: Payer: BC Managed Care – PPO | Admitting: Primary Care

## 2019-05-07 NOTE — Progress Notes (Deleted)
@Patient  ID: Bruce Harris, male    DOB: 07-03-1970, 49 y.o.   MRN: [id number]  No chief complaint on file.   Referring provider: Shelda Pal*  HPI: 49 year old male, never smoked. PMH OSA, HTN, cardiomyopathy, hyperlipidemia, obesity. Patient of Dr. Halford Chessman, last seen on 08/11/17. HST in March 2019 showed moderate obstructive sleep apnea. Previously maintained on CPAP. Recommend annual follow-up. DME company is [employer].   05/07/2019 Patient presents today for OSA follow-up. Patient called office on 3/1 to ask if it was ok that he stopped using CPAP machine. Dr. Halford Chessman said it was ok to send machine back, patient then asking for letter stating that he no longer needed CPAP therapy. Patient is a driver and needs letter for DOT.   Options are resume using cpap and we can adjust set up so that it is more comfortable, second would be to consider alternative therapies for sleep apnea such as oral appliance of surgery or third repeat sleep study to determine if he still has sleep apnea.           Sleep tests: HST 05/18/17 >> AHI 16.6, SaO2 low 68% Auto CPAP 07/11/17 to 08/09/17 >> used on 26 of 30 nights with average 4 hrs 5 min.  Average AHI 1.7 with median CPAP 8 and 95 th percentile CPAP 11 cm H2O    Allergies  Allergen Reactions  . Bee Venom Swelling    Immunization History  Administered Date(s) Administered  . Influenza Whole 12/30/2005, 12/10/2007  . Td 09/10/2004  . Tdap 12/12/2017    Past Medical History:  Diagnosis Date  . Cardiomyopathy   . Chicken pox   . Gout   . Hyperglycemia   . Hyperlipidemia   . Hypertension   . Hypogonadism in male   . OSA (obstructive sleep apnea) 05/19/2017  . Toxic effect of venom of bees     Tobacco History: Social History   Tobacco Use  Smoking Status Never Smoker  Smokeless Tobacco Never Used   Counseling given: Not Answered   Outpatient Medications Prior to Visit  Medication Sig Dispense Refill  .  amitriptyline (ELAVIL) 25 MG tablet Take 1 tablet (25 mg total) by mouth at bedtime. 30 tablet 3  . amLODipine-valsartan (EXFORGE) 10-320 MG tablet TAKE 1 TABLET BY MOUTH DAILY. 30 tablet 5  . dicyclomine (BENTYL) 10 MG capsule Take 1 capsule (10 mg total) by mouth 4 (four) times daily -  before meals and at bedtime. 30 capsule 0  . levocetirizine (XYZAL) 5 MG tablet Take 1 tablet (5 mg total) by mouth every evening. 30 tablet 2  . ondansetron (ZOFRAN) 4 MG tablet Take 1 tablet (4 mg total) by mouth every 8 (eight) hours as needed. 20 tablet 0  . rosuvastatin (CRESTOR) 20 MG tablet Take 1 tablet (20 mg total) by mouth daily. 90 tablet 1  . Vitamin D, Ergocalciferol, (DRISDOL) 1.25 MG (50000 UT) CAPS capsule Take 1 capsule (50,000 Units total) by mouth every 7 (seven) days. 12 capsule 0   No facility-administered medications prior to visit.      Review of Systems  Review of Systems   Physical Exam  There were no vitals taken for this visit. Physical Exam   Lab Results:  CBC    Component Value Date/Time   WBC 7.2 01/20/2019 1555   RBC 5.24 01/20/2019 1555   HGB 13.5 01/20/2019 1555   HCT 42.6 01/20/2019 1555   PLT 264.0 01/20/2019 1555   MCV 81.2  01/20/2019 1555   MCH 26.2 10/13/2013 0948   MCHC 31.8 01/20/2019 1555   RDW 15.1 01/20/2019 1555   LYMPHSABS 2.0 10/18/2016 1010   MONOABS 0.4 10/18/2016 1010   EOSABS 0.2 10/18/2016 1010   BASOSABS 0.0 10/18/2016 1010    BMET    Component Value Date/Time   NA 142 01/20/2019 1555   K 4.0 01/20/2019 1555   CL 107 01/20/2019 1555   CO2 26 01/20/2019 1555   GLUCOSE 92 01/20/2019 1555   BUN 19 01/20/2019 1555   CREATININE 1.52 (H) 01/20/2019 1555   CREATININE 1.32 10/13/2013 0948   CALCIUM 9.2 01/20/2019 1555   GFRNONAA 66 10/13/2013 0948   GFRAA 76 10/13/2013 0948    BNP No results found for: BNP  ProBNP No results found for: PROBNP  Imaging: No results found.   Assessment & Plan:   No problem-specific  Assessment & Plan notes found for this encounter.     Martyn Ehrich, NP 05/07/2019

## 2019-05-10 ENCOUNTER — Ambulatory Visit: Payer: BC Managed Care – PPO | Admitting: Primary Care

## 2019-05-21 ENCOUNTER — Ambulatory Visit (INDEPENDENT_AMBULATORY_CARE_PROVIDER_SITE_OTHER): Payer: BC Managed Care – PPO | Admitting: Family Medicine

## 2019-05-21 ENCOUNTER — Encounter: Payer: Self-pay | Admitting: Family Medicine

## 2019-05-21 ENCOUNTER — Other Ambulatory Visit: Payer: Self-pay

## 2019-05-21 DIAGNOSIS — I10 Essential (primary) hypertension: Secondary | ICD-10-CM

## 2019-05-21 NOTE — Progress Notes (Signed)
Chief Complaint  Patient presents with  . Follow-up    blood pressure    Subjective Bruce Harris is a 49 y.o. male who presents for hypertension follow up. Due to COVID-19 pandemic, we are interacting via web portal for an electronic face-to-face visit. I verified patient's ID using 2 identifiers. Patient agreed to proceed with visit via this method. Patient is in a parked car, I am at office. Patient and I are present for visit.  He does monitor home blood pressures. Blood pressures ranging from 120-130's/80's on average. He is compliant with medications- Exforge 10-320 mg/d. Patient has these side effects of medication: none He is usually adhering to a healthy diet overall. Current exercise: walking, active at work Has lost some weight.    Past Medical History:  Diagnosis Date  . Cardiomyopathy   . Chicken pox   . Gout   . Hyperglycemia   . Hyperlipidemia   . Hypertension   . Hypogonadism in male   . OSA (obstructive sleep apnea) 05/19/2017  . Toxic effect of venom of bees     Review of Systems Cardiovascular: no chest pain Respiratory:  no shortness of breath  Exam No conversational dyspnea Age appropriate judgment and insight Nml affect and mood  Essential hypertension  Cont meds. Sounds like he did well with diet and exercise. Will have him come back to ck Vit D.  F/u in 6 mo for CPE or prn. The patient voiced understanding and agreement to the plan.  [place], DO 05/21/19  11:05 AM

## 2019-06-01 MED FILL — LEVOCETIRIZINE 5 MG TABLET: 5 | 30 days supply | Qty: 30 | Fill #1

## 2019-06-01 MED FILL — AMLODIPINE-VALSARTAN 10-320: 10-320 | 30 days supply | Qty: 30 | Fill #1

## 2019-07-19 ENCOUNTER — Other Ambulatory Visit: Payer: Self-pay

## 2019-07-19 ENCOUNTER — Ambulatory Visit (INDEPENDENT_AMBULATORY_CARE_PROVIDER_SITE_OTHER): Payer: BC Managed Care – PPO | Admitting: Medical

## 2019-07-19 VITALS — BP 174/100 | HR 86 | Temp 98.4°F | Resp 18 | Ht 69.0 in | Wt 224.0 lb

## 2019-07-19 DIAGNOSIS — M25512 Pain in left shoulder: Secondary | ICD-10-CM | POA: Diagnosis not present

## 2019-07-19 MED ORDER — HYDROCODONE-ACETAMINOPHEN 5-325 MG PO TABS: 1.0000 | ORAL_TABLET | Freq: Four times a day (QID) | ORAL | 0 refills | Status: DC | PRN

## 2019-07-19 MED FILL — HYDROCODON-APAP 5-325: 5-325 | 7 days supply | Qty: 30 | Fill #0

## 2019-07-19 NOTE — Patient Instructions (Addendum)
I am referring [facility] to [facility] for your shoulder pain. I do want you to go ahead and call their office tomorrow to expedite your appointment.  I want to minimize the number of days you might miss some work.  Presently use Norco for severe pain.  Stop NSAIDs/ibuprofen as that can increase your blood pressure.  Your blood pressure is very high today but you did report not using your blood pressure medication for 2 days.  Please restart today.  Also as stated above stop any NSAIDs.  Please get over-the-counter blood pressure cuff and start to check your blood pressure daily.  Would like to your blood pressure be closer to 140/90 within about 3 to 4 days.  Follow-up in 7 to 10 days or as needed.

## 2019-07-19 NOTE — Progress Notes (Signed)
Subjective:    Patient ID: Bruce Harris, male    DOB: 1970-12-19, 49 y.o.   MRN: [id number]  HPI  Pt in with left shoulder pain. Pain states he was turning a steering wheel at work when felt onset of pain. 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 Pain gradually has become worse and shoulder tight. Pain is increasing daily and hurts with movement.   Pt states last Tuesday he got covid vaccine He got J and J vaccine.  Pain on Friday morning.   Pt states he had mild soreness to j and j vaccine(he points to proper location for injection). Then no side effects.  Pt bp is high. No gross or motor sensory functio deficits. He has not been on bp med for 2 days.   Pt has been on ibuprofen  600 mg .   Review of Systems  Constitutional: Negative for chills, fatigue and fever.  Respiratory: Negative for cough, chest tightness, shortness of breath and wheezing.   Cardiovascular: Negative for chest pain and palpitations.  Gastrointestinal: Negative for abdominal pain.  Musculoskeletal: Negative for back pain.       Left shoulder pain. See hpi.  Skin: Negative for rash.  Neurological: Negative for dizziness and headaches.  Hematological: Negative for adenopathy. Does not bruise/bleed easily.  Psychiatric/Behavioral: Negative for behavioral problems, hallucinations and suicidal ideas. The patient is not nervous/anxious.     Past Medical History:  Diagnosis Date  . Cardiomyopathy   . Chicken pox   . Gout   . Hyperglycemia   . Hyperlipidemia   . Hypertension   . Hypogonadism in male   . OSA (obstructive sleep apnea) 05/19/2017  . Toxic effect of venom of bees      Social History   Socioeconomic History  . Marital status: Married    Spouse name: Not on file  . Number of children: Not on file  . Years of education: Not on file  . Highest education level: Not on file  Occupational History  . Not on file  Tobacco Use  . Smoking status: Never  Smoker  . Smokeless tobacco: Never Used  Substance and Sexual Activity  . Alcohol use: Yes    Comment: social  . Drug use: No  . Sexual activity: Not on file  Other Topics Concern  . Not on file  Social History Narrative     Married - 3 children      Never Smoked      Alcohol use-yes  (social)      Occupation:  works for city of [place] Resource Strain:   . Difficulty of Paying Living Expenses:   Food Insecurity:   . Worried About Charity fundraiser in the Last Year:   . Arboriculturist in the Last Year:   Transportation Needs:   . Film/video editor (Medical):   Marland Kitchen Lack of Transportation (Non-Medical):   Physical Activity:   . Days of Exercise per Week:   . Minutes of Exercise per Session:   Stress:   . Feeling of Stress :   Social Connections:   . Frequency of Communication with Friends and Family:   . Frequency of Social Gatherings with Friends and Family:   . Attends Religious Services:   . Active Member of Clubs or Organizations:   . Attends Archivist Meetings:   .  Marital Status:   Intimate Partner Violence:   . Fear of Current or Ex-Partner:   . Emotionally Abused:   Marland Kitchen Physically Abused:   . Sexually Abused:     Past Surgical History:  Procedure Laterality Date  . HAND SURGERY     Right    Family History  Problem Relation Age of Onset  . Coronary artery disease Mother        Living  . Diabetes Mother   . Hyperlipidemia Mother   . Hypertension Mother   . Hypertension Father        Living  . Heart disease Father   . Diabetes Maternal Grandmother   . Kidney failure Maternal Grandmother   . Healthy Brother        x1  . Healthy Sister        x2  . Healthy Son        x2    Allergies  Allergen Reactions  . Bee Venom Swelling    Current Outpatient Medications on File Prior to Visit  Medication Sig Dispense Refill  . amLODipine-valsartan (EXFORGE) 10-320 MG tablet TAKE  1 TABLET BY MOUTH DAILY. 30 tablet 5  . levocetirizine (XYZAL) 5 MG tablet Take 1 tablet (5 mg total) by mouth every evening. 30 tablet 2  . ondansetron (ZOFRAN) 4 MG tablet Take 1 tablet (4 mg total) by mouth every 8 (eight) hours as needed. 20 tablet 0  . rosuvastatin (CRESTOR) 20 MG tablet Take 1 tablet (20 mg total) by mouth daily. 90 tablet 1   No current facility-administered medications on file prior to visit.    BP (!) 174/100 (BP Location: Right Arm, Patient Position: Sitting, Cuff Size: Large) Comment: 182/118  Pulse 86   Temp 98.4 F (36.9 C) (Temporal)   Resp 18   Ht 5\' 9"  (1.753 m)   Wt 224 lb (101.6 kg)   SpO2 97%   BMI 33.08 kg/m       Objective:   Physical Exam  General- No acute distress. Pleasant patient. Neck- Full range of motion, no jvd Lungs- Clear, even and unlabored. Heart- regular rate and rhythm. Neurologic- CNII- XII grossly intact. Left shoulder- decreased range of motion. No crepitus. Anterior aspect area is very  tender. Arm not swolen.      Assessment & Plan:  I am referring [facility] to [facility] for your shoulder pain. I do want you to go ahead and call their office tomorrow to expedite your appointment.  I want to minimize the number of days you might miss some work.  Presently use Norco for severe pain.  Stop NSAIDs/ibuprofen as that can increase your blood pressure.  Your blood pressure is very high today but you did report not using your blood pressure medication for 2 days.  Please restart today.  Also as stated above stop any NSAIDs.  Please get over-the-counter blood pressure cuff and start to check your blood pressure daily.  Would like to your blood pressure be closer to 140/90 within about 3 to 4 days.  Follow-up in 7 to 10 days or as needed.  Time spent with patient today was 25  minutes which consisted of chart review, discussing diagnosis, work up, treatment and documentation.

## 2019-07-21 ENCOUNTER — Encounter: Payer: Self-pay | Admitting: Family Medicine

## 2019-07-21 ENCOUNTER — Ambulatory Visit (INDEPENDENT_AMBULATORY_CARE_PROVIDER_SITE_OTHER): Payer: BC Managed Care – PPO | Admitting: Family Medicine

## 2019-07-21 ENCOUNTER — Ambulatory Visit: Payer: Self-pay

## 2019-07-21 ENCOUNTER — Other Ambulatory Visit: Payer: Self-pay

## 2019-07-21 VITALS — BP 167/112 | HR 87 | Ht 69.0 in | Wt 223.0 lb

## 2019-07-21 DIAGNOSIS — M7502 Adhesive capsulitis of left shoulder: Secondary | ICD-10-CM

## 2019-07-21 MED ORDER — TRIAMCINOLONE ACETONIDE 40 MG/ML IJ SUSP
40.0000 mg | Freq: Once | INTRAMUSCULAR | Status: AC
  Administered 2019-07-21: 40 mg via INTRA_ARTICULAR

## 2019-07-21 NOTE — Addendum Note (Signed)
Addended by: Sherrie George F on: 07/21/2019 04:03 PM   Modules accepted: Orders

## 2019-07-21 NOTE — Progress Notes (Signed)
Bruce Harris - 48 y.o. male MRN [id number]  Date of birth: Jul 09, 1970  SUBJECTIVE:  Including CC & ROS.  Chief Complaint  Patient presents with  . Shoulder Pain    left x 1 week    Bruce Harris is a 48 y.o. male that is presenting with left shoulder pain.  The pain is been ongoing for about a week.  Pain can be severe and wake him up at night.  Denies any inciting event.  No history of similar pain.  No surgery.  Has tried ibuprofen for the pain   Review of Systems See HPI   HISTORY: Past Medical, Surgical, Social, and Family History Reviewed & Updated per EMR.   Pertinent Historical Findings include:  Past Medical History:  Diagnosis Date  . Cardiomyopathy   . Chicken pox   . Gout   . Hyperglycemia   . Hyperlipidemia   . Hypertension   . Hypogonadism in male   . OSA (obstructive sleep apnea) 05/19/2017  . Toxic effect of venom of bees     Past Surgical History:  Procedure Laterality Date  . HAND SURGERY     Right    Family History  Problem Relation Age of Onset  . Coronary artery disease Mother        Living  . Diabetes Mother   . Hyperlipidemia Mother   . Hypertension Mother   . Hypertension Father        Living  . Heart disease Father   . Diabetes Maternal Grandmother   . Kidney failure Maternal Grandmother   . Healthy Brother        x1  . Healthy Sister        x2  . Healthy Son        x2    Social History   Socioeconomic History  . Marital status: Married    Spouse name: Not on file  . Number of children: Not on file  . Years of education: Not on file  . Highest education level: Not on file  Occupational History  . Not on file  Tobacco Use  . Smoking status: Never Smoker  . Smokeless tobacco: Never Used  Substance and Sexual Activity  . Alcohol use: Yes    Comment: social  . Drug use: No  . Sexual activity: Not on file  Other Topics Concern  . 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 Difficulty of Paying Living Expenses:   Food Insecurity:   . Worried About Charity fundraiser in the Last Year:   . Arboriculturist in the Last Year:   Transportation Needs:   . Film/video editor (Medical):   Marland Kitchen Lack of Transportation (Non-Medical):   Physical Activity:   . Days of Exercise per Week:   . Minutes of Exercise per Session:   Stress:   . Feeling of Stress :   Social Connections:   . Frequency of Communication with Friends and Family:   . Frequency of Social Gatherings with Friends and Family:   . Attends Religious Services:   . Active Member of Clubs or Organizations:   .  Attends Archivist Meetings:   Marland Kitchen Marital Status:   Intimate Partner Violence:   . Fear of Current or Ex-Partner:   . Emotionally Abused:   Marland Kitchen Physically Abused:   . Sexually Abused:      PHYSICAL EXAM:  VS: BP (!) 167/112   Pulse 87   Ht 5\' 9"  (1.753 m)   Wt 223 lb (101.2 kg)   BMI 32.93 kg/m  Physical Exam Gen: NAD, alert, cooperative with exam, well-appearing MSK:  Left shoulder: Limited external rotation. Limited passive flexion abduction. Normal empty can test. Neurovascularly intact    Aspiration/Injection Procedure Note Bruce Harris [birth year]  Procedure: Injection Indications: Left shoulder pain  Procedure Details Consent: Risks of procedure as well as the alternatives and risks of each were explained to the (patient/caregiver).  Consent for procedure obtained. Time Out: Verified patient identification, verified procedure, site/side was marked, verified correct patient position, special equipment/implants available, medications/allergies/relevent history reviewed, required imaging and test results available.  Performed.  The area was cleaned with iodine and alcohol swabs.    The left glenohumeral  joint was injected using 1 cc's of 40 mg Kenalog, 3 cc of 1% lidocaine and 3 cc's of 0.25% bupivacaine with a 22 3 1/2" needle.  Ultrasound was used. Images were obtained in short views showing the injection.     A sterile dressing was applied.  Patient did tolerate procedure well.    ASSESSMENT & PLAN:   Adhesive capsulitis of left shoulder His shoulder appears to be frozen as a source of his pain.  No effusion observed with ultrasound. -Glenohumeral injection. -Counseled on home exercise therapy and supportive care. -Provided work note. -Could consider physical therapy or Toradol intra-articular injection.

## 2019-07-21 NOTE — Assessment & Plan Note (Signed)
His shoulder appears to be frozen as a source of his pain.  No effusion observed with ultrasound. -Glenohumeral injection. -Counseled on home exercise therapy and supportive care. -Provided work note. -Could consider physical therapy or Toradol intra-articular injection.

## 2019-07-21 NOTE — Patient Instructions (Signed)
Nice to meet you Please try heat before the exercises and ice after  Please try the exercises on a regular basis   Please send me a message in [place] with any questions or updates.  Please see me back in 4 weeks.   --Dr. Raeford Razor

## 2019-07-28 DIAGNOSIS — G4733 Obstructive sleep apnea (adult) (pediatric): Secondary | ICD-10-CM | POA: Diagnosis not present

## 2019-08-20 ENCOUNTER — Ambulatory Visit: Payer: BC Managed Care – PPO | Admitting: Family Medicine

## 2019-08-20 NOTE — Progress Notes (Deleted)
Bruce Harris - 49 y.o. male MRN [id number]  Date of birth: 09-09-1970  SUBJECTIVE:  Including CC & ROS.  No chief complaint on file.   Bruce Harris is a 49 y.o. male that is  ***.  ***   Review of Systems See HPI   HISTORY: Past Medical, Surgical, Social, and Family History Reviewed & Updated per EMR.   Pertinent Historical Findings include:  Past Medical History:  Diagnosis Date   Cardiomyopathy    Chicken pox    Gout    Hyperglycemia    Hyperlipidemia    Hypertension    Hypogonadism in male    OSA (obstructive sleep apnea) 05/19/2017   Toxic effect of venom of bees     Past Surgical History:  Procedure Laterality Date   HAND SURGERY     Right    Family History  Problem Relation Age of Onset   Coronary artery disease Mother        Living   Diabetes Mother    Hyperlipidemia Mother    Hypertension Mother    Hypertension Father        Living   Heart disease Father    Diabetes Maternal Grandmother    Kidney failure Maternal Grandmother    Healthy Brother        x1   Healthy Sister        x2   Healthy Son        x2    Social History   Socioeconomic History   Marital status: Married    Spouse name: Not on file   Number of children: Not on file   Years of education: Not on file   Highest education level: Not on file  Occupational History   Not on file  Tobacco Use   Smoking status: Never Smoker   Smokeless tobacco: Never Used  Scientific laboratory technician Use: Never used  Substance and Sexual Activity   Alcohol use: Yes    Comment: social   Drug use: No   Sexual activity: Not on file  Other Topics Concern   Not on file  Social History Narrative     Married - 3 children      Never Smoked      Alcohol use-yes  (social)      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 Strain:    Difficulty of Paying Living Expenses:   Food Insecurity:     Worried About Charity fundraiser in the Last Year:    Arboriculturist in the Last Year:   Transportation Needs:    Film/video editor (Medical):    Lack of Transportation (Non-Medical):   Physical Activity:    Days of Exercise per Week:    Minutes of Exercise per Session:   Stress:    Feeling of Stress :   Social Connections:    Frequency of Communication with Friends and Family:    Frequency of Social Gatherings with Friends and Family:    Attends Religious Services:    Active Member of Clubs or Organizations:    Attends Music therapist:    Marital Status:   Intimate Partner Violence:    Fear of Current or Ex-Partner:    Emotionally Abused:    Physically Abused:    Sexually Abused:      PHYSICAL EXAM:  VS: There were no vitals taken for this visit. Physical Exam Gen: NAD, alert, cooperative with exam, well-appearing MSK:  ***      ASSESSMENT & PLAN:   No problem-specific Assessment & Plan notes found for this encounter.

## 2019-08-23 ENCOUNTER — Telehealth (INDEPENDENT_AMBULATORY_CARE_PROVIDER_SITE_OTHER): Payer: BC Managed Care – PPO | Admitting: Family Medicine

## 2019-08-23 ENCOUNTER — Encounter: Payer: Self-pay | Admitting: Family Medicine

## 2019-08-23 VITALS — BP 135/93

## 2019-08-23 DIAGNOSIS — J069 Acute upper respiratory infection, unspecified: Secondary | ICD-10-CM

## 2019-08-23 MED ORDER — PREDNISONE 20 MG PO TABS: 40.0000 mg | ORAL_TABLET | Freq: Every day | ORAL | 0 refills | Status: AC

## 2019-08-23 NOTE — Progress Notes (Signed)
Chief Complaint  Patient presents with  . Headache  . Shortness of Breath  . Generalized Body Aches  . Fatigue    Bruce Harris here for URI complaints. Due to COVID-19 pandemic, we are interacting via web portal for an electronic face-to-face visit. I verified patient's ID using 2 identifiers. Patient agreed to proceed with visit via this method. Patient is in a parked car, I am at office. Patient and I are present for visit.   Duration: 1 week  Associated symptoms: sinus congestion, shortness of breath, chest tightness, myalgia and fatigue Denies: sinus pain, rhinorrhea, itchy watery eyes, ear pain, ear drainage, sore throat, wheezing and fevers Treatment to date: Coricidin, Tylenol, ibuprofen  Sick contacts: No  Past Medical History:  Diagnosis Date  . Cardiomyopathy   . Chicken pox   . Gout   . Hyperglycemia   . Hyperlipidemia   . Hypertension   . Hypogonadism in male   . OSA (obstructive sleep apnea) 05/19/2017  . Toxic effect of venom of bees     BP (!) 135/93  No conversational dyspnea Age appropriate judgment and insight Nml affect and mood  URI, acute - Plan: predniSONE (DELTASONE) 20 MG tablet  Pred burst. If no improvement in next 2-3 d, he'll send a message and we will eval him person.  Continue to push fluids, practice good hand hygiene, cover mouth when coughing. F/u prn. If starting to experience fevers, shaking, or shortness of breath, seek immediate care. Pt voiced understanding and agreement to the plan.  [place], DO 08/23/19 1:40 PM

## 2019-09-01 ENCOUNTER — Telehealth: Payer: Self-pay | Admitting: Family Medicine

## 2019-09-01 MED ORDER — EPINEPHRINE 0.3 MG/0.3ML IJ SOAJ: 0.3000 mg | INTRAMUSCULAR | 3 refills | Status: AC | PRN

## 2019-09-01 NOTE — Telephone Encounter (Signed)
Sent in

## 2019-09-01 NOTE — Telephone Encounter (Signed)
Medication: EPINEPHrine (EPIPEN) 0.3 mg/0.3 mL DEVI [[id number]     Has the patient contacted their pharmacy?  (If no, request that the patient contact the pharmacy for the refill.) (If yes, when and what did the pharmacy advise?)     Preferred Pharmacy (with phone number or street name): [facility] [phone] [place], [place] [phone] [facility] [address] AT [facility] [address]  [address] [place] [address]  Phone:  [phone] Fax:  [phone]      Agent: Please be advised that RX refills may take up to 3 business days. We ask that you follow-up with your pharmacy.

## 2019-10-16 ENCOUNTER — Telehealth (INDEPENDENT_AMBULATORY_CARE_PROVIDER_SITE_OTHER): Payer: BC Managed Care – PPO | Admitting: Family Medicine

## 2019-10-16 ENCOUNTER — Encounter: Payer: Self-pay | Admitting: Family Medicine

## 2019-10-16 ENCOUNTER — Other Ambulatory Visit: Payer: Self-pay

## 2019-10-16 VITALS — Ht 69.0 in | Wt 223.0 lb

## 2019-10-16 DIAGNOSIS — R059 Cough, unspecified: Secondary | ICD-10-CM

## 2019-10-16 DIAGNOSIS — R05 Cough: Secondary | ICD-10-CM

## 2019-10-16 MED ORDER — AZELASTINE HCL 0.1 % NA SOLN
2.0000 | Freq: Two times a day (BID) | NASAL | 12 refills | Status: AC
Start: 2019-10-16 — End: ?

## 2019-10-16 MED ORDER — AZITHROMYCIN 250 MG PO TABS: ORAL_TABLET | ORAL | 0 refills | Status: DC

## 2019-10-16 MED ORDER — BENZONATATE 200 MG PO CAPS
200.0000 mg | ORAL_CAPSULE | Freq: Two times a day (BID) | ORAL | 0 refills | Status: DC | PRN
Start: 2019-10-16 — End: 2020-06-06

## 2019-10-16 NOTE — Progress Notes (Signed)
   Bruce Harris is a 49 y.o. male who presents today for a virtual office visit.  Assessment/Plan:  Cough Consistent with viral URI.  Has no red flags.  Patient has been vaccinated with Bruce Harris vaccine though will be going to get Covid tested within the next couple of days.  We will give work note to stay home until test result comes back.  He may be candidate for monoclonal antibody infusion clinic due to his comorbidities should his test result come back positive.  We will also treat symptoms with Tessalon and Astelin nasal spray.  We will also send in "pocket prescription" for azithromycin with instruction to not start unless symptoms worsen over the next several days.  Discussed reasons to return to care.    Subjective:  HPI:  Patient presents today for virtual visit with constellation of symptoms including fever, rhinorrhea, sore throat, cough, body aches.  Symptoms started about a week ago. Bodyaches and coughing over the last few days. Taking ibuprofen and tylenol which seems to help modestly.  Received [employer] vaccine a few months ago.  No known sick contacts.       Objective/Observations  Physical Exam: Gen: NAD, resting comfortably Pulm: Normal work of breathing Neuro: Grossly normal, moves all extremities Psych: Normal affect and thought content  Virtual Visit via Video   I connected with Bruce Harris on 10/16/19 at 10:20 AM EDT by a video enabled telemedicine application and verified that I am speaking with the correct person using two identifiers. The limitations of evaluation and management by telemedicine and the availability of in person appointments were discussed. The patient expressed understanding and agreed to proceed.   Patient location: Home Provider location: [place] participating in the virtual visit: Myself and Patient     Bruce Harris. Jerline Pain, MD 10/16/2019 9:56 AM

## 2019-10-18 ENCOUNTER — Telehealth: Payer: Self-pay | Admitting: Family Medicine

## 2019-10-18 NOTE — Telephone Encounter (Signed)
Patient will call after tested to see if additional work note is needed

## 2019-10-18 NOTE — Telephone Encounter (Signed)
Nurse Assessment Nurse: Jerene Bears, RN, Will Date/Time Eilene Ghazi Time): 10/16/2019 12:46:36 PM Confirm and document reason for call. If symptomatic, describe symptoms. ---Caller reports that he was seen via virtual visit by Dr. Dimas Chyle this morning and a work note was sent via mychart. Per caller, work note does not accurately reflect the time off of work that he discussed with the provider. Has the patient had close contact with a person known or suspected to have the novel coronavirus illness OR traveled / lives in area with major community spread (including international travel) in the last 14 days from the onset of symptoms? * If Asymptomatic, screen for exposure and travel within the last 14 days. ---No Does the patient have any new or worsening symptoms? ---No Disp. Time Eilene Ghazi Time) Disposition Final User 10/16/2019 12:08:26 PM Attempt made - no message left Laurann Montana, RN, Fransico Meadow 10/16/2019 12:18:20 PM FINAL ATTEMPT MADE - no message left Vira Agar 10/16/2019 12:18:25 PM Send to RN Final Attempt Mamie Laurel, RN, Fransico Meadow 10/16/2019 12:38:38 PM Send To [place], RN, Windy 10/16/2019 12:51:25 PM Send To RN Personal Jerene Bears, RN, Will 10/16/2019 1:10:43 PM Called On-Call Provider [place], RN, Will 10/16/2019 12:50:38 PM Clinical Call Yes Jerene Bears, RN, WillPLEASE NOTE: All timestamps contained within this report are represented as [place] Standard Time. CONFIDENTIALTY NOTICE: This fax transmission is intended only for the addressee. It contains information that is legally privileged, confidential or otherwise protected from use or disclosure. If you are not the intended recipient, you are strictly prohibited from reviewing, disclosing, copying using or disseminating any of this information or taking any action in reliance on or regarding this information. If you have received this fax in error, please notify us immediately by telephone so that we  can arrange for its return to [name]. Phone: [phone], Toll-Free: [phone], Fax: [phone] Page: 2 of 2 Call Id: [id number] Paging DoctorName Phone DateTime Result/Outcome Message Type Notes Dimas Chyle- MD [phone] 10/16/2019 1:10:43 PM Called On Call Provider - Reached Doctor Paged Dimas Chyle- MD 10/16/2019 1:11:05 PM Spoke with On Call - General Message Result provider will review work note and adjust accordingl

## 2019-10-19 ENCOUNTER — Other Ambulatory Visit: Payer: BC Managed Care – PPO

## 2019-10-19 ENCOUNTER — Other Ambulatory Visit: Payer: Self-pay

## 2019-10-19 DIAGNOSIS — Z20822 Contact with and (suspected) exposure to covid-19: Secondary | ICD-10-CM | POA: Diagnosis not present

## 2019-10-20 LAB — SARS-COV-2, NAA 2 DAY TAT

## 2019-10-20 LAB — NOVEL CORONAVIRUS, NAA: SARS-CoV-2, NAA: DETECTED — AB

## 2019-10-21 ENCOUNTER — Telehealth: Payer: Self-pay | Admitting: Infectious Diseases

## 2019-10-21 NOTE — Telephone Encounter (Signed)
Called to Discuss with patient about Covid symptoms and the use of the monoclonal antibody infusion for those with mild to moderate Covid symptoms and at a high risk of hospitalization.     Pt appears to qualify for this infusion due to co-morbid conditions and/or a member of an at-risk group in accordance with the FDA Emergency Use Authorization.    Unable to reach pt via phone. MyChart message sent.   Will reach out to PCP to see if he can facilitate discussion as well to get him scheduled as long as he is < 10 days of symptoms

## 2019-10-21 NOTE — Telephone Encounter (Signed)
Plz see if interested. Ty.

## 2019-10-22 NOTE — Telephone Encounter (Signed)
Spoke with patient made him aware of the infusion option -- he stated he is doing okay , and drinking lots of fluids and that a mychart message was sent with information on the infusion. Patient states he would look at the [place] message and links and message Bruce [place] Dixon,NP or give [name] a call back on his decision on whether or not he would be interested in the infusion.

## 2019-10-26 ENCOUNTER — Other Ambulatory Visit: Payer: Self-pay | Admitting: *Deleted

## 2019-10-26 ENCOUNTER — Other Ambulatory Visit: Payer: Self-pay

## 2019-10-26 ENCOUNTER — Other Ambulatory Visit: Payer: BC Managed Care – PPO

## 2019-10-26 DIAGNOSIS — Z20822 Contact with and (suspected) exposure to covid-19: Secondary | ICD-10-CM | POA: Diagnosis not present

## 2019-10-27 LAB — SARS-COV-2, NAA 2 DAY TAT

## 2019-10-27 LAB — NOVEL CORONAVIRUS, NAA: SARS-CoV-2, NAA: NOT DETECTED

## 2019-10-29 ENCOUNTER — Encounter: Payer: Self-pay | Admitting: Family Medicine

## 2019-10-29 ENCOUNTER — Telehealth: Payer: Self-pay | Admitting: Family Medicine

## 2019-10-29 NOTE — Telephone Encounter (Signed)
Letter completed///sent through [employer].

## 2019-10-29 NOTE — Telephone Encounter (Signed)
Reviewed Dr. Marigene Ehlers note. OK for letter. Ty.

## 2019-10-29 NOTE — Telephone Encounter (Signed)
Pt states needs a note to return back to work. Pt states negative covid test.   Please advise   Patient wishes to note sent to [facility]

## 2019-11-03 ENCOUNTER — Telehealth: Payer: Self-pay | Admitting: Family Medicine

## 2019-11-03 DIAGNOSIS — E785 Hyperlipidemia, unspecified: Secondary | ICD-10-CM

## 2019-11-03 MED ORDER — ROSUVASTATIN CALCIUM 20 MG PO TABS: 20.0000 mg | ORAL_TABLET | Freq: Every day | ORAL | 0 refills | Status: DC

## 2019-11-03 MED ORDER — AMLODIPINE BESYLATE-VALSARTAN 10-320 MG PO TABS
1.0000 | ORAL_TABLET | Freq: Every day | ORAL | 5 refills | Status: DC
Start: 2019-11-03 — End: 2020-03-13

## 2019-11-03 NOTE — Telephone Encounter (Signed)
Medication:   amLODipine-valsartan (EXFORGE) 10-320 MG tablet  rosuvastatin (CRESTOR) 20 MG tablet    Has the patient contacted their pharmacy? No. (If no, request that the patient contact the pharmacy for the refill.) (If yes, when and what did the pharmacy advise?)  Preferred Pharmacy (with phone number or street name) [facility] [phone] [place], [place] - [address] Phone:  [phone]  Fax:  [phone]      Agent: Please be advised that RX refills may take up to 3 business days. We ask that you follow-up with your pharmacy.

## 2019-11-03 NOTE — Telephone Encounter (Signed)
Refill done as requested 

## 2019-11-26 MED FILL — AMLODIPINE-VALSARTAN 10-320: 10-320 | 30 days supply | Qty: 30 | Fill #2

## 2019-11-30 DIAGNOSIS — G4733 Obstructive sleep apnea (adult) (pediatric): Secondary | ICD-10-CM | POA: Diagnosis not present

## 2020-03-10 ENCOUNTER — Other Ambulatory Visit: Payer: Self-pay | Admitting: Family Medicine

## 2020-03-13 ENCOUNTER — Other Ambulatory Visit: Payer: Self-pay | Admitting: Family Medicine

## 2020-03-13 MED FILL — AMLODIPINE-VALSARTAN 10-320: 10-320 | 30 days supply | Qty: 30 | Fill #0

## 2020-04-06 ENCOUNTER — Telehealth: Payer: Self-pay

## 2020-04-06 NOTE — Telephone Encounter (Signed)
Called pt and lvm to return call .    [phone] -- sleep study number

## 2020-04-06 NOTE — Telephone Encounter (Signed)
Caller states he would need the number to his sleep study number.  Telephone: [phone]

## 2020-04-07 NOTE — Telephone Encounter (Signed)
Called the patient informed of sleep study number and address of office.

## 2020-04-17 ENCOUNTER — Encounter: Payer: Self-pay | Admitting: Primary Care

## 2020-04-17 ENCOUNTER — Other Ambulatory Visit: Payer: Self-pay

## 2020-04-17 ENCOUNTER — Ambulatory Visit (INDEPENDENT_AMBULATORY_CARE_PROVIDER_SITE_OTHER): Payer: Self-pay | Admitting: Primary Care

## 2020-04-17 VITALS — BP 128/80 | HR 85 | Temp 98.3°F | Ht 69.0 in | Wt 239.2 lb

## 2020-04-17 DIAGNOSIS — G4733 Obstructive sleep apnea (adult) (pediatric): Secondary | ICD-10-CM

## 2020-04-17 NOTE — Patient Instructions (Signed)
Continue to work on maintaining healthy weight Recommend side sleeping position Avoid excessive alcohol or sedating medication prior to bedtime Do not drive if experiencing excessive daytime fatigue or somnolence   Orders: Home sleep study re: sleep apnea  Follow-up: 6 weeks to review sleep study (televisit ok)   Sleep Apnea Sleep apnea affects breathing during sleep. It causes breathing to stop for a short time or to become shallow. It can also increase the risk of:  Heart attack.  Stroke.  Being very overweight (obese).  Diabetes.  Heart failure.  Irregular heartbeat. The goal of treatment is to help you breathe normally again. What are the causes? There are three kinds of sleep apnea:  Obstructive sleep apnea. This is caused by a blocked or collapsed airway.  Central sleep apnea. This happens when the brain does not send the right signals to the muscles that control breathing.  Mixed sleep apnea. This is a combination of obstructive and central sleep apnea. The most common cause of this condition is a collapsed or blocked airway. This can happen if:  Your throat muscles are too relaxed.  Your tongue and tonsils are too large.  You are overweight.  Your airway is too small.   What increases the risk?  Being overweight.  Smoking.  Having a small airway.  Being older.  Being male.  Drinking alcohol.  Taking medicines to calm yourself (sedatives or tranquilizers).  Having family members with the condition. What are the signs or symptoms?  Trouble staying asleep.  Being sleepy or tired during the day.  Getting angry a lot.  Loud snoring.  Headaches in the morning.  Not being able to focus your mind (concentrate).  Forgetting things.  Less interest in sex.  Mood swings.  Personality changes.  Feelings of sadness (depression).  Waking up a lot during the night to pee (urinate).  Dry mouth.  Sore throat. How is this  diagnosed?  Your medical history.  A physical exam.  A test that is done when you are sleeping (sleep study). The test is most often done in a sleep lab but may also be done at home. How is this treated?  Sleeping on your side.  Using a medicine to get rid of mucus in your nose (decongestant).  Avoiding the use of alcohol, medicines to help you relax, or certain pain medicines (narcotics).  Losing weight, if needed.  Changing your diet.  Not smoking.  Using a machine to open your airway while you sleep, such as: ? An oral appliance. This is a mouthpiece that shifts your lower jaw forward. ? A CPAP device. This device blows air through a mask when you breathe out (exhale). ? An EPAP device. This has valves that you put in each nostril. ? A BPAP device. This device blows air through a mask when you breathe in (inhale) and breathe out.  Having surgery if other treatments do not work. It is important to get treatment for sleep apnea. Without treatment, it can lead to:  High blood pressure.  Coronary artery disease.  In men, not being able to have an erection (impotence).  Reduced thinking ability.   Follow these instructions at home: Lifestyle  Make changes that your doctor recommends.  Eat a healthy diet.  Lose weight if needed.  Avoid alcohol, medicines to help you relax, and some pain medicines.  Do not use any products that contain nicotine or tobacco, such as cigarettes, e-cigarettes, and chewing tobacco. If you need help quitting,  ask your doctor. General instructions  Take over-the-counter and prescription medicines only as told by your doctor.  If you were given a machine to use while you sleep, use it only as told by your doctor.  If you are having surgery, make sure to tell your doctor you have sleep apnea. You may need to bring your device with you.  Keep all follow-up visits as told by your doctor. This is important. Contact a doctor if:  The  machine that you were given to use during sleep bothers you or does not seem to be working.  You do not get better.  You get worse. Get help right away if:  Your chest hurts.  You have trouble breathing in enough air.  You have an uncomfortable feeling in your back, arms, or stomach.  You have trouble talking.  One side of your body feels weak.  A part of your face is hanging down. These symptoms may be an emergency. Do not wait to see if the symptoms will go away. Get medical help right away. Call your local emergency services (911 in the [place] Do not drive yourself to the hospital. Summary  This condition affects breathing during sleep.  The most common cause is a collapsed or blocked airway.  The goal of treatment is to help you breathe normally while you sleep. This information is not intended to replace advice given to you by your health care provider. Make sure you discuss any questions you have with your health care provider. Document Revised: 12/05/2017 Document Reviewed: 10/14/2017 Elsevier Patient Education  [employer].

## 2020-04-17 NOTE — Assessment & Plan Note (Addendum)
-   Poor compliance with CPAP, previously on auto CPAP 5-15cm h20. Sleeping well on nights he does not wear. Needs repeat HST to re-evaluate for OSA - Encourage patient maintain healthy weight and side sleeping position. Advised he avoid excessive alcohol/sedating medication prior to bedtime and not drive if experiencing excessive daytime fatigue  Orders: Home sleep study re: sleep apnea  Follow-up: 6 weeks to review sleep study (televisit ok)

## 2020-04-17 NOTE — Progress Notes (Signed)
@Patient  ID: Bruce Harris, male    DOB: [birth year], 50 y.o.   MRN: [id number]  Chief Complaint  Patient presents with  . Follow-up    Pt states he has been doing good since last visit and denies any complaints. Pt states that he has not worn his CPAP in a while and that is the reason for today's visit. Pt states when he wakes up in the morning, he states that he does feel rested.    Referring provider: Shelda Pal*  HPI: 50 year old male, never smoked.  Medical history significant for obstructive sleep apnea, hypertension, cardiomyopathy, obesity.  Patient of Dr. Halford Chessman, last seen in office on 08/11/2017. HST 05/18/17 >> AHI 16.6, SaO2 low 68%  04/17/2020 Patient presents today for an overdue follow-up for OSA.  Last available data of CPAP use was back in February 2021. He owns his CPAP machine. His weigh has stayed the same. His energy level has improved and he is not tired like he used to. He still occasionally snores. He does not wake up at night. No witness apnea. No daytime fatigue or somnolence. 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 He has not worn CPAP in over 1-2 years. Epworth 1/24.   Airview download 04/04/19-05/03/2019 12/30 days (40%); 8 days, 27%) greater than 4 hours Average usage days used 4 hours 9 minutes Pressure 5-15 cm H2O (11.8 cm H2O/95%) Air leak 21.9 L/min (95%) AHI 0.5  Allergies  Allergen Reactions  . Bee Venom Swelling    Immunization History  Administered Date(s) Administered  . Influenza Whole 12/30/2005, 12/10/2007  . Influenza,inj,Quad PF,6+ Mos 02/02/2020  . Janssen (J&J) SARS-COV-2 Vaccination 07/13/2019  . Td 09/10/2004  . Tdap 12/12/2017    Past Medical History:  Diagnosis Date  . Cardiomyopathy   . Chicken pox   . Gout   . Hyperglycemia   . Hyperlipidemia   . Hypertension   . Hypogonadism in male   . OSA (obstructive sleep apnea) 05/19/2017  . Toxic effect of venom  of bees     Tobacco History: Social History   Tobacco Use  Smoking Status Never Smoker  Smokeless Tobacco Never Used   Counseling given: Not Answered   Outpatient Medications Prior to Visit  Medication Sig Dispense Refill  . amLODipine-valsartan (EXFORGE) 10-320 MG tablet TAKE 1 TABLET BY MOUTH DAILY. 30 tablet 5  . azelastine (ASTELIN) 0.1 % nasal spray Place 2 sprays into both nostrils 2 (two) times daily. 30 mL 12  . benzonatate (TESSALON) 200 MG capsule Take 1 capsule (200 mg total) by mouth 2 (two) times daily as needed for cough. 20 capsule 0  . EPINEPHrine (EPIPEN 2-PAK) 0.3 mg/0.3 mL IJ SOAJ injection Inject 0.3 mLs (0.3 mg total) into the muscle as needed for anaphylaxis. 1 each 3  . HYDROcodone-acetaminophen (NORCO) 5-325 MG tablet Take 1 tablet by mouth every 6 (six) hours as needed for moderate pain. 30 tablet 0  . levocetirizine (XYZAL) 5 MG tablet Take 1 tablet (5 mg total) by mouth every evening. 30 tablet 2  . rosuvastatin (CRESTOR) 20 MG tablet Take 1 tablet (20 mg total) by mouth daily. 90 tablet 0  . azithromycin (ZITHROMAX) 250 MG tablet Take 2 tabs day 1, then 1 tab daily 6 each 0   No facility-administered medications prior to visit.    Review of Systems  Review of Systems  Constitutional: Negative.   Respiratory: Negative.  Psychiatric/Behavioral: Negative for sleep disturbance.     Physical Exam  BP 128/80 (BP Location: Left Arm, Cuff Size: Large)   Pulse 85   Temp 98.3 F (36.8 C) (Temporal)   Ht 5\' 9"  (1.753 m)   Wt 239 lb 3.2 oz (108.5 kg)   SpO2 97%   BMI 35.32 kg/m  Physical Exam Constitutional:      Appearance: Normal appearance.  HENT:     Head: Normocephalic and atraumatic.     Mouth/Throat:     Mouth: Mucous membranes are moist.     Pharynx: Oropharynx is clear.  Cardiovascular:     Rate and Rhythm: Normal rate and regular rhythm.  Pulmonary:     Effort: Pulmonary effort is normal.     Breath sounds: Normal breath sounds.   Musculoskeletal:        General: Normal range of motion.  Skin:    General: Skin is warm and dry.  Neurological:     General: No focal deficit present.     Mental Status: He is alert and oriented to person, place, and time. Mental status is at baseline.  Psychiatric:        Mood and Affect: Mood normal.        Behavior: Behavior normal.        Thought Content: Thought content normal.        Judgment: Judgment normal.      Lab Results:  CBC    Component Value Date/Time   WBC 7.2 01/20/2019 1555   RBC 5.24 01/20/2019 1555   HGB 13.5 01/20/2019 1555   HCT 42.6 01/20/2019 1555   PLT 264.0 01/20/2019 1555   MCV 81.2 01/20/2019 1555   MCH 26.2 10/13/2013 0948   MCHC 31.8 01/20/2019 1555   RDW 15.1 01/20/2019 1555   LYMPHSABS 2.0 10/18/2016 1010   MONOABS 0.4 10/18/2016 1010   EOSABS 0.2 10/18/2016 1010   BASOSABS 0.0 10/18/2016 1010    BMET    Component Value Date/Time   NA 142 01/20/2019 1555   K 4.0 01/20/2019 1555   CL 107 01/20/2019 1555   CO2 26 01/20/2019 1555   GLUCOSE 92 01/20/2019 1555   BUN 19 01/20/2019 1555   CREATININE 1.52 (H) 01/20/2019 1555   CREATININE 1.32 10/13/2013 0948   CALCIUM 9.2 01/20/2019 1555   GFRNONAA 66 10/13/2013 0948   GFRAA 76 10/13/2013 0948    BNP No results found for: BNP  ProBNP No results found for: PROBNP  Imaging: No results found.   Assessment & Plan:   OSA (obstructive sleep apnea) - Poor compliance with CPAP, previously on auto CPAP 5-15cm h20. Sleeping well on nights he does not wear. Needs repeat HST to re-evaluate for OSA - Encourage patient maintain healthy weight and side sleeping position. Advised he avoid excessive alcohol/sedating medication prior to bedtime and not drive if experiencing excessive daytime fatigue  Orders: Home sleep study re: sleep apnea  Follow-up: 6 weeks to review sleep study (televisit ok)   Martyn Ehrich, NP 04/17/2020

## 2020-04-19 NOTE — Progress Notes (Signed)
Reviewed and agree with assessment/plan.   Keefer Soulliere, MD [facility] 04/19/2020, 11:01 AM Pager:  [phone]  

## 2020-05-18 ENCOUNTER — Other Ambulatory Visit: Payer: Self-pay

## 2020-05-29 ENCOUNTER — Other Ambulatory Visit: Payer: Self-pay

## 2020-05-29 ENCOUNTER — Ambulatory Visit (INDEPENDENT_AMBULATORY_CARE_PROVIDER_SITE_OTHER): Payer: Self-pay | Admitting: Primary Care

## 2020-05-29 DIAGNOSIS — G4730 Sleep apnea, unspecified: Secondary | ICD-10-CM

## 2020-05-29 NOTE — Patient Instructions (Signed)
Called patient, no answer- left message Need to reschedule appointment after completing repeat sleep study

## 2020-05-29 NOTE — Progress Notes (Signed)
Virtual Visit via Telephone Note  I connected with KORT STETTLER on 05/29/20 at  4:30 PM EDT by telephone and verified that I am speaking with the correct person using two identifiers.  Location: Patient: Home Provider: Office    I discussed the limitations, risks, security and privacy concerns of performing an evaluation and management service by telephone and the availability of in person appointments. I also discussed with the patient that there may be a patient responsible charge related to this service. The patient expressed understanding and agreed to proceed.   History of Present Illness: 50 year old male, never smoked.  Medical history significant for obstructive sleep apnea, hypertension, cardiomyopathy, obesity.  Patient of Dr. Halford Chessman, last seen in office on 08/11/2017. HST 05/18/17 >> AHI 16.6, SaO2 low 68%  Previous [facility] pulmonary encounter:  04/17/2020 Patient presents today for an overdue follow-up for OSA.  Last available data of CPAP use was back in February 2021. He owns his CPAP machine. His weigh has stayed the same. His energy level has improved and he is not tired like he used to. He still occasionally snores. He does not wake up at night. No witness apnea. No daytime fatigue or somnolence. 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 He has not worn CPAP consistently over 1-2 years. Epworth 1/24.   Airview download 04/04/19-05/03/2019: 12/30 days (40%); 8 days, 27%) greater than 4 hours. Average usage days used 4 hours 9 minutes. Pressure 5-15 cm H2O (11.8 cm H2O/95%). Air leak 21.9 L/min (95%). AHI 0.5  05/29/2020 - Interim  Patient contacted today for follow-up televisit to review sleep study results.  No answer, left message. Does not appear he has had HST. Patient will need to reschedule.    Allergies  Allergen Reactions      Observations/Objective:   Assessment and Plan:   Follow Up Instructions:    I  discussed the assessment and treatment plan with the patient. The patient was provided an opportunity to ask questions and all were answered. The patient agreed with the plan and demonstrated an understanding of the instructions.   The patient was advised to call back or seek an in-person evaluation if the symptoms worsen or if the condition fails to improve as anticipated.  I provided 000 minutes of non-face-to-face time during this encounter.   Martyn Ehrich, NP

## 2020-06-06 ENCOUNTER — Telehealth: Payer: Self-pay

## 2020-06-06 ENCOUNTER — Encounter: Payer: Self-pay | Admitting: Family Medicine

## 2020-06-06 ENCOUNTER — Telehealth (INDEPENDENT_AMBULATORY_CARE_PROVIDER_SITE_OTHER): Payer: 59 | Admitting: Family Medicine

## 2020-06-06 ENCOUNTER — Telehealth: Payer: Self-pay | Admitting: Family Medicine

## 2020-06-06 ENCOUNTER — Other Ambulatory Visit: Payer: Self-pay

## 2020-06-06 DIAGNOSIS — R059 Cough, unspecified: Secondary | ICD-10-CM

## 2020-06-06 DIAGNOSIS — J0100 Acute maxillary sinusitis, unspecified: Secondary | ICD-10-CM

## 2020-06-06 MED ORDER — PREDNISONE 20 MG PO TABS: 40.0000 mg | ORAL_TABLET | Freq: Every day | ORAL | 0 refills | Status: AC

## 2020-06-06 MED ORDER — BENZONATATE 100 MG PO CAPS: 100.0000 mg | ORAL_CAPSULE | Freq: Three times a day (TID) | ORAL | 0 refills | Status: DC | PRN

## 2020-06-06 NOTE — Telephone Encounter (Signed)
Patient has made appt today 06/06/20 with PCP

## 2020-06-06 NOTE — Telephone Encounter (Signed)
Letter stating he was seen today and to return Th or sooner if he is feeling better. Ty.

## 2020-06-06 NOTE — Telephone Encounter (Signed)
CallerDonterius Harris  Call Back @ [phone]  Patient seen this morning virtually by Dr Nani Ravens, patient states doctor took him out of work until Thrusday, patient states he will need a note and uploaded to [facility] please

## 2020-06-06 NOTE — Telephone Encounter (Signed)
Called left message to call back 

## 2020-06-06 NOTE — Telephone Encounter (Signed)
Note sent and patient notified

## 2020-06-06 NOTE — Telephone Encounter (Signed)
Nurse Assessment Nurse: Fredderick Phenix, RN, Lelan Pons Date/Time Eilene Ghazi Time): 06/05/2020 5:19:15 PM Confirm and document reason for call. If symptomatic, describe symptoms. ---Caller needs to schedule an appointment, no need for triage or advice. Does the patient have any new or worsening symptoms? ---No Disp. Time Eilene Ghazi  Time) Disposition Final User 06/05/2020 5:19:54 PM Attempt made - message left Stormy Card 06/05/2020 5:20:06 PM Clinical Call Yes Fredderick Phenix, RN, Lelan Pons

## 2020-06-06 NOTE — Progress Notes (Signed)
Chief Complaint  Patient presents with  . Cough  . Sinusitis  . Generalized Body Aches    Bruce Harris here for URI complaints. Due to COVID-19 pandemic, we are interacting via web portal for an electronic face-to-face visit. I verified patient's ID using 2 identifiers. Patient agreed to proceed with visit via this method. Patient is at home, I am at office. Patient and I are present for visit.   Duration: 4 day  Associated symptoms: subjective fever, sinus headache, sinus congestion, sinus pain, rhinorrhea, itchy watery eyes, chest tightness, myalgia and cough, nausea Denies: ear pain, ear drainage, sore throat, wheezing and shortness of breath Treatment to date: Tessalon Perles, push fluids, otc sinus meds Sick contacts: No  Past Medical History:  Diagnosis Date  . Cardiomyopathy   . Chicken pox   . Gout   . Hyperglycemia   . Hyperlipidemia   . Hypertension   . Hypogonadism in male   . OSA (obstructive sleep apnea) 05/19/2017  . Toxic effect of venom of bees    Objective No conversational dyspnea Age appropriate judgment and insight Nml affect and mood  Acute maxillary sinusitis, recurrence not specified - Plan: predniSONE (DELTASONE) 20 MG tablet  Cough - Plan: benzonatate (TESSALON) 100 MG capsule  5 d pred burst, 40 mg/d, likely due to allergies. Tessalon Perles for symp management.  Continue to push fluids, practice good hand hygiene, cover mouth when coughing. F/u prn. If starting to experience fevers, shaking, or shortness of breath, seek immediate care. Pt voiced understanding and agreement to the plan.  Katherine, DO 06/06/20 8:34 AM

## 2020-06-06 NOTE — Telephone Encounter (Signed)
Will do let me know beginning and ending dates

## 2020-08-22 IMAGING — CT CT ABD-PELV W/ CM
2 of 5 series · 17 of 46 positions shown, 19 images · IV contrast (OMNIPAQUE 300)
Comparison: None.

CLINICAL DATA: Generalized abdominal pain, nausea and vomiting, and
bloating for several months.

EXAM:
CT ABDOMEN AND PELVIS WITH CONTRAST
TECHNIQUE: Multidetector CT imaging of the abdomen and pelvis was performed
using the standard protocol following bolus administration of
intravenous contrast.
CONTRAST:  100mL OMNIPAQUE IOHEXOL 300 MG/ML  SOLN

[Series 2: abd/pel w · axial · 0.78mm/px · z∈[-651,-226]mm · 14 of 95 slices shown, 16 images]
[im 5/95  soft-tissue]
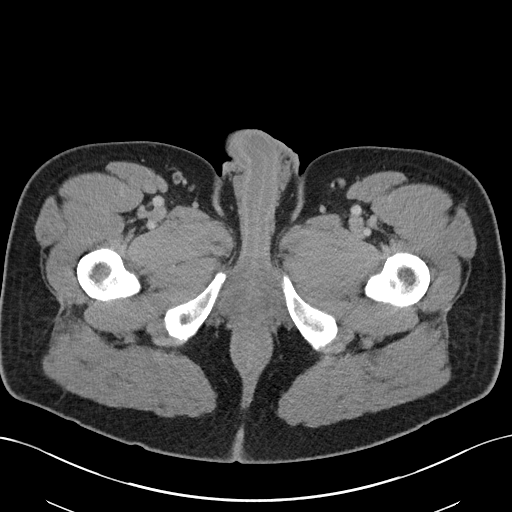
[im 5/95  bone]
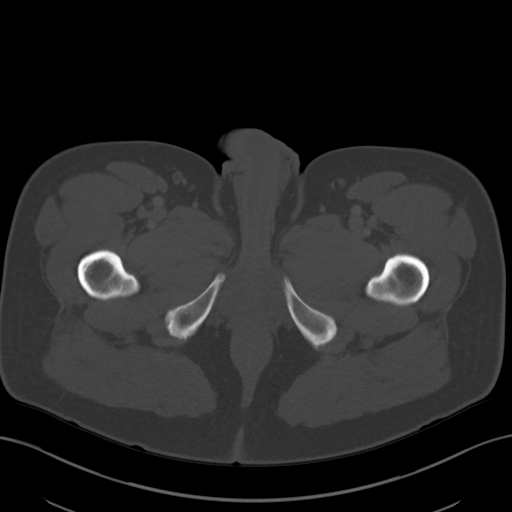
[im 15/95  soft-tissue]
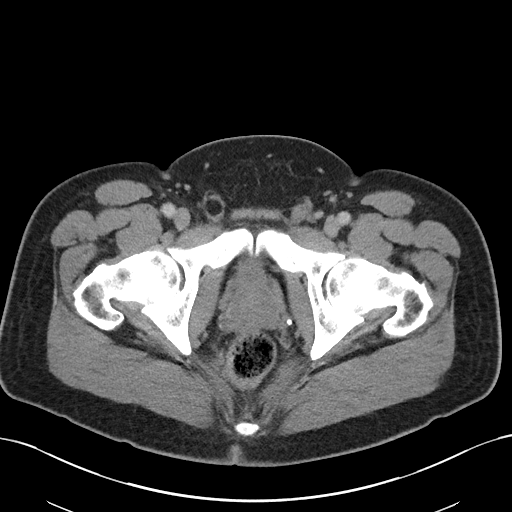
[im 19/95  soft-tissue]
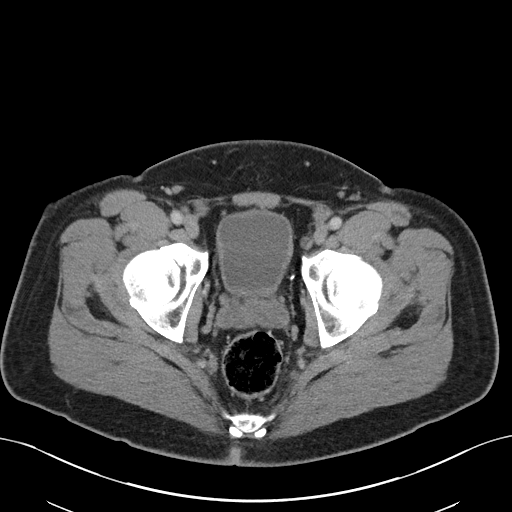
[im 24/95  soft-tissue]
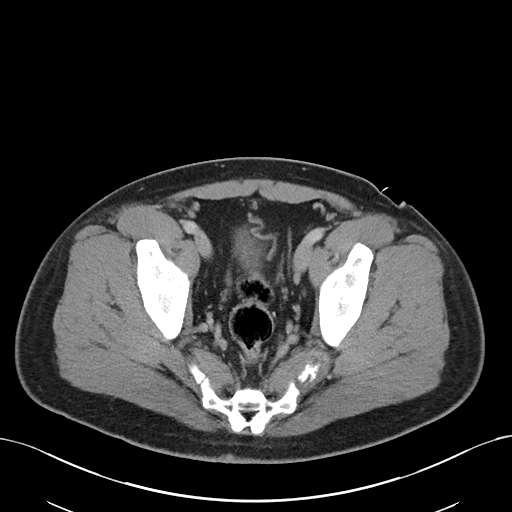
[im 33/95  soft-tissue]
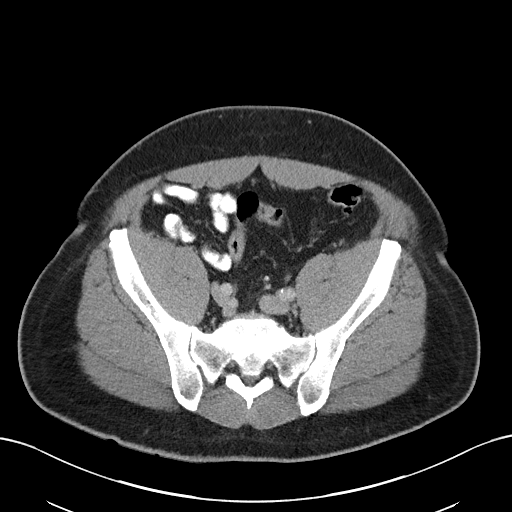
[im 38/95  soft-tissue]
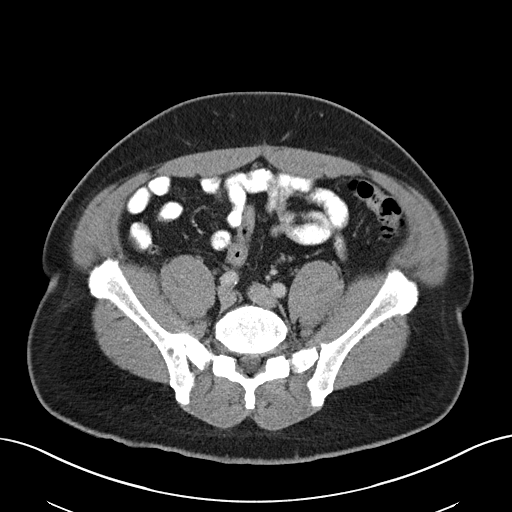
[im 43/95  soft-tissue]
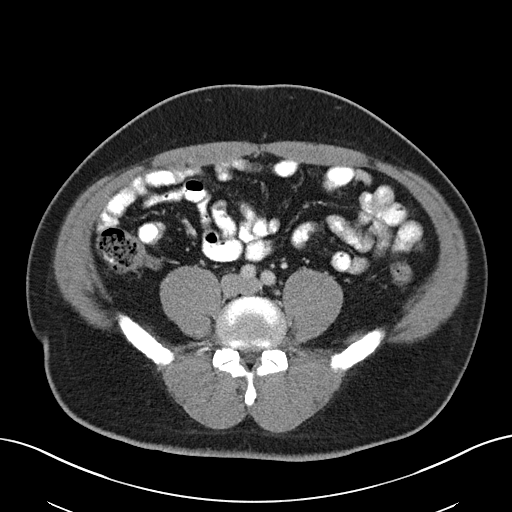
[im 52/95  soft-tissue]
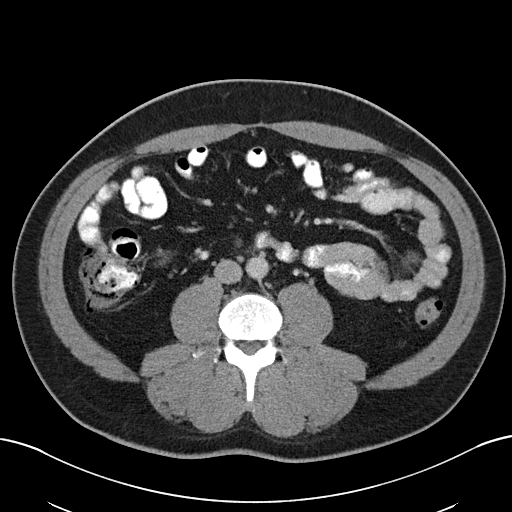
[im 57/95  soft-tissue]
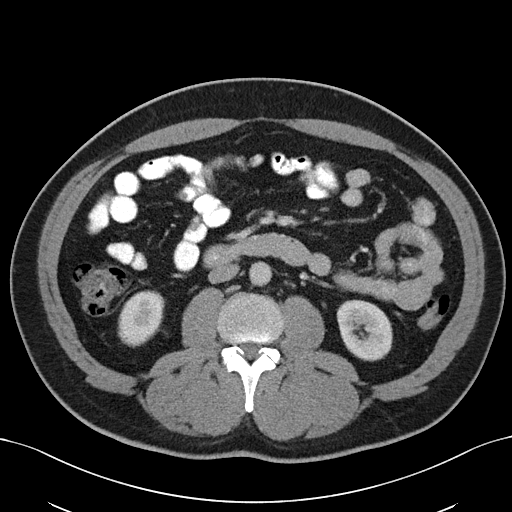
[im 57/95  bone]
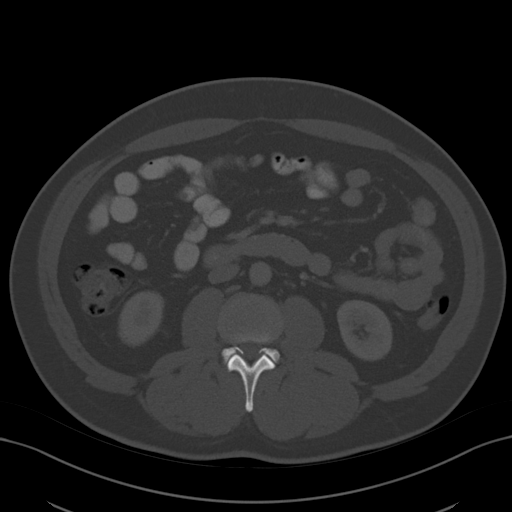
[im 62/95  soft-tissue]
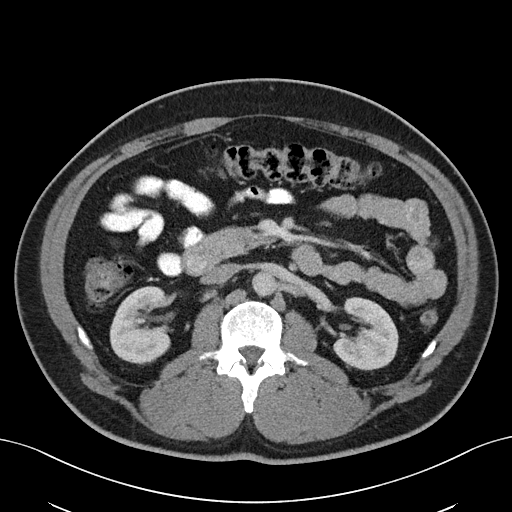
[im 71/95  soft-tissue]
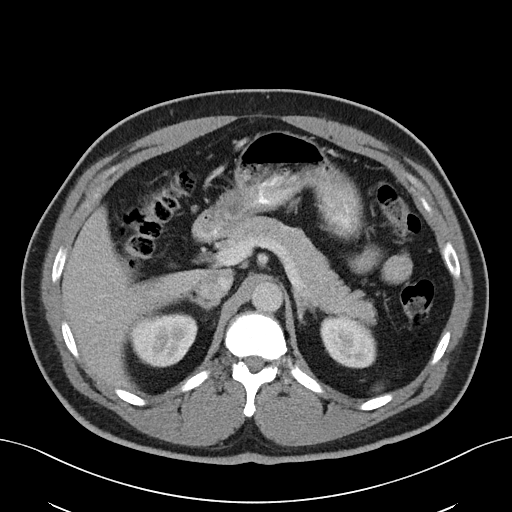
[im 76/95  soft-tissue]
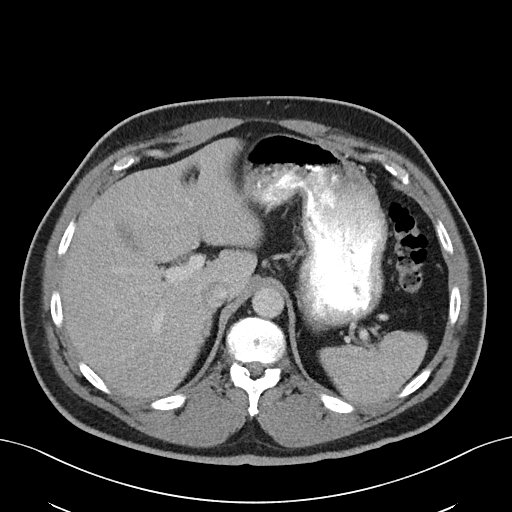
[im 80/95  soft-tissue]
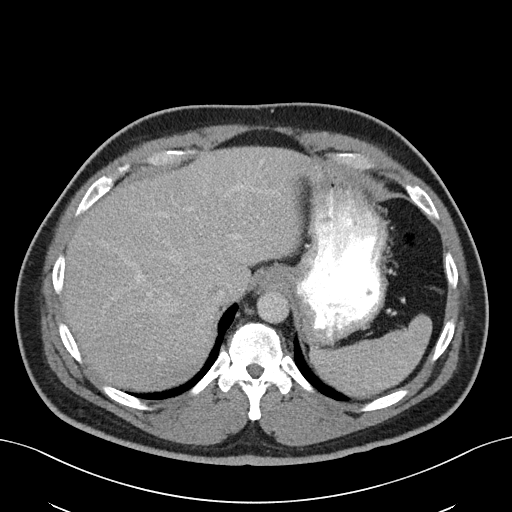
[im 90/95  soft-tissue]
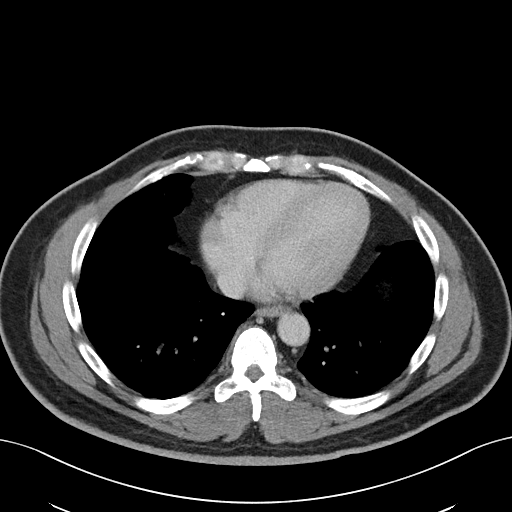

[Series 5: abd/pel w st · coronal · 0.76mm/px · 3 of 97 slices shown]
[im 33/97  soft-tissue]
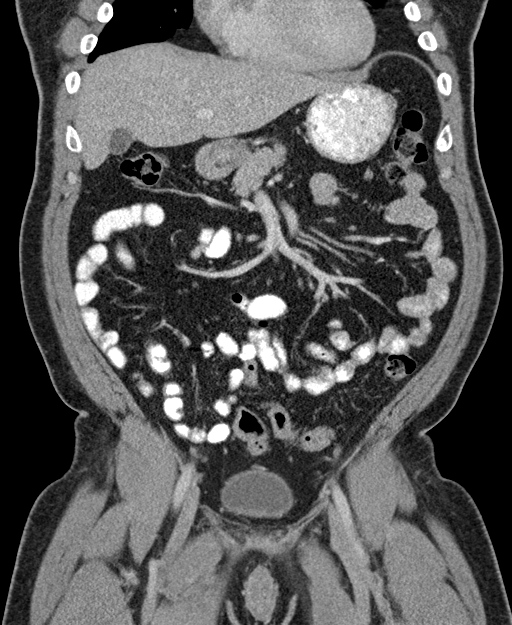
[im 43/97  soft-tissue]
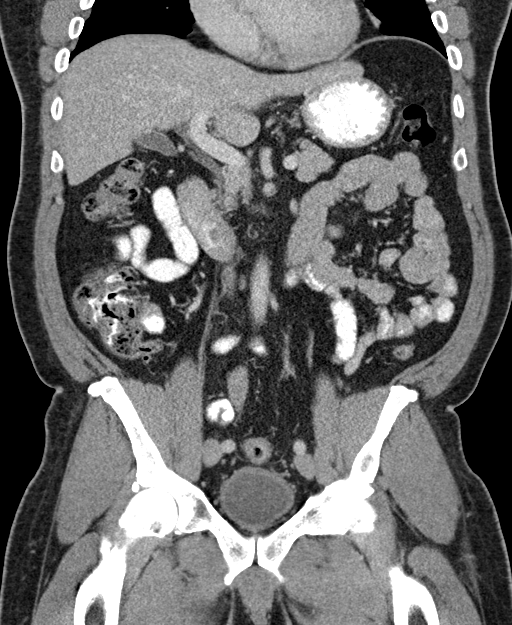
[im 54/97  soft-tissue]
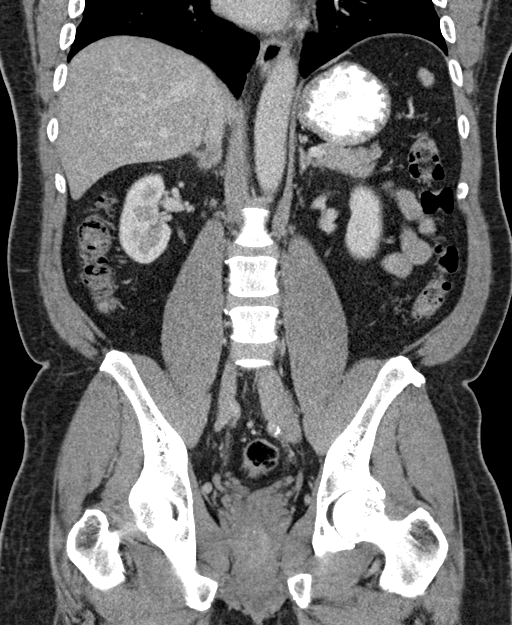

[17 of 46 positions shown; findings below may reference images not displayed]

FINDINGS: Lower Chest: No acute findings.

Hepatobiliary: No hepatic masses identified. Small cyst seen in
right hepatic lobe. Gallbladder is unremarkable. No evidence of
biliary ductal dilatation.

Pancreas:  No mass or inflammatory changes.

Spleen: Within normal limits in size and appearance.

Adrenals/Urinary Tract: No masses identified. No evidence of
hydronephrosis.

Stomach/Bowel: No evidence of obstruction, inflammatory process or
abnormal fluid collections.

Vascular/Lymphatic: No pathologically enlarged lymph nodes. No
abdominal aortic aneurysm.

Reproductive:  No mass or other significant abnormality.

Other:  None.

Musculoskeletal:  No suspicious bone lesions identified.
IMPRESSION: No acute findings or other significant abnormality.

## 2020-09-22 ENCOUNTER — Telehealth: Payer: Self-pay | Admitting: Family Medicine

## 2020-09-22 DIAGNOSIS — E785 Hyperlipidemia, unspecified: Secondary | ICD-10-CM

## 2020-09-22 MED ORDER — ROSUVASTATIN CALCIUM 20 MG PO TABS: 20.0000 mg | ORAL_TABLET | Freq: Every day | ORAL | 0 refills | Status: DC

## 2020-09-22 MED ORDER — AMLODIPINE BESYLATE-VALSARTAN 10-320 MG PO TABS: 1.0000 | ORAL_TABLET | Freq: Every day | ORAL | 5 refills | Status: DC

## 2020-09-22 NOTE — Telephone Encounter (Signed)
Change pharmacy  rosuvastatin (CRESTOR) 20 MG tablet [id number]  Rx #: [id number]  amLODipine-valsartan (EXFORGE) 10-320 MG tablet [id number]   [facility] [phone] - [place], [address]  [place], [place] [address]  Phone:  [phone]  Fax:  [phone]  DEA #:  [id number]

## 2020-11-28 ENCOUNTER — Telehealth (INDEPENDENT_AMBULATORY_CARE_PROVIDER_SITE_OTHER): Payer: 59 | Admitting: Medical

## 2020-11-28 ENCOUNTER — Other Ambulatory Visit (HOSPITAL_BASED_OUTPATIENT_CLINIC_OR_DEPARTMENT_OTHER): Payer: Self-pay

## 2020-11-28 ENCOUNTER — Other Ambulatory Visit: Payer: Self-pay

## 2020-11-28 DIAGNOSIS — J029 Acute pharyngitis, unspecified: Secondary | ICD-10-CM

## 2020-11-28 DIAGNOSIS — R0981 Nasal congestion: Secondary | ICD-10-CM

## 2020-11-28 DIAGNOSIS — M791 Myalgia, unspecified site: Secondary | ICD-10-CM | POA: Diagnosis not present

## 2020-11-28 DIAGNOSIS — R059 Cough, unspecified: Secondary | ICD-10-CM | POA: Diagnosis not present

## 2020-11-28 MED ORDER — FLUTICASONE PROPIONATE 50 MCG/ACT NA SUSP
2.0000 | Freq: Every day | NASAL | 1 refills | Status: DC
  Filled 2020-11-28: qty 16, 30d supply, fill #0

## 2020-11-28 MED ORDER — FLUTICASONE PROPIONATE 50 MCG/ACT NA SUSP: 2.0000 | Freq: Every day | NASAL | 1 refills | Status: DC

## 2020-11-28 MED ORDER — BENZONATATE 100 MG PO CAPS: 100.0000 mg | ORAL_CAPSULE | Freq: Three times a day (TID) | ORAL | 0 refills | Status: DC | PRN

## 2020-11-28 MED ORDER — BENZONATATE 100 MG PO CAPS
100.0000 mg | ORAL_CAPSULE | Freq: Three times a day (TID) | ORAL | 0 refills | Status: DC | PRN
  Filled 2020-11-28: qty 30, 10d supply, fill #0

## 2020-11-28 NOTE — Patient Instructions (Addendum)
You have signs and symptoms of viral syndrome symptoms suspicious for covid symptoms. PCR pending. Even if pcr negative then do rapid test tomorrow since you will be just at 5 days mark post signs/symptoms. Rx flonase nasal spray for congestion and rx benzonatate for cough.   If either pcr or rapid test positive rx antiviral since hx of only j and j vaccine.  If covid test negative consider getting throat culture/possible flu test as nurse visit.  May rx antibiotic as strep in differential. Pt does have sore throat.  Tylenol for fever and body aches. Avoid nsaids to htn and no bp reading today.  Follow up date to be detemined after covid test review.   11-29-2020. Talked with pt. Both pcr covid and rapid test were negative. Pt still has sore throat. Considered possible strep. Rx zpack. Start today and sent my chart note can return to work on Friday.

## 2020-11-28 NOTE — Progress Notes (Signed)
   Subjective:    Patient ID: Bruce Harris, male    DOB: 1970-09-02, 50 y.o.   MRN: [id number]  HPI Virtual Visit via Video Note  I connected with Bruce Harris on 11/28/20 at  4:20 PM EDT by a video enabled telemedicine application and verified that I am speaking with the correct person using two identifiers.  Location: Patient: home Provider: office   I discussed the limitations of evaluation and management by telemedicine and the availability of in person appointments. The patient expressed understanding and agreed to proceed.  History of Present Illness:   Pt states since Friday he got mild nasal congestion. On Monday had bodyaches, fever, chills and sweats. Pt states temp has been 99.5 max. Pt has been coughing up mucus. No sinus pain. Some mild st and mild tender submandibular nodes. No wheezing or sob.  Pt has been vaccinated against covid. He got on j and j 07-13-2019. No booster.    Observations/Objective:   General-no acute distress, pleasant, oriented. Lungs- on inspection lungs appear unlabored. Neck- no tracheal deviation or jvd on inspection. Neuro- gross motor function appears intact.  Throat- did not see well.   Assessment and Plan:  Patient Instructions  You have signs and symptoms of viral syndrome symptoms suspicious for covid symptoms. PCR pending. Even if pcr negative then do rapid test tomorrow since you will be just at 5 days mark post signs/symptoms. Rx flonase nasal spray for congestion and rx benzonatate for cough.   If either pcr or rapid test positive rx antiviral since hx of only j and j vaccine.  If covid test negative consider getting throat culture/possible flu test as nurse visit.  May rx antibiotic as strep in differential. Pt does have sore throat.   Follow up date to be determined after covid test review.  Mackie Pai, PA-C    Face to face time total 20 minutes.   Follow Up Instructions:    I discussed the assessment  and treatment plan with the patient. The patient was provided an opportunity to ask questions and all were answered. The patient agreed with the plan and demonstrated an understanding of the instructions.   The patient was advised to call back or seek an in-person evaluation if the symptoms worsen or if the condition fails to improve as anticipated.     Mackie Pai, PA-C    Review of Systems     Objective:   Physical Exam        Assessment & Plan:

## 2020-11-29 ENCOUNTER — Telehealth: Payer: Self-pay | Admitting: Family Medicine

## 2020-11-29 MED ORDER — AZITHROMYCIN 250 MG PO TABS: ORAL_TABLET | ORAL | 0 refills | Status: AC

## 2020-11-29 NOTE — Addendum Note (Signed)
Addended by: Anabel Halon on: 11/29/2020 09:01 AM   Modules accepted: Orders

## 2020-11-29 NOTE — Telephone Encounter (Signed)
Patient called regarding test results. He stated Bruce Harris informed him to contact him for a return to work note. [phone]. Please advise.

## 2020-12-06 ENCOUNTER — Other Ambulatory Visit (HOSPITAL_BASED_OUTPATIENT_CLINIC_OR_DEPARTMENT_OTHER): Payer: Self-pay

## 2020-12-27 ENCOUNTER — Telehealth: Payer: Self-pay | Admitting: Family Medicine

## 2020-12-27 NOTE — Telephone Encounter (Signed)
Error

## 2021-01-09 ENCOUNTER — Encounter: Payer: Self-pay | Admitting: Family Medicine

## 2021-01-09 ENCOUNTER — Ambulatory Visit (INDEPENDENT_AMBULATORY_CARE_PROVIDER_SITE_OTHER): Payer: 59 | Admitting: Family Medicine

## 2021-01-09 ENCOUNTER — Other Ambulatory Visit: Payer: Self-pay

## 2021-01-09 VITALS — BP 136/84 | HR 92 | Temp 98.4°F | Ht 68.5 in | Wt 232.0 lb

## 2021-01-09 DIAGNOSIS — M25561 Pain in right knee: Secondary | ICD-10-CM | POA: Diagnosis not present

## 2021-01-09 DIAGNOSIS — Z23 Encounter for immunization: Secondary | ICD-10-CM | POA: Diagnosis not present

## 2021-01-09 DIAGNOSIS — M25562 Pain in left knee: Secondary | ICD-10-CM

## 2021-01-09 MED ORDER — MELOXICAM 15 MG PO TABS: 15.0000 mg | ORAL_TABLET | Freq: Every day | ORAL | 0 refills | Status: DC

## 2021-01-09 NOTE — Patient Instructions (Signed)
Heat (pad or rice pillow in microwave) over affected area, 10-15 minutes twice daily.   Ice/cold pack over area for 10-15 min twice daily.  OK to take Tylenol 1000 mg (2 extra strength tabs) or 975 mg (3 regular strength tabs) every 6 hours as needed.  Let us know if you need anything.  Iliotibial Band Syndrome Rehab It is normal to feel mild stretching, pulling, tightness, or discomfort as you do these exercises, but you should stop right away if you feel sudden pain or your pain gets worse.  Stretching and range of motion exercises These exercises warm up your muscles and joints and improve the movement and flexibility of your hip and pelvis. Exercise A: Quadriceps, prone    Lie on your abdomen on a firm surface, such as a bed or padded floor. Bend your left / right knee and hold your ankle. If you cannot reach your ankle or pant leg, loop a belt around your foot and grab the belt instead. Gently pull your heel toward your buttocks. Your knee should not slide out to the side. You should feel a stretch in the front of your thigh and knee. Hold this position for 30 seconds. Repeat 2 times. Complete this stretch 3 times per week. Exercise B: Iliotibial band    Lie on your side with your left / right leg in the top position. Bend both of your knees and grab your left / right ankle. Stretch out your bottom arm to help you balance. Slowly bring your top knee back so your thigh goes behind your trunk. Slowly lower your top leg toward the floor until you feel a gentle stretch on the outside of your left / right hip and thigh. If you do not feel a stretch and your knee will not fall farther, place the heel of your other foot on top of your knee and pull your knee down toward the floor with your foot. Hold this position for 30 seconds. Repeat 2 times. Complete this stretch 3 times per week. Strengthening exercises These exercises build strength and endurance in your hip and pelvis. Endurance is  the ability to use your muscles for a long time, even after they get tired. Exercise C: Straight leg raises (hip abductors)     Lie on your side with your left / right leg in the top position. Lie so your head, shoulder, knee, and hip line up. You may bend your bottom knee to help you balance. Roll your hips slightly forward so your hips are stacked directly over each other and your left / right knee is facing forward. Tense the muscles in your outer thigh and lift your top leg 4-6 inches (10-15 cm). Hold this position for 3 seconds. Repeat for a total of 10 reps. Slowly return to the starting position. Let your muscles relax completely before doing another repetition. Repeat 2 times. Complete this exercise 3 times per week. Exercise D: Straight leg raises (hip extensors) Lie on your abdomen on your bed or a firm surface. You can put a pillow under your hips if that is more comfortable. Bend your left / right knee so your foot is straight up in the air. Squeeze your buttock muscles and lift your left / right thigh off the bed. Do not let your back arch. Tense this muscle as hard as you can without increasing any knee pain. Hold this position for 2 seconds. Repeat for a total of 10 reps Slowly lower your leg to the  starting position and allow it to relax completely. Repeat 2 times. Complete this exercise 3 times per week. Exercise E: Hip hike Stand sideways on a bottom step. Stand on your left / right leg with your other foot unsupported next to the step. You can hold onto the railing or wall if needed for balance. Keep your knees straight and your torso square. Then, lift your left / right hip up toward the ceiling. Slowly let your left / right hip lower toward the floor, past the starting position. Your foot should get closer to the floor. Do not lean or bend your knees. Repeat 2 times. Complete this exercise 3 times per week.  Document Released: 02/18/2005 Document Revised: 10/24/2015  Document Reviewed: 01/20/2015 Elsevier Interactive Patient Education  2018 [employer].   Patellar Tendinitis Rehab Ask your health care provider which exercises are safe for you. Do exercises exactly as told by your health care provider and adjust them as directed. It is normal to feel mild stretching, pulling, tightness, or discomfort as you do these exercises, but you should stop right away if you feel sudden pain or your pain gets worse. Do not begin these exercises until told by your health care provider. Stretching and range of motion exercises This exercise warms up your muscles and joints and improves the movement and flexibility of your knee. This exercise also helps to relieve pain and stiffness. Exercise A: Hamstring, doorway  Lie on your back in front of a doorway with your R leg resting against the wall and your other leg flat on the floor in the doorway. There should be a slight bend in your knee. Straighten your knee. You should feel a stretch behind your knee or thigh. If you do not, scoot your buttocks closer to the door. Hold this position for 30 seconds. Repeat2 times. Complete this stretch 3 times per week. Strengthening exercises These exercises build strength and endurance in your knee. Endurance is the ability to use your muscles for a long time, even after they get tired. Exercise B: Quadriceps, isometric  Lie on your back with your R leg extended and your other knee bent. Slowly tense the muscles in the front of your  thigh. When you do this, you should see your kneecap slide up toward your hip or see increased dimpling just above the knee. This motion will push the back of your knee toward the floor. If this is painful, try putting a rolled-up hand towel under your knee to support it in a bent position. Change the size of the towel to find a position that allows you to do this exercise without any pain. For 3 seconds, hold the muscle as tight as you can without  increasing your pain. Relax the muscles slowly and completely. Repeat 2 times. Complete this exercise 3 times per week. Exercise C: Straight leg raises ( quadriceps) Lie on your back with your R leg extended and your other knee bent. Tense the muscles in the front of your R thigh. When you do this, you should see your kneecap slide up or see increased dimpling just above the knee. Keep these muscles tight as you raise your leg 4-6 inches (10-15 cm) off the floor. Do not let your moving knee bend. Hold this position for 1 second. Keep these muscles tense as you slowly lower your leg. Relax your muscles slowly and completely. Repeat 2 times. Complete this exercise 3 times per week Exercise D: Squats Stand in front of a table,  with your feet and knees pointing straight ahead. You may rest your hands on the table for balance but not for support. Slowly bend your knees and lower your hips like you are going to sit in a chair. Keep your weight over your heels, not over your toes. Keep your lower legs upright so they are parallel with the table legs. Do not let your hips go lower than your knees. Do not bend lower than told by your health care provider. If your knee pain increases, do not bend as low. Hold the squat position for 1 seconds. Slowly push with your legs to return to standing. Do not use your hands to pull yourself to standing. Repeat 2 times. Complete this exercise 3 times per week. Exercise E: Step-downs Stand on the edge of a step. Keeping your weight over your R heel, slowly bend your R knee to bring your R heel toward the floor. Lower your heel as far as you can while keeping control and without increasing any discomfort. Do not let your knee come forward. Use your leg muscles, not gravity, to lower your body. Hold a wall or rail for balance if needed. Slowly push through your heel to lift your body weight back up. Return to the starting position. Repeat2 times. Complete this  exercise 3 times per week. Exercise F: Straight leg raises ( hip abductors) Lie on your side with your R leg in the top position. Lie so your head, shoulder, knee, and hip line up. You may bend your lower knee to help you keep your balance. Roll your hips slightly forward, so that your hips are stacked directly over each other and your R knee is facing forward. Leading with your heel, lift your top leg 4-6 inches (10-15 cm). You should feel the muscles in your outer hip lifting. Do not let your foot drift forward. Do not let your knee roll toward the ceiling. Hold this position for 3 seconds. Slowly lower your leg to the starting position. Let your muscles relax completely after each repetition. Repeat 2 times. Complete this exercise 3 times per week. This information is not intended to replace advice given to you by your health care provider. Make sure you discuss any questions you have with your health care provider. Document Released: 02/18/2005 Document Revised: 10/26/2015 Document Reviewed: 11/22/2014 Elsevier Interactive Patient Education  [employer].

## 2021-01-09 NOTE — Progress Notes (Signed)
Musculoskeletal Exam  Patient: Bruce Harris DOB: [birth year]  DOS: 01/09/2021  SUBJECTIVE:  Chief Complaint:   Chief Complaint  Patient presents with   Leg Pain    Both      Bruce Harris is a 50 y.o.  male for evaluation and treatment of bilateral knee pain.   Onset:  1 month ago. No inj or change in activity. R knee buckled over weekend and started to become painful.  Location: front of L knee and inside R knee Character:  aching and dull  Progression of issue:  is unchanged Associated symptoms: swelling and redness on R; R knee stiffens when he sits too long.  Denies bruising.  Treatment: to date has been ice, OTC NSAIDS, and acetaminophen.   Neurovascular symptoms: no  Past Medical History:  Diagnosis Date   Cardiomyopathy    Chicken pox    Gout    Hyperglycemia    Hyperlipidemia    Hypertension    Hypogonadism in male    OSA (obstructive sleep apnea) 05/19/2017   Toxic effect of venom of bees     Objective: VITAL SIGNS: BP 136/84   Pulse 92   Temp 98.4 F (36.9 C) (Oral)   Ht 5' 8.5" (1.74 m)   Wt 232 lb (105.2 kg)   SpO2 96%   BMI 34.76 kg/m  Constitutional: Well formed, well developed. No acute distress. Thorax & Lungs: No accessory muscle use Musculoskeletal: knees.   Normal active range of motion: yes.   Normal passive range of motion: yes Tenderness to palpation: yes over b/l patellar tendon, L distal IT band No Jt line ttp.  Deformity: no Ecchymosis: no Tests positive: none Tests negative: Lachman's, patellar app/grind, varus/valgus stress, Stine's Neurologic: Normal sensory function. Antalgic gait Psychiatric: Normal mood. Age appropriate judgment and insight. Alert & oriented x 3.    Assessment:  Acute pain of both knees - Plan: meloxicam (MOBIC) 15 MG tablet  Plan: Stretches/exercises for patellar tendon and IT band, heat, ice, Tylenol, meloxicam prn. Should be OK to return to work tomorrow.   F/u prn. The patient voiced  understanding and agreement to the plan.   [place], DO 01/09/21  2:37 PM

## 2021-02-11 ENCOUNTER — Other Ambulatory Visit: Payer: Self-pay | Admitting: Family Medicine

## 2021-02-11 DIAGNOSIS — M25562 Pain in left knee: Secondary | ICD-10-CM

## 2021-02-11 DIAGNOSIS — M25561 Pain in right knee: Secondary | ICD-10-CM

## 2021-06-19 ENCOUNTER — Telehealth: Payer: Self-pay | Admitting: Family Medicine

## 2021-06-19 NOTE — Telephone Encounter (Signed)
Updated pharmacy

## 2021-06-19 NOTE — Telephone Encounter (Signed)
Pt called stating he would like to have his medications sent over to a different pharmacy going forward. Pharmacy info is as follows: ? ?[facility] ?[address], [place], [place] [address] ?Phone: [phone] ?

## 2021-10-01 ENCOUNTER — Other Ambulatory Visit: Payer: Self-pay | Admitting: Family Medicine

## 2021-10-01 MED ORDER — AMLODIPINE BESYLATE-VALSARTAN 10-320 MG PO TABS: 1.0000 | ORAL_TABLET | Freq: Every day | ORAL | 5 refills | Status: DC

## 2021-11-16 ENCOUNTER — Ambulatory Visit: Payer: 59 | Admitting: Family Medicine

## 2021-11-23 ENCOUNTER — Ambulatory Visit: Payer: 59 | Admitting: Family Medicine

## 2021-11-23 ENCOUNTER — Telehealth: Payer: Self-pay

## 2021-11-23 NOTE — Telephone Encounter (Signed)
1st NS letter sent out (mailed to pt).

## 2022-02-19 ENCOUNTER — Ambulatory Visit: Payer: 59 | Admitting: Family Medicine

## 2022-02-19 ENCOUNTER — Encounter: Payer: Self-pay | Admitting: Family Medicine

## 2022-02-19 VITALS — BP 138/82 | HR 85 | Temp 98.6°F | Ht 68.0 in | Wt 235.0 lb

## 2022-02-19 DIAGNOSIS — I10 Essential (primary) hypertension: Secondary | ICD-10-CM | POA: Diagnosis not present

## 2022-02-19 DIAGNOSIS — R519 Headache, unspecified: Secondary | ICD-10-CM

## 2022-02-19 MED ORDER — HYDROCHLOROTHIAZIDE 25 MG PO TABS: 25.0000 mg | ORAL_TABLET | Freq: Every day | ORAL | 3 refills | Status: DC

## 2022-02-19 MED ORDER — TIZANIDINE HCL 4 MG PO TABS: 4.0000 mg | ORAL_TABLET | Freq: Four times a day (QID) | ORAL | 0 refills | Status: DC | PRN

## 2022-02-19 NOTE — Patient Instructions (Addendum)
Heat (pad or rice pillow in microwave) over affected area, 10-15 minutes twice daily.   Ice/cold pack over area for 10-15 min twice daily.  OK to take Tylenol 1000 mg (2 extra strength tabs) or 975 mg (3 regular strength tabs) every 6 hours as needed.  Check your blood pressures 2-3 times per week, alternating the time of day you check it. If it is high, considering waiting 1-2 minutes and rechecking. If it gets higher, your anxiety is likely creeping up and we should avoid rechecking.   Keep the diet clean and stay active.  Let us know if you need anything.  EXERCISES RANGE OF MOTION (ROM) AND STRETCHING EXERCISES  These exercises may help you when beginning to rehabilitate your issue. In order to successfully resolve your symptoms, you must improve your posture. These exercises are designed to help reduce the forward-head and rounded-shoulder posture which contributes to this condition. Your symptoms may resolve with or without further involvement from your physician, physical therapist or athletic trainer. While completing these exercises, remember:  Restoring tissue flexibility helps normal motion to return to the joints. This allows healthier, less painful movement and activity. An effective stretch should be held for at least 20 seconds, although you may need to begin with shorter hold times for comfort. A stretch should never be painful. You should only feel a gentle lengthening or release in the stretched tissue. Do not do any stretch or exercise that you cannot tolerate.  STRETCH- Axial Extensors Lie on your back on the floor. You may bend your knees for comfort. Place a rolled-up hand towel or dish towel, about 2 inches in diameter, under the part of your head that makes contact with the floor. Gently tuck your chin, as if trying to make a "double chin," until you feel a gentle stretch at the base of your head. Hold 15-20 seconds. Repeat 2-3 times. Complete this exercise 1 time per  day.   STRETCH - Axial Extension  Stand or sit on a firm surface. Assume a good posture: chest up, shoulders drawn back, abdominal muscles slightly tense, knees unlocked (if standing) and feet hip width apart. Slowly retract your chin so your head slides back and your chin slightly lowers. Continue to look straight ahead. You should feel a gentle stretch in the back of your head. Be certain not to feel an aggressive stretch since this can cause headaches later. Hold for 15-20 seconds. Repeat 2-3 times. Complete this exercise 1 time per day.  STRETCH - Cervical Side Bend  Stand or sit on a firm surface. Assume a good posture: chest up, shoulders drawn back, abdominal muscles slightly tense, knees unlocked (if standing) and feet hip width apart. Without letting your nose or shoulders move, slowly tip your right / left ear to your shoulder until your feel a gentle stretch in the muscles on the opposite side of your neck. Hold 15-20 seconds. Repeat 2-3 times. Complete this exercise 1-2 times per day.  STRETCH - Cervical Rotators  Stand or sit on a firm surface. Assume a good posture: chest up, shoulders drawn back, abdominal muscles slightly tense, knees unlocked (if standing) and feet hip width apart. Keeping your eyes level with the ground, slowly turn your head until you feel a gentle stretch along the back and opposite side of your neck. Hold 15-20 seconds. Repeat 2-3 times. Complete this exercise 1-2 times per day.  RANGE OF MOTION - Neck Circles  Stand or sit on a firm surface. Assume  a good posture: chest up, shoulders drawn back, abdominal muscles slightly tense, knees unlocked (if standing) and feet hip width apart. Gently roll your head down and around from the back of one shoulder to the back of the other. The motion should never be forced or painful. Repeat the motion 10-20 times, or until you feel the neck muscles relax and loosen. Repeat 2-3 times. Complete the exercise 1-2 times  per day. STRENGTHENING EXERCISES - Cervical Strain and Sprain These exercises may help you when beginning to rehabilitate your injury. They may resolve your symptoms with or without further involvement from your physician, physical therapist, or athletic trainer. While completing these exercises, remember:  Muscles can gain both the endurance and the strength needed for everyday activities through controlled exercises. Complete these exercises as instructed by your physician, physical therapist, or athletic trainer. Progress the resistance and repetitions only as guided. You may experience muscle soreness or fatigue, but the pain or discomfort you are trying to eliminate should never worsen during these exercises. If this pain does worsen, stop and make certain you are following the directions exactly. If the pain is still present after adjustments, discontinue the exercise until you can discuss the trouble with your clinician.  STRENGTH - Cervical Flexors, Isometric Face a wall, standing about 6 inches away. Place a small pillow, a ball about 6-8 inches in diameter, or a folded towel between your forehead and the wall. Slightly tuck your chin and gently push your forehead into the soft object. Push only with mild to moderate intensity, building up tension gradually. Keep your jaw and forehead relaxed. Hold 10 to 20 seconds. Keep your breathing relaxed. Release the tension slowly. Relax your neck muscles completely before you start the next repetition. Repeat 2-3 times. Complete this exercise 1 time per day.  STRENGTH- Cervical Lateral Flexors, Isometric  Stand about 6 inches away from a wall. Place a small pillow, a ball about 6-8 inches in diameter, or a folded towel between the side of your head and the wall. Slightly tuck your chin and gently tilt your head into the soft object. Push only with mild to moderate intensity, building up tension gradually. Keep your jaw and forehead relaxed. Hold 10  to 20 seconds. Keep your breathing relaxed. Release the tension slowly. Relax your neck muscles completely before you start the next repetition. Repeat 2-3 times. Complete this exercise 1 time per day.  STRENGTH - Cervical Extensors, Isometric  Stand about 6 inches away from a wall. Place a small pillow, a ball about 6-8 inches in diameter, or a folded towel between the back of your head and the wall. Slightly tuck your chin and gently tilt your head back into the soft object. Push only with mild to moderate intensity, building up tension gradually. Keep your jaw and forehead relaxed. Hold 10 to 20 seconds. Keep your breathing relaxed. Release the tension slowly. Relax your neck muscles completely before you start the next repetition. Repeat 2-3 times. Complete this exercise 1 time per day.  POSTURE AND BODY MECHANICS CONSIDERATIONS Keeping correct posture when sitting, standing or completing your activities will reduce the stress put on different body tissues, allowing injured tissues a chance to heal and limiting painful experiences. The following are general guidelines for improved posture. Your physician or physical therapist will provide you with any instructions specific to your needs. While reading these guidelines, remember: The exercises prescribed by your provider will help you have the flexibility and strength to maintain correct postures.  The correct posture provides the optimal environment for your joints to work. All of your joints have less wear and tear when properly supported by a spine with good posture. This means you will experience a healthier, less painful body. Correct posture must be practiced with all of your activities, especially prolonged sitting and standing. Correct posture is as important when doing repetitive low-stress activities (typing) as it is when doing a single heavy-load activity (lifting).  PROLONGED STANDING WHILE SLIGHTLY LEANING FORWARD When completing a  task that requires you to lean forward while standing in one place for a long time, place either foot up on a stationary 2- to 4-inch high object to help maintain the best posture. When both feet are on the ground, the low back tends to lose its slight inward curve. If this curve flattens (or becomes too large), then the back and your other joints will experience too much stress, fatigue more quickly, and can cause pain.   RESTING POSITIONS Consider which positions are most painful for you when choosing a resting position. If you have pain with flexion-based activities (sitting, bending, stooping, squatting), choose a position that allows you to rest in a less flexed posture. You would want to avoid curling into a fetal position on your side. If your pain worsens with extension-based activities (prolonged standing, working overhead), avoid resting in an extended position such as sleeping on your stomach. Most people will find more comfort when they rest with their spine in a more neutral position, neither too rounded nor too arched. Lying on a non-sagging bed on your side with a pillow between your knees, or on your back with a pillow under your knees will often provide some relief. Keep in mind, being in any one position for a prolonged period of time, no matter how correct your posture, can still lead to stiffness.  WALKING Walk with an upright posture. Your ears, shoulders, and hips should all line up. OFFICE WORK When working at a desk, create an environment that supports good, upright posture. Without extra support, muscles fatigue and lead to excessive strain on joints and other tissues.  CHAIR: A chair should be able to slide under your desk when your back makes contact with the back of the chair. This allows you to work closely. The chair's height should allow your eyes to be level with the upper part of your monitor and your hands to be slightly lower than your elbows. Body position: Your feet  should make contact with the floor. If this is not possible, use a foot rest. Keep your ears over your shoulders. This will reduce stress on your neck and low back.

## 2022-02-19 NOTE — Progress Notes (Signed)
Chief Complaint  Patient presents with   Ear Pain    Right  Right eye and head pain     Subjective: Patient is a 51 y.o. male here for R sided headache.  2 weeks ago started having a R sided HA. Constant. Associated jaw, R sided neck and ear pain. No drainage. No fevers, recent illness, vision changes, difficulty swallowing, dental issues, trouble w speech, numbness/tingling, balance, weakness. He has been taking Tylenol and ibuprofen for aid. It helps a little. He has been eating more meat lately.   Hypertension Patient presents for hypertension follow up. He does monitor home blood pressures. Blood pressures ranging on average from 130-140's/80's. He is compliant with medications- Exforge 10-320 mg/d. Patient has these side effects of medication: none He is adhering to a healthy diet overall. Exercise: walking No Cp or SOB.   Past Medical History:  Diagnosis Date   Cardiomyopathy    Chicken pox    Gout    Hyperglycemia    Hyperlipidemia    Hypertension    Hypogonadism in male    OSA (obstructive sleep apnea) 05/19/2017   Toxic effect of venom of bees     Objective: BP 138/82 (BP Location: Left Arm, Cuff Size: Large)   Pulse 85   Temp 98.6 F (37 C) (Oral)   Ht '5\' 8"'$  (1.727 m)   Wt 235 lb (106.6 kg)   SpO2 95%   BMI 35.73 kg/m  General: Awake, appears stated age Heart: RRR, no LE edema MSK: +TTP over lateral R neck, no ttp over TMJ or temporalis Neuro: DTR's equal and symmetric throughout, no clonus, 5/5 strength throughout, no cerebellar signs Lungs: CTAB, no rales, wheezes or rhonchi. No accessory muscle use Psych: Age appropriate judgment and insight, normal affect and mood  Assessment and Plan: Acute nonintractable headache, unspecified headache type - Plan: tiZANidine (ZANAFLEX) 4 MG tablet  Essential hypertension - Plan: hydrochlorothiazide (HYDRODIURIL) 25 MG tablet  Heat, Tylenol, ice, avoid chewy foods, NSAIDs, non-sedating muscle relaxer,  stretching/exercises.  Chronic, uncontrolled. Add HCTZ 25 mg/d. Cont Exforge 10-320 mg/d. F/u in 1 mo for CPE and reck this.  The patient voiced understanding and agreement to the plan.  [place], DO 02/19/22  3:35 PM

## 2022-02-23 ENCOUNTER — Other Ambulatory Visit: Payer: Self-pay | Admitting: Family Medicine

## 2022-02-23 DIAGNOSIS — R519 Headache, unspecified: Secondary | ICD-10-CM

## 2022-02-28 ENCOUNTER — Other Ambulatory Visit: Payer: Self-pay | Admitting: Family Medicine

## 2022-02-28 DIAGNOSIS — R519 Headache, unspecified: Secondary | ICD-10-CM

## 2022-03-09 ENCOUNTER — Other Ambulatory Visit: Payer: Self-pay | Admitting: Family Medicine

## 2022-03-09 DIAGNOSIS — R519 Headache, unspecified: Secondary | ICD-10-CM

## 2022-03-12 ENCOUNTER — Other Ambulatory Visit: Payer: Self-pay | Admitting: Family Medicine

## 2022-03-12 ENCOUNTER — Encounter: Payer: 59 | Admitting: Family Medicine

## 2022-03-12 ENCOUNTER — Other Ambulatory Visit (INDEPENDENT_AMBULATORY_CARE_PROVIDER_SITE_OTHER): Payer: 59

## 2022-03-12 DIAGNOSIS — E785 Hyperlipidemia, unspecified: Secondary | ICD-10-CM

## 2022-03-12 DIAGNOSIS — Z0000 Encounter for general adult medical examination without abnormal findings: Secondary | ICD-10-CM

## 2022-03-12 LAB — COMPREHENSIVE METABOLIC PANEL
ALT: 15 U/L (ref 0–53)
AST: 22 U/L (ref 0–37)
Albumin: 4.5 g/dL (ref 3.5–5.2)
Alkaline Phosphatase: 64 U/L (ref 39–117)
BUN: 23 mg/dL (ref 6–23)
CO2: 29 mEq/L (ref 19–32)
Calcium: 9.5 mg/dL (ref 8.4–10.5)
Chloride: 103 mEq/L (ref 96–112)
Creatinine, Ser: 2.16 mg/dL — ABNORMAL HIGH (ref 0.40–1.50)
GFR: 34.59 mL/min — ABNORMAL LOW (ref >60.00)
Glucose, Bld: 98 mg/dL (ref 70–99)
Potassium: 3.5 mEq/L (ref 3.5–5.1)
Sodium: 142 mEq/L (ref 135–145)
Total Bilirubin: 0.6 mg/dL (ref 0.2–1.2)
Total Protein: 8.1 g/dL (ref 6.0–8.3)

## 2022-03-12 LAB — LIPID PANEL
Cholesterol: 275 mg/dL — ABNORMAL HIGH (ref 0–200)
HDL: 45.7 mg/dL (ref >39.00)
LDL Cholesterol: 202 mg/dL — ABNORMAL HIGH (ref 0–99)
NonHDL: 229.41
Total CHOL/HDL Ratio: 6
Triglycerides: 138 mg/dL (ref 0.0–149.0)
VLDL: 27.6 mg/dL (ref 0.0–40.0)

## 2022-03-12 LAB — CBC
HCT: 43.7 % (ref 39.0–52.0)
Hemoglobin: 13.7 g/dL (ref 13.0–17.0)
MCHC: 31.5 g/dL (ref 30.0–36.0)
MCV: 80.4 fl (ref 78.0–100.0)
Platelets: 307 10*3/uL (ref 150.0–400.0)
RBC: 5.43 Mil/uL (ref 4.22–5.81)
RDW: 15.6 % — ABNORMAL HIGH (ref 11.5–15.5)
WBC: 5.9 10*3/uL (ref 4.0–10.5)

## 2022-03-12 LAB — HEMOGLOBIN A1C: Hgb A1c MFr Bld: 6.3 % (ref 4.6–6.5)

## 2022-03-12 LAB — PSA: PSA: 1.33 ng/mL (ref 0.10–4.00)

## 2022-03-12 MED ORDER — ROSUVASTATIN CALCIUM 20 MG PO TABS: 20.0000 mg | ORAL_TABLET | Freq: Every day | ORAL | 0 refills | Status: DC

## 2022-03-15 ENCOUNTER — Other Ambulatory Visit: Payer: Self-pay | Admitting: Family Medicine

## 2022-03-15 DIAGNOSIS — R519 Headache, unspecified: Secondary | ICD-10-CM

## 2022-03-26 ENCOUNTER — Encounter: Payer: 59 | Admitting: Family Medicine

## 2022-06-17 ENCOUNTER — Encounter: Payer: Self-pay | Admitting: *Deleted

## 2022-07-31 ENCOUNTER — Inpatient Hospital Stay (HOSPITAL_COMMUNITY)
Admission: EM | Admit: 2022-07-31 | Discharge: 2022-08-03 | DRG: 305 | Disposition: A | Discharge status: Home / Self Care | Payer: 59 | Attending: Internal Medicine | Admitting: Internal Medicine

## 2022-07-31 ENCOUNTER — Emergency Department (HOSPITAL_COMMUNITY): Payer: 59

## 2022-07-31 ENCOUNTER — Other Ambulatory Visit: Payer: Self-pay

## 2022-07-31 ENCOUNTER — Encounter (HOSPITAL_COMMUNITY): Payer: Self-pay

## 2022-07-31 DIAGNOSIS — Z91030 Bee allergy status: Secondary | ICD-10-CM

## 2022-07-31 DIAGNOSIS — G4733 Obstructive sleep apnea (adult) (pediatric): Secondary | ICD-10-CM | POA: Diagnosis present

## 2022-07-31 DIAGNOSIS — N179 Acute kidney failure, unspecified: Secondary | ICD-10-CM | POA: Diagnosis not present

## 2022-07-31 DIAGNOSIS — I160 Hypertensive urgency: Secondary | ICD-10-CM | POA: Diagnosis present

## 2022-07-31 DIAGNOSIS — Z79899 Other long term (current) drug therapy: Secondary | ICD-10-CM

## 2022-07-31 DIAGNOSIS — Z833 Family history of diabetes mellitus: Secondary | ICD-10-CM

## 2022-07-31 DIAGNOSIS — R7989 Other specified abnormal findings of blood chemistry: Secondary | ICD-10-CM | POA: Diagnosis present

## 2022-07-31 DIAGNOSIS — Z8249 Family history of ischemic heart disease and other diseases of the circulatory system: Secondary | ICD-10-CM

## 2022-07-31 DIAGNOSIS — E785 Hyperlipidemia, unspecified: Secondary | ICD-10-CM | POA: Diagnosis present

## 2022-07-31 DIAGNOSIS — I10 Essential (primary) hypertension: Secondary | ICD-10-CM | POA: Diagnosis present

## 2022-07-31 DIAGNOSIS — M109 Gout, unspecified: Secondary | ICD-10-CM | POA: Diagnosis present

## 2022-07-31 DIAGNOSIS — I161 Hypertensive emergency: Principal | ICD-10-CM | POA: Diagnosis present

## 2022-07-31 DIAGNOSIS — I428 Other cardiomyopathies: Secondary | ICD-10-CM | POA: Diagnosis not present

## 2022-07-31 DIAGNOSIS — I429 Cardiomyopathy, unspecified: Secondary | ICD-10-CM | POA: Diagnosis present

## 2022-07-31 DIAGNOSIS — E876 Hypokalemia: Secondary | ICD-10-CM | POA: Diagnosis not present

## 2022-07-31 HISTORY — DX: Hypertensive urgency: I16.0

## 2022-07-31 LAB — CBC
HCT: 44.2 % (ref 39.0–52.0)
Hemoglobin: 13.6 g/dL (ref 13.0–17.0)
MCH: 24.9 pg — ABNORMAL LOW (ref 26.0–34.0)
MCHC: 30.8 g/dL (ref 30.0–36.0)
MCV: 80.8 fL (ref 80.0–100.0)
Platelets: 273 10*3/uL (ref 150–400)
RBC: 5.47 MIL/uL (ref 4.22–5.81)
RDW: 15.2 % (ref 11.5–15.5)
WBC: 6.1 10*3/uL (ref 4.0–10.5)
nRBC: 0 % (ref 0.0–0.2)

## 2022-07-31 LAB — BASIC METABOLIC PANEL
Anion gap: 11 (ref 5–15)
BUN: 21 mg/dL — ABNORMAL HIGH (ref 6–20)
CO2: 22 mmol/L (ref 22–32)
Calcium: 8.7 mg/dL — ABNORMAL LOW (ref 8.9–10.3)
Chloride: 106 mmol/L (ref 98–111)
Creatinine, Ser: 2.46 mg/dL — ABNORMAL HIGH (ref 0.61–1.24)
GFR, Estimated: 31 mL/min — ABNORMAL LOW (ref >60)
Glucose, Bld: 92 mg/dL (ref 70–99)
Potassium: 3.1 mmol/L — ABNORMAL LOW (ref 3.5–5.1)
Sodium: 139 mmol/L (ref 135–145)

## 2022-07-31 LAB — URINALYSIS, ROUTINE W REFLEX MICROSCOPIC
Bacteria, UA: NONE SEEN
Bilirubin Urine: NEGATIVE
Glucose, UA: NEGATIVE mg/dL
Ketones, ur: NEGATIVE mg/dL
Leukocytes,Ua: NEGATIVE
Nitrite: NEGATIVE
Protein, ur: 100 mg/dL — AB
Specific Gravity, Urine: 1.01 (ref 1.005–1.030)
pH: 6 (ref 5.0–8.0)

## 2022-07-31 LAB — CREATININE, SERUM
Creatinine, Ser: 2.28 mg/dL — ABNORMAL HIGH (ref 0.61–1.24)
GFR, Estimated: 34 mL/min — ABNORMAL LOW (ref >60)

## 2022-07-31 LAB — TROPONIN I (HIGH SENSITIVITY)
Troponin I (High Sensitivity): 25 ng/L — ABNORMAL HIGH (ref <18)
Troponin I (High Sensitivity): 26 ng/L — ABNORMAL HIGH (ref <18)

## 2022-07-31 LAB — CORTISOL: Cortisol, Plasma: 7.7 ug/dL

## 2022-07-31 LAB — MRSA NEXT GEN BY PCR, NASAL: MRSA by PCR Next Gen: NOT DETECTED

## 2022-07-31 MED ORDER — CLEVIDIPINE BUTYRATE 0.5 MG/ML IV EMUL
0.0000 mg/h | INTRAVENOUS | Status: DC
  Administered 2022-07-31: 12 mg/h via INTRAVENOUS
  Administered 2022-07-31: 2 mg/h via INTRAVENOUS
  Filled 2022-07-31 (×3): qty 50

## 2022-07-31 MED ORDER — POLYETHYLENE GLYCOL 3350 17 G PO PACK: 17.0000 g | PACK | Freq: Every day | ORAL | Status: DC | PRN

## 2022-07-31 MED ORDER — DOCUSATE SODIUM 100 MG PO CAPS: 100.0000 mg | ORAL_CAPSULE | Freq: Two times a day (BID) | ORAL | Status: DC | PRN

## 2022-07-31 MED ORDER — HEPARIN SODIUM (PORCINE) 5000 UNIT/ML IJ SOLN
5000.0000 [IU] | Freq: Three times a day (TID) | INTRAMUSCULAR | Status: DC
  Administered 2022-07-31 – 2022-08-03 (×8): 5000 [IU] via SUBCUTANEOUS
  Filled 2022-07-31 (×8): qty 1

## 2022-07-31 MED ORDER — NITROGLYCERIN 2 % TD OINT
0.5000 [in_us] | TOPICAL_OINTMENT | Freq: Once | TRANSDERMAL | Status: AC
  Administered 2022-07-31: 0.5 [in_us] via TOPICAL
  Filled 2022-07-31: qty 1

## 2022-07-31 MED ORDER — AMLODIPINE BESYLATE-VALSARTAN 10-320 MG PO TABS: 1.0000 | ORAL_TABLET | Freq: Every day | ORAL | Status: DC

## 2022-07-31 MED ORDER — ROSUVASTATIN CALCIUM 20 MG PO TABS
20.0000 mg | ORAL_TABLET | Freq: Every day | ORAL | Status: DC
  Administered 2022-08-01 – 2022-08-03 (×3): 20 mg via ORAL
  Filled 2022-07-31 (×3): qty 1

## 2022-07-31 MED ORDER — CHLORHEXIDINE GLUCONATE CLOTH 2 % EX PADS
6.0000 | MEDICATED_PAD | Freq: Every day | CUTANEOUS | Status: DC
  Administered 2022-07-31 – 2022-08-01 (×2): 6 via TOPICAL

## 2022-07-31 MED ORDER — MELOXICAM 15 MG PO TABS: 15.0000 mg | ORAL_TABLET | Freq: Every day | ORAL | Status: DC

## 2022-07-31 MED ORDER — POTASSIUM CHLORIDE CRYS ER 20 MEQ PO TBCR
40.0000 meq | EXTENDED_RELEASE_TABLET | Freq: Two times a day (BID) | ORAL | Status: AC
  Administered 2022-07-31 – 2022-08-01 (×2): 40 meq via ORAL
  Filled 2022-07-31 (×2): qty 2

## 2022-07-31 MED ORDER — ONDANSETRON HCL 4 MG/2ML IJ SOLN
4.0000 mg | Freq: Four times a day (QID) | INTRAMUSCULAR | Status: DC | PRN
  Administered 2022-07-31: 4 mg via INTRAVENOUS
  Filled 2022-07-31: qty 2

## 2022-07-31 MED ORDER — IRBESARTAN 300 MG PO TABS
300.0000 mg | ORAL_TABLET | Freq: Every day | ORAL | Status: DC
  Administered 2022-07-31 – 2022-08-03 (×4): 300 mg via ORAL
  Filled 2022-07-31 (×4): qty 1

## 2022-07-31 MED ORDER — CLEVIDIPINE BUTYRATE 0.5 MG/ML IV EMUL
0.0000 mg/h | INTRAVENOUS | Status: DC
  Administered 2022-08-01 (×2): 12 mg/h via INTRAVENOUS
  Administered 2022-08-01: 6 mg/h via INTRAVENOUS
  Filled 2022-07-31 (×4): qty 100

## 2022-07-31 MED ORDER — LEVOCETIRIZINE DIHYDROCHLORIDE 5 MG PO TABS: 5.0000 mg | ORAL_TABLET | Freq: Every evening | ORAL | Status: DC

## 2022-07-31 MED ORDER — AZELASTINE HCL 0.1 % NA SOLN
2.0000 | Freq: Two times a day (BID) | NASAL | Status: DC
  Administered 2022-08-01 – 2022-08-03 (×4): 2 via NASAL
  Filled 2022-07-31 (×2): qty 30

## 2022-07-31 MED ORDER — AMLODIPINE BESYLATE 10 MG PO TABS
10.0000 mg | ORAL_TABLET | Freq: Every day | ORAL | Status: DC
  Administered 2022-07-31: 10 mg via ORAL
  Filled 2022-07-31: qty 2

## 2022-07-31 MED ORDER — HYDROCHLOROTHIAZIDE 25 MG PO TABS
25.0000 mg | ORAL_TABLET | Freq: Every day | ORAL | Status: DC
  Administered 2022-08-01 – 2022-08-03 (×3): 25 mg via ORAL
  Filled 2022-07-31 (×3): qty 1

## 2022-07-31 MED ORDER — HYDRALAZINE HCL 20 MG/ML IJ SOLN
10.0000 mg | Freq: Once | INTRAMUSCULAR | Status: AC
  Administered 2022-07-31: 10 mg via INTRAVENOUS
  Filled 2022-07-31: qty 1

## 2022-07-31 NOTE — Progress Notes (Addendum)
eLink Physician-Brief Progress Note Patient Name: Bruce Harris DOB: 07/16/1970 MRN: [id number]   Date of Service  07/31/2022  HPI/Events of Note  Notified of nausea, difficulty breathing and chest discomfort.  Pt given zofran. Pt felt better after vomiting.  He feels relieved now.  He still has some headache.   BP 171/99, HR 91, 94% on RA.  eICU Interventions  Get EKG now.  Ok to discotinue nitropaste.  Continue with zofran PRN.      Intervention Category Intermediate Interventions: Pain - evaluation and management  Larinda Buttery 07/31/2022, 9:12 PM  4:14 AM EKG showed sinus rhythm, T wave inversions in the lateral leads similar to prior EKG.  No ST elevations.  Qtc 500. Pt feels better this morning.  No recurrence of chest discomfort.   Plan> Discontinue zofran. Switch to compazine PRN.

## 2022-07-31 NOTE — ED Notes (Signed)
Patient transported to x-ray. ?

## 2022-07-31 NOTE — Progress Notes (Signed)
Full attestation to follow. Missed last 2 days of BP meds BP control, echo, renal artery duplex, renin/aldosterone. Hx and exam not c/w aortic syndrome, if recurrence may need to consider scan.  Myrla Halsted MD PCCM

## 2022-07-31 NOTE — Progress Notes (Signed)
eLink Physician-Brief Progress Note Patient Name: PANKAJ HOUCHEN DOB: [birth year] MRN: [id number]   Date of Service  07/31/2022  HPI/Events of Note  51/M with HTN, cardiomyopathy, dyslipidemia, presenting with headache and noted to be hypertensive with BP 232/141.  He has not taken BP meds in 2 days.  He was given nitropaste, hydralazine and PO meds and subsequently placed on cleviprex.   BP 170s, HR 92, RR 21, O2 sats 94% Pt is awake and alert.   eICU Interventions  Continue cleviprex to keep BP 170-180 for the first 24 hours.  Replete K.  Start oral antihypertensives. Notified by pharmacy that patient does not take mobic and xyzal which were both discontinued. Heparin for DVT prophylaxis.      Intervention Category Evaluation Type: New Patient Evaluation  Larinda Buttery 07/31/2022, 8:18 PM

## 2022-07-31 NOTE — H&P (Signed)
NAME:  Bruce Harris, MRN:  [id number], DOB:  1970-04-12, LOS: 0 ADMISSION DATE:  07/31/2022, CONSULTATION DATE:  07/31/22 REFERRING MD:  Rodena Medin EM, CHIEF COMPLAINT:  HA, chest pain   History of Present Illness:  52 yo M PMH HTN, reported hx of cardiomyopathy, HLD who presented to ED 5/29  with CC HA. Noticed HA started a few days ago, has worsened. Checked BP at home and was high, usually runs 130s 140s, he reports to me no missed meds, no recent med changes (chart review w note that pt stated to other team member has not taken BP meds in 2days). Denies illicit substance use.  He denies chest pain for me, but endorsed to other provider. Endorses blurry vision which is improving   Evidence of renal dysfunction on initial labs  CT H fortunately ok   Received hydral nitro paste and some PO meds in ED without success, started on clevi and PCCM called for admission   Pertinent  Medical History  HTN   Significant Hospital Events: Including procedures, antibiotic start and stop dates in addition to other pertinent events   5/29 admit for HTN emergency   Interim History / Subjective:  Starting on clevi   Objective   Blood pressure (!) 210/136, pulse 94, temperature 98.5 F (36.9 C), temperature source Oral, resp. rate 19, height 5\' 9"  (1.753 m), weight 102 kg, SpO2 95 %.       No intake or output data in the 24 hours ending 07/31/22 1848 Filed Weights   07/31/22 1413 07/31/22 1416  Weight: 102.1 kg 102 kg    Examination: General: wdwn middle aged M NAD  HENT: NCAT pink mm  Lungs: CTAb  Cardiovascular: s1s2 no rgm  Abdomen: soft ndnt normoactive  Extremities: no acute joint deformity no pitting edema  Neuro: AAOx4 GU: defer  Resolved Hospital Problem list     Assessment & Plan:   Hypertensive emergency  Chest pain  HA  AKI Hypokalemia OSA -trops unremarkable not cw ACS  -quality of chest pain not c/w dissection, pain is also self limiting -variable hx re home med  compliance  P -starting on cleviprex SBP goal 170-190 -admit to ICU -add home POs tomorrow -Renal artery [name] tomorrow -- NPO at [facility] for this -ECHO -UDS  -replace K  -check renin aldosterone   Best Practice (right click and "Reselect all SmartList Selections" daily)   Diet/type: Regular consistency (see orders) DVT prophylaxis: prophylactic heparin  GI prophylaxis: N/A Lines: N/A Foley:  N/A Code Status:  full code Last date of multidisciplinary goals of care discussion [--]  Labs   CBC: Recent Labs  Lab 07/31/22 1432  WBC 6.1  HGB 13.6  HCT 44.2  MCV 80.8  PLT 273    Basic Metabolic Panel: Recent Labs  Lab 07/31/22 1432  NA 139  K 3.1*  CL 106  CO2 22  GLUCOSE 92  BUN 21*  CREATININE 2.46*  CALCIUM 8.7*   GFR: Estimated Creatinine Clearance: 41.8 mL/min (A) (by C-G formula based on SCr of 2.46 mg/dL (H)). Recent Labs  Lab 07/31/22 1432  WBC 6.1    Liver Function Tests: No results for input(s): "AST", "ALT", "ALKPHOS", "BILITOT", "PROT", "ALBUMIN" in the last 168 hours. No results for input(s): "LIPASE", "AMYLASE" in the last 168 hours. No results for input(s): "AMMONIA" in the last 168 hours.  ABG No results found for: "PHART", "PCO2ART", "PO2ART", "HCO3", "TCO2", "ACIDBASEDEF", "O2SAT"   Coagulation Profile: No results for input(s): "INR", "  PROTIME" in the last 168 hours.  Cardiac Enzymes: No results for input(s): "CKTOTAL", "CKMB", "CKMBINDEX", "TROPONINI" in the last 168 hours.  HbA1C: Hgb A1c MFr Bld  Date/Time Value Ref Range Status  03/12/2022 10:01 AM 6.3 4.6 - 6.5 % Final    Comment:    Glycemic Control Guidelines for People with Diabetes:Non Diabetic:  <6%Goal of Therapy: <7%Additional Action Suggested:  >8%   10/18/2016 10:10 AM 6.1 4.6 - 6.5 % Final    Comment:    Glycemic Control Guidelines for People with Diabetes:Non Diabetic:  <6%Goal of Therapy: <7%Additional Action Suggested:  >8%     CBG: No results for input(s): "GLUCAP"  in the last 168 hours.  Review of Systems:   As per HPI   Past Medical History:  He,  has a past medical history of Cardiomyopathy, Chicken pox, Gout, Hyperglycemia, Hyperlipidemia, Hypertension, Hypogonadism in male, OSA (obstructive sleep apnea) (05/19/2017), and Toxic effect of venom of bees.   Surgical History:   Past Surgical History:  Procedure Laterality Date   HAND SURGERY     Right     Social History:   reports that he has never smoked. He has never used smokeless tobacco. He reports current alcohol use. He reports that he does not use drugs.   Family History:  His family history includes Coronary artery disease in his mother; Diabetes in his maternal grandmother and mother; Healthy in his brother, sister, and son; Heart disease in his father; Hyperlipidemia in his mother; Hypertension in his father and mother; Kidney failure in his maternal grandmother.   Allergies Allergies  Allergen Reactions   Bee Venom Swelling     Home Medications  Prior to Admission medications   Medication Sig Start Date End Date Taking? Authorizing Provider  amLODipine-valsartan (EXFORGE) 10-320 MG tablet TAKE 1 TABLET BY MOUTH ONCE DAILY 10/01/21 10/01/22 Yes Wendling, Jilda Roche, DO  azelastine (ASTELIN) 0.1 % nasal spray Place 2 sprays into both nostrils 2 (two) times daily. 10/16/19  Yes Ardith Dark, MD  EPINEPHrine (EPIPEN 2-PAK) 0.3 mg/0.3 mL IJ SOAJ injection Inject 0.3 mLs (0.3 mg total) into the muscle as needed for anaphylaxis. 09/01/19  Yes Sharlene Dory, DO  hydrochlorothiazide (HYDRODIURIL) 25 MG tablet Take 1 tablet (25 mg total) by mouth daily. 02/19/22  Yes Sharlene Dory, DO  rosuvastatin (CRESTOR) 20 MG tablet Take 1 tablet (20 mg total) by mouth daily. 03/12/22  Yes Sharlene Dory, DO  fluticasone (FLONASE) 50 MCG/ACT nasal spray Place 2 sprays into both nostrils daily. Patient not taking: Reported on 07/31/2022 11/28/20   Saguier, Ramon Dredge, PA-C   fluticasone [facility]) 50 MCG/ACT nasal spray Place 2 sprays into both nostrils daily. Patient not taking: Reported on 07/31/2022 11/28/20   Saguier, Ramon Dredge, PA-C  levocetirizine (XYZAL) 5 MG tablet Take 1 tablet (5 mg total) by mouth every evening. Patient not taking: Reported on 07/31/2022 06/17/18   Sharlene Dory, DO  meloxicam (MOBIC) 15 MG tablet TAKE 1 TABLET(15 MG) BY MOUTH DAILY Patient not taking: Reported on 07/31/2022 02/12/21   Sharlene Dory, DO  tiZANidine (ZANAFLEX) 4 MG tablet TAKE 1 TABLET(4 MG) BY MOUTH EVERY 6 HOURS AS NEEDED FOR MUSCLE SPASMS Patient not taking: Reported on 07/31/2022 03/15/22   Sharlene Dory, DO     Critical care time: 37 min      CRITICAL CARE Performed by: Lanier Clam   Total critical care time: 37 minutes  Critical care time was exclusive of separately billable  procedures and treating other patients.  Critical care was necessary to treat or prevent imminent or life-threatening deterioration.  Critical care was time spent personally by me on the following activities: development of treatment plan with patient and/or surrogate as well as nursing, discussions with consultants, evaluation of patient's response to treatment, examination of patient, obtaining history from patient or surrogate, ordering and performing treatments and interventions, ordering and review of laboratory studies, ordering and review of radiographic studies, pulse oximetry and re-evaluation of patient's condition.  Tessie Fass MSN, AGACNP-BC [place] Amion for pager 07/31/2022, 7:00 PM

## 2022-07-31 NOTE — ED Notes (Signed)
[facility] says they will do exam tomorrow- as pt needs to be NPO for at least 8 hours before

## 2022-07-31 NOTE — ED Notes (Signed)
ED TO INPATIENT HANDOFF REPORT  Name/Age/Gender Bruce Harris 52 y.o. male  Code Status    Code Status Orders  (From admission, onward)           Start     Ordered   07/31/22 1846  Full code  Continuous       Question:  By:  Answer:  Consent: discussion documented in EHR   07/31/22 1847           Code Status History     This patient has a current code status but no historical code status.       Home/SNF/Other Home  Chief Complaint Hypertensive urgency [I16.0]  Level of Care/Admitting Diagnosis ED Disposition     ED Disposition  Admit   Condition  --   Comment  Hospital Area: [facility] [[id number]]  Level of Care: ICU [6]  May admit patient to [facility] or [facility] if equivalent level of care is available:: Yes  Covid Evaluation: Asymptomatic - no recent exposure (last 10 days) testing not required  Diagnosis: Hypertensive urgency [650126]  Admitting Physician: Lorin Glass [[id number]]  Attending Physician: Lorin Glass [[id number]]  Certification:: I certify this patient will need inpatient services for at least 2 midnights  Estimated Length of Stay: 4          Medical History Past Medical History:  Diagnosis Date   Cardiomyopathy    Chicken pox    Gout    Hyperglycemia    Hyperlipidemia    Hypertension    Hypogonadism in male    OSA (obstructive sleep apnea) 05/19/2017   Toxic effect of venom of bees     Allergies Allergies  Allergen Reactions   Bee Venom Swelling    IV Location/Drains/Wounds Patient Lines/Drains/Airways Status     Active Line/Drains/Airways     Name Placement date Placement time Site Days   Peripheral IV 07/31/22 20 G Left;Posterior Hand 07/31/22  1442  Hand  less than 1            Labs/Imaging Results for orders placed or performed during the hospital encounter of 07/31/22 (from the past 48 hour(s))  Basic metabolic panel     Status: Abnormal   Collection Time: 07/31/22   2:32 PM  Result Value Ref Range   Sodium 139 135 - 145 mmol/L   Potassium 3.1 (L) 3.5 - 5.1 mmol/L   Chloride 106 98 - 111 mmol/L   CO2 22 22 - 32 mmol/L   Glucose, Bld 92 70 - 99 mg/dL    Comment: Glucose reference range applies only to samples taken after fasting for at least 8 hours.   BUN 21 (H) 6 - 20 mg/dL   Creatinine, Ser 6.21 (H) 0.61 - 1.24 mg/dL   Calcium 8.7 (L) 8.9 - 10.3 mg/dL   GFR, Estimated 31 (L) >60 mL/min    Comment: (NOTE) Calculated using the CKD-EPI Creatinine Equation (2021)    Anion gap 11 5 - 15    Comment: Performed at [facility], [address] [place], [place] [address]  CBC     Status: Abnormal   Collection Time: 07/31/22  2:32 PM  Result Value Ref Range   WBC 6.1 4.0 - 10.5 K/uL   RBC 5.47 4.22 - 5.81 MIL/uL   Hemoglobin 13.6 13.0 - 17.0 g/dL   HCT 78.4 69.6 - 29.5 %   MCV 80.8 80.0 - 100.0 fL   MCH 24.9 (L) 26.0 -  34.0 pg   MCHC 30.8 30.0 - 36.0 g/dL   RDW 16.1 09.6 - 04.5 %   Platelets 273 150 - 400 K/uL   nRBC 0.0 0.0 - 0.2 %    Comment: Performed at [facility], [address] [place], [place] [address]  Troponin I (High Sensitivity)     Status: Abnormal   Collection Time: 07/31/22  2:32 PM  Result Value Ref Range   Troponin I (High Sensitivity) 25 (H) <18 ng/L    Comment: (NOTE) Elevated high sensitivity troponin I (hsTnI) values and significant  changes across serial measurements may suggest ACS but many other  chronic and acute conditions are known to elevate hsTnI results.  Refer to the "Links" section for chest pain algorithms and additional  guidance. Performed at [facility], [address] [place], [place] [address]   Urinalysis, Routine w reflex microscopic -Urine, Clean Catch     Status: Abnormal   Collection Time: 07/31/22  3:12 PM  Result Value Ref Range   Color, Urine STRAW (A) YELLOW   APPearance CLEAR CLEAR   Specific Gravity, Urine 1.010 1.005 - 1.030    pH 6.0 5.0 - 8.0   Glucose, UA NEGATIVE NEGATIVE mg/dL   Hgb urine dipstick SMALL (A) NEGATIVE   Bilirubin Urine NEGATIVE NEGATIVE   Ketones, ur NEGATIVE NEGATIVE mg/dL   Protein, ur 829 (A) NEGATIVE mg/dL   Nitrite NEGATIVE NEGATIVE   Leukocytes,Ua NEGATIVE NEGATIVE   RBC / HPF 0-5 0 - 5 RBC/hpf   WBC, UA 0-5 0 - 5 WBC/hpf   Bacteria, UA NONE SEEN NONE SEEN   Squamous Epithelial / HPF 0-5 0 - 5 /HPF   Mucus PRESENT     Comment: Performed at [facility], [address], [place], [place] [address]  Troponin I (High Sensitivity)     Status: Abnormal   Collection Time: 07/31/22  4:24 PM  Result Value Ref Range   Troponin I (High Sensitivity) 26 (H) <18 ng/L    Comment: (NOTE) Elevated high sensitivity troponin I (hsTnI) values and significant  changes across serial measurements may suggest ACS but many other  chronic and acute conditions are known to elevate hsTnI results.  Refer to the "Links" section for chest pain algorithms and additional  guidance. Performed at [facility], [address] [place], [place] [address]    CT Head Wo Contrast  Result Date: 07/31/2022 CLINICAL DATA:  Right arm numbness and tingling, headache and blurred vision EXAM: CT HEAD WITHOUT CONTRAST TECHNIQUE: Contiguous axial images were obtained from the base of the skull through the vertex without intravenous contrast. RADIATION DOSE REDUCTION: This exam was performed according to the departmental dose-optimization program which includes automated exposure control, adjustment of the mA and/or kV according to patient size and/or use of iterative reconstruction technique. COMPARISON:  None Available. FINDINGS: Brain: No evidence of acute infarction, hemorrhage, hydrocephalus, extra-axial collection or mass lesion/mass effect. Vascular: No hyperdense vessel or unexpected calcification. Skull: Normal. Negative for fracture or focal lesion. Sinuses/Orbits: No acute finding.  Chronic depressed fracture of the left lamina papyracea (series 3, image 47) Other: None. IMPRESSION: 1. No acute intracranial pathology. 2. Chronic depressed fracture of the left lamina papyracea. Electronically Signed   By: Jearld Lesch M.D.   On: 07/31/2022 16:23   DG Chest 2 View  Result Date: 07/31/2022 CLINICAL DATA:  Chest pain. EXAM: CHEST - 2 VIEW COMPARISON:  None Available. FINDINGS: Clear lungs. Normal heart  size and mediastinal contours. No pleural effusion or pneumothorax. Visualized bones and upper abdomen are unremarkable. IMPRESSION: No evidence of acute cardiopulmonary disease. Electronically Signed   By: Orvan Falconer M.D.   On: 07/31/2022 15:45    Pending Labs Unresulted Labs (From admission, onward)     Start     Ordered   08/01/22 0500  Basic metabolic panel  Tomorrow morning,   R        07/31/22 1847   07/31/22 1849  Rapid urine drug screen (hospital performed)  Add-on,   AD        07/31/22 1848   07/31/22 1846  HIV Antibody (routine testing w rflx)  (HIV Antibody (Routine testing w reflex) panel)  Once,   R        07/31/22 1847   07/31/22 1846  CBC  (heparin)  Once,   R       Comments: Baseline for heparin therapy IF NOT ALREADY DRAWN.  Notify MD if PLT < 100 K.    07/31/22 1847   07/31/22 1846  Creatinine, serum  (heparin)  Once,   R       Comments: Baseline for heparin therapy IF NOT ALREADY DRAWN.    07/31/22 1847   07/31/22 1846  Cortisol  ONCE - URGENT,   URGENT        07/31/22 1847            Vitals/Pain Today's Vitals   07/31/22 1730 07/31/22 1800 07/31/22 1824 07/31/22 1830  BP: (!) 208/124 (!) 218/137  (!) 210/136  Pulse: 90 95 (!) 102 94  Resp: 18 20 20 19   Temp:   98.5 F (36.9 C)   TempSrc:   Oral   SpO2: 97% 95% 98% 95%  Weight:      Height:      PainSc:        Isolation Precautions No active isolations  Medications Medications  amLODipine (NORVASC) tablet 10 mg (10 mg Oral Given 07/31/22 1515)    And  irbesartan (AVAPRO)  tablet 300 mg (300 mg Oral Given 07/31/22 1518)  clevidipine (CLEVIPREX) infusion 0.5 mg/mL (2 mg/hr Intravenous New Bag/Given 07/31/22 1850)  docusate sodium (COLACE) capsule 100 mg (has no administration in time range)  polyethylene glycol (MIRALAX / GLYCOLAX) packet 17 g (has no administration in time range)  heparin injection 5,000 Units (has no administration in time range)  ondansetron (ZOFRAN) injection 4 mg (has no administration in time range)  hydrALAZINE (APRESOLINE) injection 10 mg (10 mg Intravenous Given 07/31/22 1643)  nitroGLYCERIN (NITROGLYN) 2 % ointment 0.5 inch (0.5 inches Topical Given 07/31/22 1730)    Mobility walks

## 2022-07-31 NOTE — ED Triage Notes (Addendum)
C/o centralized chest pain radiating to right arm with numbness and tingling, headache, and blurred vision.  Chest pain, headache, and blurred vision started yesterday and moved to right arm at 0500.  No bp meds in 2 days.

## 2022-07-31 NOTE — ED Provider Notes (Signed)
[place] EMERGENCY DEPARTMENT AT [facility] Provider Note   CSN: [id number] Arrival date & time: 07/31/22  1403     History  Chief Complaint  Patient presents with   Headache   Chest Pain   HPI Bruce Harris is a 52 y.o. male with hypertension and hyperlipidemia presenting for chest pain and headache.  Chest pain started this morning around 5 AM.  Is located at the center of his chest and radiates slightly to the right.  Also endorsed some right arm numbness which started around the same time as the chest pain.  Numbness is since subsided but chest pain still present.  Also started having a headache yesterday with some visual worse in the right eye.  Visual disturbance has somewhat improved since yesterday.  Patient states he has been taking his HCTZ but not been taking his Exforge for the past 2 days for hypertension due to inability to refill his prescription.  Denies urinary changes.   Headache Chest Pain Associated symptoms: headache        Home Medications Prior to Admission medications   Medication Sig Start Date End Date Taking? Authorizing Provider  amLODipine-valsartan (EXFORGE) 10-320 MG tablet TAKE 1 TABLET BY MOUTH ONCE DAILY 10/01/21 10/01/22 Yes Wendling, Jilda Roche, DO  azelastine (ASTELIN) 0.1 % nasal spray Place 2 sprays into both nostrils 2 (two) times daily. 10/16/19  Yes Ardith Dark, MD  EPINEPHrine (EPIPEN 2-PAK) 0.3 mg/0.3 mL IJ SOAJ injection Inject 0.3 mLs (0.3 mg total) into the muscle as needed for anaphylaxis. 09/01/19  Yes Sharlene Dory, DO  hydrochlorothiazide (HYDRODIURIL) 25 MG tablet Take 1 tablet (25 mg total) by mouth daily. 02/19/22  Yes Sharlene Dory, DO  rosuvastatin (CRESTOR) 20 MG tablet Take 1 tablet (20 mg total) by mouth daily. 03/12/22  Yes Sharlene Dory, DO  fluticasone (FLONASE) 50 MCG/ACT nasal spray Place 2 sprays into both nostrils daily. Patient not taking: Reported on 07/31/2022  11/28/20   Saguier, Ramon Dredge, PA-C  fluticasone [facility]) 50 MCG/ACT nasal spray Place 2 sprays into both nostrils daily. Patient not taking: Reported on 07/31/2022 11/28/20   Saguier, Ramon Dredge, PA-C  levocetirizine (XYZAL) 5 MG tablet Take 1 tablet (5 mg total) by mouth every evening. Patient not taking: Reported on 07/31/2022 06/17/18   Sharlene Dory, DO  meloxicam (MOBIC) 15 MG tablet TAKE 1 TABLET(15 MG) BY MOUTH DAILY Patient not taking: Reported on 07/31/2022 02/12/21   Sharlene Dory, DO  tiZANidine (ZANAFLEX) 4 MG tablet TAKE 1 TABLET(4 MG) BY MOUTH EVERY 6 HOURS AS NEEDED FOR MUSCLE SPASMS Patient not taking: Reported on 07/31/2022 03/15/22   Sharlene Dory, DO      Allergies    Bee venom    Review of Systems   Review of Systems  Cardiovascular:  Positive for chest pain.  Neurological:  Positive for headaches.    Physical Exam   Vitals:   07/31/22 1824 07/31/22 1830  BP:  (!) 210/136  Pulse: (!) 102 94  Resp: 20 19  Temp: 98.5 F (36.9 C)   SpO2: 98% 95%    CONSTITUTIONAL:  well-appearing, NAD NEURO:  GCS 15. Speech is goal oriented. No deficits appreciated to CN III-XII; symmetric eyebrow raise, no facial drooping, tongue midline. Patient has equal grip strength bilaterally with 5/5 strength against resistance in all major muscle groups bilaterally. Sensation to light touch intact. Patient moves extremities without ataxia. Normal finger-nose-finger. Patient ambulatory with steady gait. EYES:  eyes equal  and reactive ENT/NECK:  Supple, no stridor CARDIO: Regular rate and rhythm, appears well-perfused  PULM:  No respiratory distress, CTAB GI/GU:  non-distended, soft MSK/SPINE:  No gross deformities, no edema, moves all extremities  SKIN:  no rash, atraumatic  *Additional and/or pertinent findings included in MDM below   ED Results / Procedures / Treatments   Labs (all labs ordered are listed, but only abnormal results are displayed) Labs Reviewed   BASIC METABOLIC PANEL - Abnormal; Notable for the following components:      Result Value   Potassium 3.1 (*)    BUN 21 (*)    Creatinine, Ser 2.46 (*)    Calcium 8.7 (*)    GFR, Estimated 31 (*)    All other components within normal limits  CBC - Abnormal; Notable for the following components:   MCH 24.9 (*)    All other components within normal limits  URINALYSIS, ROUTINE W REFLEX MICROSCOPIC - Abnormal; Notable for the following components:   Color, Urine STRAW (*)    Hgb urine dipstick SMALL (*)    Protein, ur 100 (*)    All other components within normal limits  TROPONIN I (HIGH SENSITIVITY) - Abnormal; Notable for the following components:   Troponin I (High Sensitivity) 25 (*)    All other components within normal limits  TROPONIN I (HIGH SENSITIVITY) - Abnormal; Notable for the following components:   Troponin I (High Sensitivity) 26 (*)    All other components within normal limits  HIV ANTIBODY (ROUTINE TESTING W REFLEX)  CBC  CREATININE, SERUM  CORTISOL  BASIC METABOLIC PANEL  RAPID URINE DRUG SCREEN, HOSP PERFORMED    EKG EKG Interpretation  Date/Time:  Wednesday Jul 31 2022 14:12:34 EDT Ventricular Rate:  89 PR Interval:  163 QRS Duration: 105 QT Interval:  373 QTC Calculation: 454 R Axis:   -43 Text Interpretation: Sinus rhythm Left axis deviation RSR' in V1 or V2, probably normal variant Nonspecific T abnormalities, lateral leads Confirmed by Kristine Royal [id number]) on 07/31/2022 3:07:58 PM  Radiology CT Head Wo Contrast  Result Date: 07/31/2022 CLINICAL DATA:  Right arm numbness and tingling, headache and blurred vision EXAM: CT HEAD WITHOUT CONTRAST TECHNIQUE: Contiguous axial images were obtained from the base of the skull through the vertex without intravenous contrast. RADIATION DOSE REDUCTION: This exam was performed according to the departmental dose-optimization program which includes automated exposure control, adjustment of the mA and/or kV  according to patient size and/or use of iterative reconstruction technique. COMPARISON:  None Available. FINDINGS: Brain: No evidence of acute infarction, hemorrhage, hydrocephalus, extra-axial collection or mass lesion/mass effect. Vascular: No hyperdense vessel or unexpected calcification. Skull: Normal. Negative for fracture or focal lesion. Sinuses/Orbits: No acute finding. Chronic depressed fracture of the left lamina papyracea (series 3, image 47) Other: None. IMPRESSION: 1. No acute intracranial pathology. 2. Chronic depressed fracture of the left lamina papyracea. Electronically Signed   By: Jearld Lesch M.D.   On: 07/31/2022 16:23   DG Chest 2 View  Result Date: 07/31/2022 CLINICAL DATA:  Chest pain. EXAM: CHEST - 2 VIEW COMPARISON:  None Available. FINDINGS: Clear lungs. Normal heart size and mediastinal contours. No pleural effusion or pneumothorax. Visualized bones and upper abdomen are unremarkable. IMPRESSION: No evidence of acute cardiopulmonary disease. Electronically Signed   By: Orvan Falconer M.D.   On: 07/31/2022 15:45    Procedures .Critical Care  Performed by: Gareth Eagle, PA-C Authorized by: Gareth Eagle, PA-C   Critical care provider  statement:    Critical care time (minutes):  30   Critical care was necessary to treat or prevent imminent or life-threatening deterioration of the following conditions:  Renal failure (hypertensive emergency)   Critical care was time spent personally by me on the following activities:  Development of treatment plan with patient or surrogate, discussions with consultants, evaluation of patient's response to treatment, examination of patient, ordering and review of laboratory studies, ordering and review of radiographic studies, ordering and performing treatments and interventions, pulse oximetry, re-evaluation of patient's condition and review of old charts Comments:     Hypertensive gtt. for BP control.  Cleviprex     Medications  Ordered in ED Medications  amLODipine (NORVASC) tablet 10 mg (10 mg Oral Given 07/31/22 1515)    And  irbesartan (AVAPRO) tablet 300 mg (300 mg Oral Given 07/31/22 1518)  clevidipine (CLEVIPREX) infusion 0.5 mg/mL (has no administration in time range)  docusate sodium (COLACE) capsule 100 mg (has no administration in time range)  polyethylene glycol (MIRALAX / GLYCOLAX) packet 17 g (has no administration in time range)  heparin injection 5,000 Units (has no administration in time range)  ondansetron (ZOFRAN) injection 4 mg (has no administration in time range)  hydrALAZINE (APRESOLINE) injection 10 mg (10 mg Intravenous Given 07/31/22 1643)  nitroGLYCERIN (NITROGLYN) 2 % ointment 0.5 inch (0.5 inches Topical Given 07/31/22 1730)    ED Course/ Medical Decision Making/ A&P Clinical Course as of 07/31/22 1850  Wed Jul 31, 2022  1704 BMI (Calculated): 33.21 [JR]    Clinical Course User Index [JR] Gareth Eagle, PA-C                             Medical Decision Making Amount and/or Complexity of Data Reviewed Labs: ordered. Radiology: ordered.  Risk Prescription drug management.   Initial Impression and Ddx 51 year old well-appearing male presenting for chest pain and headache.  Exam was unremarkable.  DDx includes hypertensive emergency, stroke, ACS, renal failure. Patient PMH that increases complexity of ED encounter: hypertension and hyperlipidemia  Interpretation of Diagnostics - I independent reviewed and interpreted the labs as followed: Elevated troponin:  First was 25, second was 26.  Elevated BUN and creatinine.  Hematuria and proteinuria.  - I independently visualized the following imaging with scope of interpretation limited to determining acute life threatening conditions related to emergency care: CT head, CXR, which revealed no acute findings  -EKG revealed sinus rhythm but with some non-specific T wave abnormalities  Patient Reassessment and Ultimate  Disposition/Management Initially treated blood pressure with hydralazine then later Nitropaste. Systolic pressure remained in the 210s.  With elevated troponins, AKI and endorse headache and visual disturbance, primary concern was hypertensive emergency.  Did consider ACS but unlikely given negative troponin and patient stated that after blood pressure improved slightly had this chest pain completely resolved. Reached out to Dr. Myrla Halsted of critical care who advised to start him on Cleviprex and agreed to accept him to the ICU. Also considered stroke but unlikely given negative CT and no focal neurodeficits.   Patient management required discussion with the following services or consulting groups:  Intensivist Service  Complexity of Problems Addressed Acute complicated illness or Injury  Additional Data Reviewed and Analyzed Further history obtained from: Further history from spouse/family member, Past medical history and medications listed in the EMR, and Prior ED visit notes  Patient Encounter Risk Assessment Consideration of hospitalization  Final Clinical Impression(s) / ED Diagnoses Final diagnoses:  Hypertensive emergency    Rx / DC Orders ED Discharge Orders     None         Gareth Eagle, PA-C 07/31/22 1850    Wynetta Fines, MD 08/03/22 [phone]

## 2022-08-01 ENCOUNTER — Inpatient Hospital Stay (HOSPITAL_COMMUNITY): Payer: 59

## 2022-08-01 DIAGNOSIS — I10 Essential (primary) hypertension: Secondary | ICD-10-CM | POA: Diagnosis not present

## 2022-08-01 DIAGNOSIS — N179 Acute kidney failure, unspecified: Secondary | ICD-10-CM | POA: Diagnosis not present

## 2022-08-01 DIAGNOSIS — E876 Hypokalemia: Secondary | ICD-10-CM | POA: Diagnosis not present

## 2022-08-01 DIAGNOSIS — I161 Hypertensive emergency: Secondary | ICD-10-CM | POA: Diagnosis present

## 2022-08-01 DIAGNOSIS — I428 Other cardiomyopathies: Secondary | ICD-10-CM | POA: Diagnosis not present

## 2022-08-01 LAB — CBC
HCT: 43.8 % (ref 39.0–52.0)
Hemoglobin: 13.9 g/dL (ref 13.0–17.0)
MCH: 25.3 pg — ABNORMAL LOW (ref 26.0–34.0)
MCHC: 31.7 g/dL (ref 30.0–36.0)
MCV: 79.8 fL — ABNORMAL LOW (ref 80.0–100.0)
Platelets: 301 10*3/uL (ref 150–400)
RBC: 5.49 MIL/uL (ref 4.22–5.81)
RDW: 15.2 % (ref 11.5–15.5)
WBC: 10.6 10*3/uL — ABNORMAL HIGH (ref 4.0–10.5)
nRBC: 0 % (ref 0.0–0.2)

## 2022-08-01 LAB — RAPID URINE DRUG SCREEN, HOSP PERFORMED
Amphetamines: NOT DETECTED
Barbiturates: NOT DETECTED
Benzodiazepines: NOT DETECTED
Cocaine: NOT DETECTED
Opiates: NOT DETECTED
Tetrahydrocannabinol: NOT DETECTED

## 2022-08-01 LAB — BASIC METABOLIC PANEL
Anion gap: 10 (ref 5–15)
BUN: 24 mg/dL — ABNORMAL HIGH (ref 6–20)
CO2: 23 mmol/L (ref 22–32)
Calcium: 9.2 mg/dL (ref 8.9–10.3)
Chloride: 106 mmol/L (ref 98–111)
Creatinine, Ser: 2.68 mg/dL — ABNORMAL HIGH (ref 0.61–1.24)
GFR, Estimated: 28 mL/min — ABNORMAL LOW (ref >60)
Glucose, Bld: 124 mg/dL — ABNORMAL HIGH (ref 70–99)
Potassium: 3.4 mmol/L — ABNORMAL LOW (ref 3.5–5.1)
Sodium: 139 mmol/L (ref 135–145)

## 2022-08-01 LAB — ECHOCARDIOGRAM COMPLETE
AR max vel: 1.98 cm2
AV Area VTI: 1.76 cm2
AV Area mean vel: 1.78 cm2
AV Mean grad: 7.5 mmHg
AV Peak grad: 11.6 mmHg
Ao pk vel: 1.7 m/s
Area-P 1/2: 3.21 cm2
Calc EF: 65.9 %
Height: 69 in
MV VTI: 2.11 cm2
S' Lateral: 2.4 cm
Single Plane A2C EF: 67 %
Single Plane A4C EF: 66.6 %
Weight: 3647.29 oz

## 2022-08-01 LAB — HIV ANTIBODY (ROUTINE TESTING W REFLEX): HIV Screen 4th Generation wRfx: NONREACTIVE

## 2022-08-01 MED ORDER — CARVEDILOL 12.5 MG PO TABS: 12.5000 mg | ORAL_TABLET | Freq: Two times a day (BID) | ORAL | Status: DC

## 2022-08-01 MED ORDER — PROCHLORPERAZINE EDISYLATE 10 MG/2ML IJ SOLN
10.0000 mg | Freq: Four times a day (QID) | INTRAMUSCULAR | Status: DC | PRN
  Administered 2022-08-02: 10 mg via INTRAVENOUS
  Filled 2022-08-01: qty 2

## 2022-08-01 MED ORDER — AMLODIPINE BESYLATE 10 MG PO TABS
10.0000 mg | ORAL_TABLET | Freq: Every day | ORAL | Status: DC
  Administered 2022-08-01 – 2022-08-03 (×3): 10 mg via ORAL
  Filled 2022-08-01 (×3): qty 1

## 2022-08-01 MED ORDER — CARVEDILOL 12.5 MG PO TABS
12.5000 mg | ORAL_TABLET | Freq: Two times a day (BID) | ORAL | Status: DC
  Administered 2022-08-02: 12.5 mg via ORAL
  Filled 2022-08-01: qty 1

## 2022-08-01 MED ORDER — CARVEDILOL 6.25 MG PO TABS
6.2500 mg | ORAL_TABLET | Freq: Once | ORAL | Status: AC
  Administered 2022-08-01: 6.25 mg via ORAL
  Filled 2022-08-01: qty 1

## 2022-08-01 MED ORDER — CARVEDILOL 6.25 MG PO TABS
6.2500 mg | ORAL_TABLET | Freq: Two times a day (BID) | ORAL | Status: DC
  Administered 2022-08-01 (×2): 6.25 mg via ORAL
  Filled 2022-08-01 (×2): qty 1

## 2022-08-01 MED ORDER — HYDRALAZINE HCL 20 MG/ML IJ SOLN
10.0000 mg | INTRAMUSCULAR | Status: DC | PRN
  Administered 2022-08-01: 20 mg via INTRAVENOUS
  Administered 2022-08-01: 10 mg via INTRAVENOUS
  Filled 2022-08-01 (×2): qty 1

## 2022-08-01 NOTE — Progress Notes (Signed)
Pt refuses cpap

## 2022-08-01 NOTE — Progress Notes (Signed)
NAME:  Bruce Harris, MRN:  [id number], DOB:  1970-08-18, LOS: 1 ADMISSION DATE:  07/31/2022, CONSULTATION DATE:  07/31/22 REFERRING MD:  Rodena Medin EM, CHIEF COMPLAINT:  HA, chest pain   History of Present Illness:  52 yo M PMH HTN, reported hx of cardiomyopathy, HLD who presented to ED 5/29  with CC HA. Noticed HA started a few days ago, has worsened. Checked BP at home and was high, usually runs 130s 140s, he reports to me no missed meds, no recent med changes (chart review w note that pt stated to other team member has not taken BP meds in 2days). Denies illicit substance use.  He denies chest pain for me, but endorsed to other provider. Endorses blurry vision which is improving   Evidence of renal dysfunction on initial labs  CT H fortunately ok   Received hydral nitro paste and some PO meds in ED without success, started on clevi and PCCM called for admission   Pertinent  Medical History  HTN   Significant Hospital Events: Including procedures, antibiotic start and stop dates in addition to other pertinent events   5/29 admit for HTN emergency   Interim History / Subjective:  Overnight SBPs were 140-170s -- typically 150s   K is 3.4 Cr worse at 2.68   He feels "100% better"  Objective   Blood pressure (!) 157/94, pulse 94, temperature 98.2 F (36.8 C), temperature source Oral, resp. rate 16, height 5\' 9"  (1.753 m), weight 103.4 kg, SpO2 98 %.        Intake/Output Summary (Last 24 hours) at 08/01/2022 0844 Last data filed at 08/01/2022 0700 Gross per 24 hour  Intake 247.63 ml  Output --  Net 247.63 ml   Filed Weights   07/31/22 1413 07/31/22 1416 08/01/22 0500  Weight: 102.1 kg 102 kg 103.4 kg    Examination: General: wdwn middle aged M NAD  HENT: NCAT pink mm  Lungs: CTAb on RA  Cardiovascular: rrr cap refill brisk  Abdomen: soft round normoactive  Extremities: no acute joint deformity  Neuro: AAOx4  GU: defer  Resolved Hospital Problem list   Chest  pain HA  Assessment & Plan:   [place] emergency with underlying HTN AKI Hypokalemia OSA  P -wean clevi for goal 170-190  -POs after Renal artery [name]  -replace K  -ECHO  -Have re-ordered the UDS again, not sure why not sent -awaiting renin aldosterone    Best Practice (right click and "Reselect all SmartList Selections" daily)   Diet/type: Regular consistency (see orders) DVT prophylaxis: prophylactic heparin  GI prophylaxis: N/A Lines: N/A Foley:  N/A Code Status:  full code Last date of multidisciplinary goals of care discussion [--]  Labs   CBC: Recent Labs  Lab 07/31/22 1432 08/01/22 0307  WBC 6.1 10.6*  HGB 13.6 13.9  HCT 44.2 43.8  MCV 80.8 79.8*  PLT 273 301    Basic Metabolic Panel: Recent Labs  Lab 07/31/22 1432 07/31/22 1848 08/01/22 0307  NA 139  --  139  K 3.1*  --  3.4*  CL 106  --  106  CO2 22  --  23  GLUCOSE 92  --  124*  BUN 21*  --  24*  CREATININE 2.46* 2.28* 2.68*  CALCIUM 8.7*  --  9.2   GFR: Estimated Creatinine Clearance: 38.7 mL/min (A) (by C-G formula based on SCr of 2.68 mg/dL (H)). Recent Labs  Lab 07/31/22 1432 08/01/22 0307  WBC 6.1 10.6*  Liver Function Tests: No results for input(s): "AST", "ALT", "ALKPHOS", "BILITOT", "PROT", "ALBUMIN" in the last 168 hours. No results for input(s): "LIPASE", "AMYLASE" in the last 168 hours. No results for input(s): "AMMONIA" in the last 168 hours.  ABG No results found for: "PHART", "PCO2ART", "PO2ART", "HCO3", "TCO2", "ACIDBASEDEF", "O2SAT"   Coagulation Profile: No results for input(s): "INR", "PROTIME" in the last 168 hours.  Cardiac Enzymes: No results for input(s): "CKTOTAL", "CKMB", "CKMBINDEX", "TROPONINI" in the last 168 hours.  HbA1C: Hgb A1c MFr Bld  Date/Time Value Ref Range Status  03/12/2022 10:01 AM 6.3 4.6 - 6.5 % Final    Comment:    Glycemic Control Guidelines for People with Diabetes:Non Diabetic:  <6%Goal of Therapy: <7%Additional Action Suggested:  >8%    10/18/2016 10:10 AM 6.1 4.6 - 6.5 % Final    Comment:    Glycemic Control Guidelines for People with Diabetes:Non Diabetic:  <6%Goal of Therapy: <7%Additional Action Suggested:  >8%     CBG: No results for input(s): "GLUCAP" in the last 168 hours.  CRITICAL CARE Performed by: Lanier Clam   Total critical care time: 35 minutes  Critical care time was exclusive of separately billable procedures and treating other patients.  Critical care was necessary to treat or prevent imminent or life-threatening deterioration.  Critical care was time spent personally by me on the following activities: development of treatment plan with patient and/or surrogate as well as nursing, discussions with consultants, evaluation of patient's response to treatment, examination of patient, obtaining history from patient or surrogate, ordering and performing treatments and interventions, ordering and review of laboratory studies, ordering and review of radiographic studies, pulse oximetry and re-evaluation of patient's condition.  Tessie Fass MSN, AGACNP-BC [facility] Pulmonary/Critical Care Medicine Amion for pager  08/01/2022, 8:44 AM

## 2022-08-01 NOTE — Progress Notes (Signed)
Asked Pt about cpap states, he has not used one in years, and does not care to [name] one.

## 2022-08-02 ENCOUNTER — Inpatient Hospital Stay (HOSPITAL_COMMUNITY): Payer: 59

## 2022-08-02 DIAGNOSIS — E876 Hypokalemia: Secondary | ICD-10-CM

## 2022-08-02 DIAGNOSIS — N179 Acute kidney failure, unspecified: Secondary | ICD-10-CM

## 2022-08-02 DIAGNOSIS — R7989 Other specified abnormal findings of blood chemistry: Secondary | ICD-10-CM

## 2022-08-02 DIAGNOSIS — I161 Hypertensive emergency: Principal | ICD-10-CM

## 2022-08-02 LAB — RAPID URINE DRUG SCREEN, HOSP PERFORMED
Amphetamines: NOT DETECTED
Barbiturates: NOT DETECTED
Benzodiazepines: NOT DETECTED
Cocaine: NOT DETECTED
Opiates: NOT DETECTED
Tetrahydrocannabinol: NOT DETECTED

## 2022-08-02 LAB — URINALYSIS, ROUTINE W REFLEX MICROSCOPIC
Bilirubin Urine: NEGATIVE
Glucose, UA: NEGATIVE mg/dL
Hgb urine dipstick: NEGATIVE
Ketones, ur: NEGATIVE mg/dL
Leukocytes,Ua: NEGATIVE
Nitrite: NEGATIVE
Protein, ur: 100 mg/dL — AB
Specific Gravity, Urine: 1.017 (ref 1.005–1.030)
pH: 5 (ref 5.0–8.0)

## 2022-08-02 LAB — CREATININE, URINE, RANDOM: Creatinine, Urine: 209 mg/dL

## 2022-08-02 LAB — CBC
HCT: 46.7 % (ref 39.0–52.0)
Hemoglobin: 14.6 g/dL (ref 13.0–17.0)
MCH: 24.8 pg — ABNORMAL LOW (ref 26.0–34.0)
MCHC: 31.3 g/dL (ref 30.0–36.0)
MCV: 79.3 fL — ABNORMAL LOW (ref 80.0–100.0)
Platelets: 214 10*3/uL (ref 150–400)
RBC: 5.89 MIL/uL — ABNORMAL HIGH (ref 4.22–5.81)
RDW: 15.8 % — ABNORMAL HIGH (ref 11.5–15.5)
WBC: 9 10*3/uL (ref 4.0–10.5)
nRBC: 0 % (ref 0.0–0.2)

## 2022-08-02 LAB — BASIC METABOLIC PANEL
Anion gap: 9 (ref 5–15)
BUN: 29 mg/dL — ABNORMAL HIGH (ref 6–20)
CO2: 24 mmol/L (ref 22–32)
Calcium: 9.5 mg/dL (ref 8.9–10.3)
Chloride: 105 mmol/L (ref 98–111)
Creatinine, Ser: 2.83 mg/dL — ABNORMAL HIGH (ref 0.61–1.24)
GFR, Estimated: 26 mL/min — ABNORMAL LOW (ref >60)
Glucose, Bld: 136 mg/dL — ABNORMAL HIGH (ref 70–99)
Potassium: 3.2 mmol/L — ABNORMAL LOW (ref 3.5–5.1)
Sodium: 138 mmol/L (ref 135–145)

## 2022-08-02 LAB — SODIUM, URINE, RANDOM: Sodium, Ur: 68 mmol/L

## 2022-08-02 MED ORDER — CHLORHEXIDINE GLUCONATE CLOTH 2 % EX PADS: 6.0000 | MEDICATED_PAD | Freq: Every day | CUTANEOUS | Status: DC

## 2022-08-02 MED ORDER — CARVEDILOL 12.5 MG PO TABS
25.0000 mg | ORAL_TABLET | Freq: Two times a day (BID) | ORAL | Status: DC
  Administered 2022-08-02 – 2022-08-03 (×2): 25 mg via ORAL
  Filled 2022-08-02 (×2): qty 2

## 2022-08-02 MED ORDER — ACETAMINOPHEN 325 MG PO TABS
650.0000 mg | ORAL_TABLET | Freq: Four times a day (QID) | ORAL | Status: DC | PRN
  Administered 2022-08-02 (×2): 650 mg via ORAL
  Filled 2022-08-02 (×2): qty 2

## 2022-08-02 MED ORDER — HYDRALAZINE HCL 50 MG PO TABS
50.0000 mg | ORAL_TABLET | Freq: Three times a day (TID) | ORAL | Status: DC
  Administered 2022-08-03: 50 mg via ORAL
  Filled 2022-08-02 (×2): qty 1

## 2022-08-02 MED ORDER — FENTANYL 2500MCG IN NS 250ML (10MCG/ML) PREMIX INFUSION: 0.0000 ug/h | INTRAVENOUS | Status: DC

## 2022-08-02 MED ORDER — POTASSIUM CHLORIDE 20 MEQ PO PACK
40.0000 meq | PACK | Freq: Once | ORAL | Status: AC
  Administered 2022-08-02: 40 meq via ORAL
  Filled 2022-08-02: qty 2

## 2022-08-02 MED ORDER — HYDRALAZINE HCL 20 MG/ML IJ SOLN: 10.0000 mg | Freq: Four times a day (QID) | INTRAMUSCULAR | Status: DC | PRN

## 2022-08-02 MED ORDER — PROPOFOL 1000 MG/100ML IV EMUL: 5.0000 ug/kg/min | INTRAVENOUS | Status: DC

## 2022-08-02 MED ORDER — CARVEDILOL 12.5 MG PO TABS
12.5000 mg | ORAL_TABLET | Freq: Once | ORAL | Status: AC
  Administered 2022-08-02: 12.5 mg via ORAL
  Filled 2022-08-02: qty 1

## 2022-08-02 MED ORDER — HYDRALAZINE HCL 25 MG PO TABS
25.0000 mg | ORAL_TABLET | Freq: Two times a day (BID) | ORAL | Status: DC
  Administered 2022-08-02: 25 mg via ORAL
  Filled 2022-08-02: qty 1

## 2022-08-02 MED ORDER — HYDRALAZINE HCL 25 MG PO TABS
25.0000 mg | ORAL_TABLET | Freq: Three times a day (TID) | ORAL | Status: DC
  Administered 2022-08-02: 25 mg via ORAL
  Filled 2022-08-02: qty 1

## 2022-08-02 NOTE — Progress Notes (Signed)
PROGRESS NOTE   Bruce Harris  ZOX:[id number] DOB: 05-24-1970 DOA: 07/31/2022 PCP: Sharlene Dory, DO   Date of Service: the patient was seen and examined on 08/02/2022  Brief Narrative:  52 year old male with past medical history of HTN, reported hx of cardiomyopathy, HLD who presented to ED 5/29 with complaints of headache and blurry vision.   Upon evaluation in the emergency department patient was found to be profoundly hypertensive with blood pressures in excess of 210/136 and evidence of acute kidney injury.  Due to concerns over hypertensive emergency PCCM was contacted and patient was admitted to the ICU on a Cleviprex infusion.    In the days that followed patient's blood pressures improved and patient was transitioned to an oral regimen of antihypertensive therapy.  Patient was then transferred to the hospitalist service on 5/31   Assessment and Plan: * Hypertensive emergency Improving blood pressures Associated headache and chest discomfort have resolved Adding hydralazine 25 mg 3 times daily and increasing Coreg to 25 mg twice daily Continue amlodipine, irbesartan, hydrochlorothiazide Concerning secondary workup, renal artery ultrasound performed on 5/30 unremarkable.  Aldosterone and renin levels are still pending although primary aldosteronism is extremely unlikely.  AKI (acute kidney injury) (HCC) Creatinine still rising Suspect that this is secondary to the hypertensive emergency with renal function testing lagging behind clinical picture Performing workup of renal injury with urine electrolytes, renal ultrasound Strict input and output monitoring Titration of antihypertensives Serial chemistries  Hypokalemia Replacing with potassium chloride Evaluating for concurrent hypomagnesemia  Monitoring potassium levels with serial chemistries.   Elevated troponin level not due myocardial infarction Chest discomfort on admission resolved with improving blood  pressures Elevated troponin on admission however serial troponins reveal flat trajectory of elevation Elevated troponin likely secondary to elevated blood pressure/hypertensive emergency and not due to true plaque rupture    Subjective:  Patient denies chest pain headache or blurry vision at this time.  Patient states that he is feeling much better than initial hospitalization.  Patient is still complaining of some generalized weakness.  Physical Exam:  Vitals:   08/02/22 1700 08/02/22 1928 08/02/22 2123 08/02/22 2340  BP:      Pulse:      Resp:      Temp: 98.2 F (36.8 C) 98.4 F (36.9 C) (!) 97.3 F (36.3 C) 97.7 F (36.5 C)  TempSrc: Oral Oral Oral Oral  SpO2:      Weight:      Height:        Constitutional: Awake alert and oriented x3, no associated distress.   Skin: no rashes, no lesions, good skin turgor noted. Eyes: Pupils are equally reactive to light.  No evidence of scleral icterus or conjunctival pallor.  ENMT: Moist mucous membranes noted.  Posterior pharynx clear of any exudate or lesions.   Respiratory: clear to auscultation bilaterally, no wheezing, no crackles. Normal respiratory effort. No accessory muscle use.  Cardiovascular: Regular rate and rhythm, no murmurs / rubs / gallops. No extremity edema. 2+ pedal pulses. No carotid bruits.  Abdomen: Abdomen is soft and nontender.  No evidence of intra-abdominal masses.  Positive bowel sounds noted in all quadrants.   Musculoskeletal: No joint deformity upper and lower extremities. Good ROM, no contractures. Normal muscle tone.    Data Reviewed:  I have personally reviewed and interpreted labs, imaging.  Significant findings are   CBC: Recent Labs  Lab 07/31/22 1432 08/01/22 0307 08/02/22 1529  WBC 6.1 10.6* 9.0  HGB 13.6 13.9 14.6  HCT 44.2 43.8 46.7  MCV 80.8 79.8* 79.3*  PLT 273 301 214   Basic Metabolic Panel: Recent Labs  Lab 07/31/22 1432 07/31/22 1848 08/01/22 0307 08/02/22 0307  NA  139  --  139 138  K 3.1*  --  3.4* 3.2*  CL 106  --  106 105  CO2 22  --  23 24  GLUCOSE 92  --  124* 136*  BUN 21*  --  24* 29*  CREATININE 2.46* 2.28* 2.68* 2.83*  CALCIUM 8.7*  --  9.2 9.5    Telemetry: Personally reviewed.  Rhythm is normal sinus rhythm with heart rate of 80BPM.  No dynamic ST segment changes appreciated.   Code Status:  Full code.  Code status decision has been confirmed with: patient    Severity of Illness:  The appropriate patient status for this patient is INPATIENT. Inpatient status is judged to be reasonable and necessary in order to provide the required intensity of service to ensure the patient's safety. The patient's presenting symptoms, physical exam findings, and initial radiographic and laboratory data in the context of their chronic comorbidities is felt to place them at high risk for further clinical deterioration. Furthermore, it is not anticipated that the patient will be medically stable for discharge from the hospital within 2 midnights of admission.   * I certify that at the point of admission it is my clinical judgment that the patient will require inpatient hospital care spanning beyond 2 midnights from the point of admission due to high intensity of service, high risk for further deterioration and high frequency of surveillance required.*  Time spent:  51 minutes  Author:  Marinda Elk MD  08/02/2022 11:56 PM

## 2022-08-02 NOTE — Assessment & Plan Note (Signed)
Chest discomfort on admission resolved with improving blood pressures Elevated troponin on admission however serial troponins reveal flat trajectory of elevation Elevated troponin likely secondary to elevated blood pressure/hypertensive emergency and not due to true plaque rupture

## 2022-08-02 NOTE — Assessment & Plan Note (Signed)
Creatinine still rising Suspect that this is secondary to the hypertensive emergency with renal function testing lagging behind clinical picture Performing workup of renal injury with urine electrolytes, renal ultrasound Strict input and output monitoring Titration of antihypertensives Serial chemistries

## 2022-08-02 NOTE — Assessment & Plan Note (Signed)
Improving blood pressures Associated headache and chest discomfort have resolved Adding hydralazine 25 mg 3 times daily and increasing Coreg to 25 mg twice daily Continue amlodipine, irbesartan, hydrochlorothiazide Concerning secondary workup, renal artery ultrasound performed on 5/30 unremarkable.  Aldosterone and renin levels are still pending although primary aldosteronism is extremely unlikely.

## 2022-08-02 NOTE — Progress Notes (Signed)
eLink Physician-Brief Progress Note Patient Name: Bruce Harris DOB: March 06, 1970 MRN: [id number]   Date of Service  08/02/2022  HPI/Events of Note  52 year old male admitted for hypertensive crisis.  Complaining of headache.  Bedside nurse requiring Tylenol.  eICU Interventions  Hypertensive crisis appears to have resolved.  He is off clevidipine.  Order Tylenol as needed for headache.     Intervention Category Intermediate Interventions: Pain - evaluation and management  Carilyn Goodpasture 08/02/2022, 1:57 AM

## 2022-08-02 NOTE — TOC Initial Note (Signed)
Transition of Care [facility]) - Initial/Assessment Note    Patient Details  Name: Bruce Harris MRN: [id number] Date of Birth: 07-Feb-1971  Transition of Care [facility]) CM/SW Contact:    Lavenia Atlas, RN Phone Number: 08/02/2022, 6:21 PM  Clinical Narrative:       Per chart review patient currently in [facility] ICU for hypertensive urgency. This RNCM spoke with patient at bedside who reports PTA he uses a bp monitor however may need a new one. Patient also reports he plans to get a thermometer as well. Patient reports he has not used a CPAP in over 5 years. Patient reports his last sleep study was 5 years ago. Patient verbalized if he was recommended for CPAP he would use. Patient reports no difficulty obtaining his medications and is aware his medications may change. Patient reports using [facility] on [address].  Transportation at discharge: family  TOC will continue to follow for needs.              Expected Discharge Plan: Home/Self Care Barriers to Discharge: Continued Medical Work up   Patient Goals and CMS Choice Patient states their goals for this hospitalization and ongoing recovery are:: return home feeling better CMS Medicare.gov Compare Post Acute Care list provided to:: Patient Choice offered to / list presented to : Patient [place] ownership interest in [facility].provided to:: Patient    Expected Discharge Plan and Services In-house Referral: NA Discharge Planning Services: CM Consult Post Acute Care Choice: NA Living arrangements for the past 2 months: Single Family Home                 DME Arranged: N/A DME Agency: NA       HH Arranged: NA HH Agency: NA        Prior Living Arrangements/Services Living arrangements for the past 2 months: Single Family Home Lives with:: Self Patient language and need for interpreter reviewed:: Yes Do you feel safe going back to the place where you live?: Yes      Need for Family Participation in Patient Care: No  (Comment) Care giver support system in place?: Yes (comment) Current home services: DME (bp monitor) Criminal Activity/Legal Involvement Pertinent to Current Situation/Hospitalization: No - Comment as needed  Activities of Daily Living Home Assistive Devices/Equipment: None ADL Screening (condition at time of admission) Patient's cognitive ability adequate to safely complete daily activities?: Yes Is the patient deaf or have difficulty hearing?: No Does the patient have difficulty seeing, even when wearing glasses/contacts?: No Does the patient have difficulty concentrating, remembering, or making decisions?: No Patient able to express need for assistance with ADLs?: Yes Does the patient have difficulty dressing or bathing?: No Independently performs ADLs?: Yes (appropriate for developmental age) Does the patient have difficulty walking or climbing stairs?: No Weakness of Legs: None Weakness of Arms/Hands: None  Permission Sought/Granted Permission sought to share information with : Case Manager Permission granted to share information with : Yes, Verbal Permission Granted  Share Information with NAME: Care Manager           Emotional Assessment Appearance:: Appears stated age Attitude/Demeanor/Rapport: Engaged Affect (typically observed): Accepting Orientation: : Oriented to Self, Oriented to Place, Oriented to  Time, Oriented to Situation Alcohol / Substance Use: Not Applicable Psych Involvement: No (comment)  Admission diagnosis:  Hypertensive urgency [I16.0] Hypertensive emergency [I16.1] Patient Active Problem List   Diagnosis Date Noted   Hypertensive emergency 08/01/2022   AKI (acute kidney injury) (HCC) 08/01/2022  Hypokalemia 08/01/2022   Hypertensive urgency 07/31/2022   Adhesive capsulitis of left shoulder 07/21/2019   Seasonal allergies 06/17/2018   Premature ejaculation 05/13/2018   OSA (obstructive sleep apnea) 05/19/2017   Witnessed apneic spells  03/26/2017   Essential hypertension 02/02/2016   Hypogonadism in male 02/02/2016   Hyperlipidemia 07/20/2014   Visit for preventive health examination 10/13/2013   Prostate cancer screening 10/13/2013   Bee sting allergies 09/23/2010   LIPOMA 12/31/2007   ERECTILE DYSFUNCTION 04/15/2007   GOUT 12/20/2006   PCP:  Sharlene Dory, DO Pharmacy:   [facility] [phone] [address] [address], [place] - [address] AT [facility] [address] [place] [address] Phone: [phone] Fax: [phone]     Social Determinants of Health (SDOH) Social History: SDOH Screenings   Food Insecurity: No Food Insecurity (07/31/2022)  Housing: Low Risk  (07/31/2022)  Transportation Needs: No Transportation Needs (07/31/2022)  Utilities: Not At Risk (07/31/2022)  Depression (PHQ2-9): Low Risk  (02/19/2022)  Tobacco Use: Low Risk  (07/31/2022)   SDOH Interventions:     Readmission Risk Interventions    08/02/2022    6:19 PM  Readmission Risk Prevention Plan  Post Dischage Appt Complete  Medication Screening Complete  Transportation Screening Complete

## 2022-08-02 NOTE — Assessment & Plan Note (Signed)
·   Replacing with potassium chloride °· Evaluating for concurrent hypomagnesemia  °· Monitoring potassium levels with serial chemistries. ° °

## 2022-08-02 NOTE — Hospital Course (Signed)
52 year old male with past medical history of HTN, reported hx of cardiomyopathy, HLD who presented to ED 5/29 with complaints of headache and blurry vision.   Upon evaluation in the emergency department patient was found to be profoundly hypertensive with blood pressures in excess of 210/136 and evidence of acute kidney injury.  Due to concerns over hypertensive emergency PCCM was contacted and patient was admitted to the ICU on a Cleviprex infusion.    In the days that followed patient's blood pressures improved and patient was transitioned to an oral regimen of antihypertensive therapy.  Patient was then transferred to the hospitalist service on 5/31.  Workup of the patient's markedly elevated blood pressures included renal ultrasound which was negative for renal artery stenosis.  Blood testing was also obtained for aldosterone and renin activity with ratio however this was still pending at time of discharge and can be followed up as an outpatient.  Patient continued to exhibit extremely high blood pressures and therefore a regimen of multiple antihypertensives were initiated and titrated throughout the remainder of the hospitalization.  To achieve improved blood pressure control the patient was managed with amlodipine 10 mg daily, hydrochlorothiazide 25 mg daily, Coreg 25 mg twice daily, irbesartan 300 mg daily and hydralazine 50 mg 3 times daily.  Patient had a notable deterioration in renal function compared to prior labs in the past being about initial concerns for acute kidney injury.  Creatinine trended upward slightly throughout the hospitalization but seem to stabilize with a creatinine of approximately 2.8-2.9.  I believe that a lot of this worsening renal function is due to extremely poorly controlled blood pressures.  I am hopeful that he will experience some improvement in renal function in the coming days to weeks as blood pressures remain controlled with medication compliance.  Renal  function should be rechecked by his outpatient provider upon follow-up.  As the patient's blood pressures slowly improved patient's symptoms of headaches, blurry vision and chest discomfort all resolved.  Patient was discharged home in improved and stable condition on 6/1 but was advised to follow-up with his primary care provider closely, check his blood pressures regularly, maintain a low-sodium diet and request to be retested for obstructive sleep apnea.

## 2022-08-02 NOTE — Progress Notes (Signed)
eLink Physician-Brief Progress Note Patient Name: Bruce Harris DOB: [birth year] MRN: [id number]   Date of Service  08/02/2022  HPI/Events of Note  Notified about abnormal a.m. labs.  K is 3.2.  eICU Interventions  Replete orally.     Intervention Category Minor Interventions: Electrolytes abnormality - evaluation and management  Carilyn Goodpasture 08/02/2022, 5:08 AM

## 2022-08-03 ENCOUNTER — Other Ambulatory Visit (HOSPITAL_COMMUNITY): Payer: Self-pay

## 2022-08-03 DIAGNOSIS — R7989 Other specified abnormal findings of blood chemistry: Secondary | ICD-10-CM | POA: Diagnosis not present

## 2022-08-03 DIAGNOSIS — I161 Hypertensive emergency: Secondary | ICD-10-CM | POA: Diagnosis not present

## 2022-08-03 DIAGNOSIS — E876 Hypokalemia: Secondary | ICD-10-CM | POA: Diagnosis not present

## 2022-08-03 DIAGNOSIS — N179 Acute kidney failure, unspecified: Secondary | ICD-10-CM | POA: Diagnosis not present

## 2022-08-03 DIAGNOSIS — I160 Hypertensive urgency: Secondary | ICD-10-CM

## 2022-08-03 LAB — CBC WITH DIFFERENTIAL/PLATELET
Abs Immature Granulocytes: 0.02 10*3/uL (ref 0.00–0.07)
Basophils Absolute: 0 10*3/uL (ref 0.0–0.1)
Basophils Relative: 0 %
Eosinophils Absolute: 0.1 10*3/uL (ref 0.0–0.5)
Eosinophils Relative: 2 %
HCT: 45.8 % (ref 39.0–52.0)
Hemoglobin: 14.1 g/dL (ref 13.0–17.0)
Immature Granulocytes: 0 %
Lymphocytes Relative: 36 %
Lymphs Abs: 2.9 10*3/uL (ref 0.7–4.0)
MCH: 24.9 pg — ABNORMAL LOW (ref 26.0–34.0)
MCHC: 30.8 g/dL (ref 30.0–36.0)
MCV: 80.8 fL (ref 80.0–100.0)
Monocytes Absolute: 0.6 10*3/uL (ref 0.1–1.0)
Monocytes Relative: 7 %
Neutro Abs: 4.4 10*3/uL (ref 1.7–7.7)
Neutrophils Relative %: 55 %
Platelets: 283 10*3/uL (ref 150–400)
RBC: 5.67 MIL/uL (ref 4.22–5.81)
RDW: 15.7 % — ABNORMAL HIGH (ref 11.5–15.5)
WBC: 8.1 10*3/uL (ref 4.0–10.5)
nRBC: 0 % (ref 0.0–0.2)

## 2022-08-03 LAB — COMPREHENSIVE METABOLIC PANEL
ALT: 22 U/L (ref 0–44)
AST: 29 U/L (ref 15–41)
Albumin: 3.6 g/dL (ref 3.5–5.0)
Alkaline Phosphatase: 55 U/L (ref 38–126)
Anion gap: 9 (ref 5–15)
BUN: 33 mg/dL — ABNORMAL HIGH (ref 6–20)
CO2: 24 mmol/L (ref 22–32)
Calcium: 9.4 mg/dL (ref 8.9–10.3)
Chloride: 106 mmol/L (ref 98–111)
Creatinine, Ser: 2.93 mg/dL — ABNORMAL HIGH (ref 0.61–1.24)
GFR, Estimated: 25 mL/min — ABNORMAL LOW (ref >60)
Glucose, Bld: 109 mg/dL — ABNORMAL HIGH (ref 70–99)
Potassium: 3.2 mmol/L — ABNORMAL LOW (ref 3.5–5.1)
Sodium: 139 mmol/L (ref 135–145)
Total Bilirubin: 0.4 mg/dL (ref 0.3–1.2)
Total Protein: 7.5 g/dL (ref 6.5–8.1)

## 2022-08-03 LAB — TRIGLYCERIDES: Triglycerides: 195 mg/dL — ABNORMAL HIGH (ref <150)

## 2022-08-03 LAB — UREA NITROGEN, URINE: Urea Nitrogen, Ur: 911 mg/dL

## 2022-08-03 LAB — MAGNESIUM: Magnesium: 2.1 mg/dL (ref 1.7–2.4)

## 2022-08-03 MED ORDER — CARVEDILOL 25 MG PO TABS
25.0000 mg | ORAL_TABLET | Freq: Two times a day (BID) | ORAL | 2 refills | Status: DC
  Filled 2022-08-03: qty 60, 30d supply, fill #0
  Filled 2022-09-13: qty 60, 30d supply, fill #1
  Filled 2023-03-18 – 2023-07-31 (×2): qty 60, 30d supply, fill #2

## 2022-08-03 MED ORDER — HYDRALAZINE HCL 50 MG PO TABS
50.0000 mg | ORAL_TABLET | Freq: Three times a day (TID) | ORAL | 2 refills | Status: DC
  Filled 2022-08-03: qty 90, 30d supply, fill #0
  Filled 2022-09-13: qty 90, 30d supply, fill #1
  Filled 2023-03-18 – 2023-07-31 (×2): qty 90, 30d supply, fill #2

## 2022-08-03 MED ORDER — HYDROCHLOROTHIAZIDE 25 MG PO TABS
25.0000 mg | ORAL_TABLET | Freq: Every day | ORAL | 2 refills | Status: DC
Start: 2022-08-03 — End: 2023-12-16
  Filled 2022-08-03: qty 30, 30d supply, fill #0
  Filled 2022-09-13: qty 30, 30d supply, fill #1
  Filled 2023-03-18 – 2023-07-31 (×2): qty 30, 30d supply, fill #2

## 2022-08-03 MED ORDER — POTASSIUM CHLORIDE 10 MEQ/100ML IV SOLN
10.0000 meq | INTRAVENOUS | Status: AC
  Administered 2022-08-03 (×3): 10 meq via INTRAVENOUS
  Filled 2022-08-03 (×3): qty 100

## 2022-08-03 MED ORDER — POTASSIUM CHLORIDE CRYS ER 20 MEQ PO TBCR
20.0000 meq | EXTENDED_RELEASE_TABLET | ORAL | Status: AC
  Administered 2022-08-03 (×2): 20 meq via ORAL
  Filled 2022-08-03 (×2): qty 1

## 2022-08-03 MED ORDER — AMLODIPINE BESYLATE-VALSARTAN 10-320 MG PO TABS
1.0000 | ORAL_TABLET | Freq: Every day | ORAL | 2 refills | Status: DC
  Filled 2022-08-03: qty 30, 30d supply, fill #0

## 2022-08-03 MED ORDER — POTASSIUM CHLORIDE CRYS ER 20 MEQ PO TBCR
20.0000 meq | EXTENDED_RELEASE_TABLET | Freq: Every day | ORAL | 2 refills | Status: DC
  Filled 2022-08-03: qty 30, 30d supply, fill #0

## 2022-08-03 NOTE — Discharge Instructions (Addendum)
Please take all prescribed medications exactly as instructed including your new prescriptions for hydralazine and carvedilol for your blood pressure.   You have additionally been initiated on daily potassium supplements. Please consume a low sodium diet as this can help to keep your blood pressure controlled. Please speak to your primary care provider about getting a referral for a repeat assessment for obstructive sleep apnea with a polysomnogram.  Uncontrolled sleep apnea can worsen blood pressure as well. Please avoid heavy alcohol use as this can additionally contribute to elevated blood pressures. Please increase your physical activity as tolerated. Please maintain all outpatient follow-up appointments including follow-up with your primary care provider. You still have blood work pending for workup of your elevated blood pressures during this hospitalization.  Please speak to your primary care provider about following up on these labs. Please return to the emergency department if you develop worsening shortness of breath, chest pain, headaches, changes in vision, weakness or inability to tolerate oral intake. Please check your blood pressure at home daily at the same time and maintain a blood pressure journal which can be reviewed with your primary care provider upon follow-up.

## 2022-08-03 NOTE — Progress Notes (Signed)
Pharmacy Electrolyte Replacement   Recent Labs:  Recent Labs    08/03/22 0303  K 3.2*  MG 2.1  CREATININE 2.93*    Low Critical Values (K </= 2.5, Phos </= 1, Mg </= 1) Present: None  MD Contacted: N/A Diet = regular IV= peripheral  Plan:  KCl 20 mEq PO q2h x 2 KCl 10 mEq q1h x 4 Recheck BMET at 2000   Thank you for allowing pharmacy to be a part of this patient's care.  Selinda Eon, PharmD, BCPS Clinical Pharmacist [place] Please utilize Amion for appropriate phone number to reach the unit pharmacist [facility]) 08/03/2022 7:42 AM

## 2022-08-03 NOTE — Plan of Care (Signed)
Patient discharged to home understands instructions, TOC pharmacy delivered Medications.

## 2022-08-03 NOTE — Discharge Summary (Addendum)
Physician Discharge Summary   Patient: Bruce Harris MRN: [id number] DOB: 30-Sep-1970  Admit date:     07/31/2022  Discharge date: 08/03/22  Discharge Physician: Marinda Elk   PCP: Sharlene Dory, DO   Recommendations at discharge:   Please take all prescribed medications exactly as instructed including your new prescriptions for hydralazine and carvedilol for your blood pressure.   You have additionally been initiated on daily potassium supplements. Please consume a low sodium diet as this can help to keep your blood pressure controlled. Please check your blood pressure at home daily at the same time and maintain a blood pressure journal which can be reviewed with your primary care provider upon follow-up. Please speak to your primary care provider about getting a referral for a repeat assessment for obstructive sleep apnea with a polysomnogram.  Uncontrolled sleep apnea can worsen blood pressure as well. Please avoid heavy alcohol use as this can additionally contribute to elevated blood pressures. Please increase your physical activity as tolerated. Please maintain all outpatient follow-up appointments including follow-up with your primary care provider. You still have blood work pending for workup of your elevated blood pressures during this hospitalization.  Please speak to your primary care provider about following up on these labs. Please return to the emergency department if you develop worsening shortness of breath, chest pain, headaches, changes in vision, weakness or inability to tolerate oral intake.  Discharge Diagnoses: Principal Problem:   Hypertensive emergency Active Problems:   AKI (acute kidney injury) (HCC)   Hypokalemia   Elevated troponin level not due myocardial infarction   Hypertensive urgency  Resolved Problems:   * No resolved hospital problems. *   Hospital Course: 52 year old male with past medical history of HTN, reported hx of  cardiomyopathy, HLD who presented to ED 5/29 with complaints of headache and blurry vision.   Upon evaluation in the emergency department patient was found to be profoundly hypertensive with blood pressures in excess of 210/136 and evidence of acute kidney injury.  Due to concerns over hypertensive emergency PCCM was contacted and patient was admitted to the ICU on a Cleviprex infusion.    In the days that followed patient's blood pressures improved and patient was transitioned to an oral regimen of antihypertensive therapy.  Patient was then transferred to the hospitalist service on 5/31.  Workup of the patient's markedly elevated blood pressures included renal ultrasound which was negative for renal artery stenosis.  Blood testing was also obtained for aldosterone and renin activity with ratio however this was still pending at time of discharge and can be followed up as an outpatient.  Patient continued to exhibit extremely high blood pressures and therefore a regimen of multiple antihypertensives were initiated and titrated throughout the remainder of the hospitalization.  To achieve improved blood pressure control the patient was managed with amlodipine 10 mg daily, hydrochlorothiazide 25 mg daily, Coreg 25 mg twice daily, irbesartan 300 mg daily and hydralazine 50 mg 3 times daily.  Patient had a notable deterioration in renal function compared to prior labs in the past being about initial concerns for acute kidney injury.  Creatinine trended upward slightly throughout the hospitalization but seem to stabilize with a creatinine of approximately 2.8-2.9.  I believe that a lot of this worsening renal function is due to extremely poorly controlled blood pressures.  I am hopeful that he will experience some improvement in renal function in the coming days to weeks as blood pressures remain controlled with medication compliance.  Renal function should be rechecked by his outpatient provider upon  follow-up.  As the patient's blood pressures slowly improved patient's symptoms of headaches, blurry vision and chest discomfort all resolved.  Patient was discharged home in improved and stable condition on 6/1 but was advised to follow-up with his primary care provider closely, check his blood pressures regularly, maintain a low-sodium diet and request to be retested for obstructive sleep apnea.   Consultants: None Procedures performed: None  Disposition: Home Diet recommendation:  Discharge Diet Orders (From admission, onward)     Start     Ordered   08/03/22 0000  Diet - low sodium heart healthy        08/03/22 1309           Cardiac diet  DISCHARGE MEDICATION: Allergies as of 08/03/2022       Reactions   Bee Venom Swelling        Medication List     STOP taking these medications    fluticasone 50 MCG/ACT nasal spray Commonly known as: FLONASE   levocetirizine 5 MG tablet Commonly known as: XYZAL   meloxicam 15 MG tablet Commonly known as: MOBIC   tiZANidine 4 MG tablet Commonly known as: ZANAFLEX       TAKE these medications    amLODipine-valsartan 10-320 MG tablet Commonly known as: EXFORGE Take 1 tablet by mouth daily.   azelastine 0.1 % nasal spray Commonly known as: ASTELIN Place 2 sprays into both nostrils 2 (two) times daily.   carvedilol 25 MG tablet Commonly known as: COREG Take 1 tablet (25 mg total) by mouth 2 (two) times daily with a meal.   EPINEPHrine 0.3 mg/0.3 mL Soaj injection Commonly known as: EpiPen 2-Pak Inject 0.3 mLs (0.3 mg total) into the muscle as needed for anaphylaxis.   hydrALAZINE 50 MG tablet Commonly known as: APRESOLINE Take 1 tablet (50 mg total) by mouth 3 (three) times daily.   hydrochlorothiazide 25 MG tablet Commonly known as: HYDRODIURIL Take 1 tablet (25 mg total) by mouth daily.   potassium chloride SA 20 MEQ tablet Commonly known as: KLOR-CON M Take 1 tablet (20 mEq total) by mouth daily.    rosuvastatin 20 MG tablet Commonly known as: CRESTOR Take 1 tablet (20 mg total) by mouth daily.        Follow-up Information     Sharlene Dory, DO. Schedule an appointment as soon as possible for a visit in 1 week(s).   Specialty: Family Medicine Contact information: [address] [place] [address] [phone]                 Discharge Exam: Ceasar Mons Weights   08/01/22 0500 08/02/22 0500 08/03/22 0500  Weight: 103.4 kg 102.3 kg 101.4 kg    Constitutional: Awake alert and oriented x3, no associated distress.   Respiratory: clear to auscultation bilaterally, no wheezing, no crackles. Normal respiratory effort. No accessory muscle use.  Cardiovascular: Regular rate and rhythm, no murmurs / rubs / gallops. No extremity edema. 2+ pedal pulses. No carotid bruits.  Abdomen: Abdomen is soft and nontender.  No evidence of intra-abdominal masses.  Positive bowel sounds noted in all quadrants.   Musculoskeletal: No joint deformity upper and lower extremities. Good ROM, no contractures. Normal muscle tone.     Condition at discharge: fair  The results of significant diagnostics from this hospitalization (including imaging, microbiology, ancillary and laboratory) are listed below for reference.   Imaging Studies: US RENAL  Result Date: 08/02/2022 CLINICAL DATA:  [id number] Renal failure [id number] EXAM: RENAL / URINARY TRACT ULTRASOUND COMPLETE COMPARISON:  CT 02/18/2019 FINDINGS: Right Kidney: Renal measurements: 10.1 x 5.1 x 6.3 cm = volume: 168.4 mL. No hydronephrosis. Increased renal cortical echogenicity. Left Kidney: Renal measurements: 10.1 x 5.1 x 4.8 cm = volume: 130.4 mL. No hydronephrosis. Increased renal cortical echogenicity. Bladder: Appears normal for degree of bladder distention. Other: None. IMPRESSION: No hydronephrosis. Increased renal cortical echogenicity bilaterally, as can be seen in medical renal disease. Electronically Signed   By: Caprice Renshaw M.D.   On: 08/02/2022 09:26   VAS US RENAL ARTERY DUPLEX  Result Date: 08/01/2022 ABDOMINAL VISCERAL Patient Name:  BELAL ALVARDO  Date of Exam:   08/01/2022 Medical Rec #: [id number]          Accession #:    [id number] Date of Birth: 02-09-1971          Patient Gender: M Patient Age:   26 years Exam Location:  [facility] Procedure:      VAS US RENAL ARTERY DUPLEX Referring Phys: [id number] GRACE E BOWSER -------------------------------------------------------------------------------- Indications: Hypertension Limitations: Air/bowel gas and obesity. Comparison Study: No prior studies. Performing Technologist: Jean Rosenthal RDMS, RVT  Examination Guidelines: A complete evaluation includes B-mode imaging, spectral Doppler, color Doppler, and power Doppler as needed of all accessible portions of each vessel. Bilateral testing is considered an integral part of a complete examination. Limited examinations for reoccurring indications may be performed as noted.  Duplex Findings: +----------------------+--------+--------+------+--------+ Mesenteric            PSV cm/sEDV cm/sPlaqueComments +----------------------+--------+--------+------+--------+ Aorta Mid               111                          +----------------------+--------+--------+------+--------+ Celiac Artery Proximal  198                          +----------------------+--------+--------+------+--------+ SMA Proximal            237                          +----------------------+--------+--------+------+--------+    +------------------+--------+--------+-------+ Right Renal ArteryPSV cm/sEDV cm/sComment +------------------+--------+--------+-------+ Origin              155      29           +------------------+--------+--------+-------+ Proximal             68      24           +------------------+--------+--------+-------+ Mid                  63      23            +------------------+--------+--------+-------+ Distal               47      14           +------------------+--------+--------+-------+ +-----------------+--------+--------+-------+ Left Renal ArteryPSV cm/sEDV cm/sComment +-----------------+--------+--------+-------+ Origin              78      26           +-----------------+--------+--------+-------+ Proximal           111      36           +-----------------+--------+--------+-------+  Mid                141      30           +-----------------+--------+--------+-------+ Distal              65      18           +-----------------+--------+--------+-------+ +------------+--------+--------+----+-----------+--------+--------+----+ Right KidneyPSV cm/sEDV cm/sRI  Left KidneyPSV cm/sEDV cm/sRI   +------------+--------+--------+----+-----------+--------+--------+----+ Upper Pole  41      14      0.65Upper Pole 30      10      0.67 +------------+--------+--------+----+-----------+--------+--------+----+ Mid         39      15      0.        17      7       0.62 +------------+--------+--------+----+-----------+--------+--------+----+ Lower Pole  38      12      0.69Lower Pole 17      7       0.62 +------------+--------+--------+----+-----------+--------+--------+----+ Hilar       39      13      0.66Hilar      77      25      0.67 +------------+--------+--------+----+-----------+--------+--------+----+ +------------------+----+------------------+----+ Right Kidney          Left Kidney            +------------------+----+------------------+----+ RAR                   RAR                    +------------------+----+------------------+----+ RAR (manual)      1.4 RAR (manual)      1.3  +------------------+----+------------------+----+ Cortex                Cortex                 +------------------+----+------------------+----+ Cortex thickness      Corex thickness         +------------------+----+------------------+----+ Kidney length (cm)9.79Kidney length (cm)9.94 +------------------+----+------------------+----+  Summary: Renal:  Right: No evidence of right renal artery stenosis. Normal right        Resisitive Index. RRV flow present. Normal size right kidney. Left:  No evidence of left renal artery stenosis. Normal left        Resistive Index. LRV flow present. Normal size of left        kidney. Mesenteric: Normal Celiac artery and Superior Mesenteric artery findings.  *See table(s) above for measurements and observations.  Diagnosing physician: Heath Lark  Electronically signed by Heath Lark on 08/01/2022 at 5:39:42 PM.    Final    ECHOCARDIOGRAM COMPLETE  Result Date: 08/01/2022    ECHOCARDIOGRAM REPORT   Patient Name:   ERCELL BESHARA Date of Exam: 08/01/2022 Medical Rec #:  [id number]         Height:       69.0 in Accession #:    [id number]        Weight:       228.0 lb Date of Birth:  12-Jul-1970         BSA:          2.184 m Patient Age:    51 years          BP:           185/105 mmHg Patient Gender: M  HR:           86 bpm. Exam Location:  Inpatient Procedure: 2D Echo, Cardiac Doppler, Color Doppler and Strain Analysis Indications:    Cardiomyopathy  History:        Patient has no prior history of Echocardiogram examinations.                 Cardiomyopathy, Signs/Symptoms:Chest Pain; Risk                 Factors:Hypertension, Dyslipidemia and Sleep Apnea.  Sonographer:    Wallie Char Referring Phys: [phone] GRACE E BOWSER  Sonographer Comments: Global longitudinal strain was attempted. IMPRESSIONS  1. Left ventricular ejection fraction, by estimation, is 65 to 70%. The left ventricle has normal function. The left ventricle has no regional wall motion abnormalities. There is moderate hypertrophy of the basal septum. The other segments demonstrate mild left ventricular hypertrophy. Left ventricular diastolic parameters are consistent with Grade  I diastolic dysfunction (impaired relaxation).  2. Right ventricular systolic function is normal. The right ventricular size is normal. Tricuspid regurgitation signal is inadequate for assessing PA pressure.  3. Left atrial size was mild to moderately dilated.  4. The mitral valve is normal in structure. Trivial mitral valve regurgitation.  5. The aortic valve is tricuspid. Aortic valve regurgitation is not visualized. No aortic stenosis is present.  6. The inferior vena cava is normal in size with greater than 50% respiratory variability, suggesting right atrial pressure of 3 mmHg. FINDINGS  Left Ventricle: Left ventricular ejection fraction, by estimation, is 65 to 70%. The left ventricle has normal function. The left ventricle has no regional wall motion abnormalities. Global longitudinal strain performed but not reported based on interpreter judgement due to suboptimal tracking. The left ventricular internal cavity size was normal in size. There is moderate hypertrophy of the basal septum. The other segments demonstrate mild left ventricular hypertrophy. Left ventricular diastolic parameters are consistent with Grade I diastolic dysfunction (impaired relaxation). Right Ventricle: The right ventricular size is normal. No increase in right ventricular wall thickness. Right ventricular systolic function is normal. Tricuspid regurgitation signal is inadequate for assessing PA pressure. Left Atrium: Left atrial size was mild to moderately dilated. Right Atrium: Right atrial size was normal in size. Pericardium: There is no evidence of pericardial effusion. Mitral Valve: The mitral valve is normal in structure. Trivial mitral valve regurgitation. MV peak gradient, 5.1 mmHg. The mean mitral valve gradient is 3.0 mmHg. Tricuspid Valve: The tricuspid valve is normal in structure. Tricuspid valve regurgitation is trivial. Aortic Valve: The aortic valve is tricuspid. Aortic valve regurgitation is not visualized. No aortic  stenosis is present. Aortic valve mean gradient measures 7.5 mmHg. Aortic valve peak gradient measures 11.6 mmHg. Aortic valve area, by VTI measures 1.76  cm. Pulmonic Valve: The pulmonic valve was normal in structure. Pulmonic valve regurgitation is trivial. Aorta: The aortic root is normal in size and structure. Venous: The inferior vena cava is normal in size with greater than 50% respiratory variability, suggesting right atrial pressure of 3 mmHg. IAS/Shunts: The atrial septum is grossly normal.  LEFT VENTRICLE PLAX 2D LVIDd:         3.90 cm     Diastology LVIDs:         2.40 cm     LV e' medial:    9.28 cm/s LV PW:         1.20 cm     LV E/e' medial:  7.6 LV IVS:  1.00 cm     LV e' lateral:   4.10 cm/s LVOT diam:     1.70 cm     LV E/e' lateral: 17.1 LV SV:         55 LV SV Index:   25 LVOT Area:     2.27 cm  LV Volumes (MOD) LV vol d, MOD A2C: 70.2 ml LV vol d, MOD A4C: 93.3 ml LV vol s, MOD A2C: 23.2 ml LV vol s, MOD A4C: 31.2 ml LV SV MOD A2C:     47.0 ml LV SV MOD A4C:     93.3 ml LV SV MOD BP:      54.7 ml RIGHT VENTRICLE             IVC RV Basal diam:  2.90 cm     IVC diam: 1.40 cm RV S prime:     18.30 cm/s TAPSE (M-mode): 2.8 cm LEFT ATRIUM             Index        RIGHT ATRIUM           Index LA diam:        3.30 cm 1.51 cm/m   RA Area:     14.40 cm LA Vol (A2C):   36.7 ml 16.80 ml/m  RA Volume:   28.20 ml  12.91 ml/m LA Vol (A4C):   40.1 ml 18.36 ml/m LA Biplane Vol: 38.8 ml 17.76 ml/m  AORTIC VALVE AV Area (Vmax):    1.98 cm AV Area (Vmean):   1.78 cm AV Area (VTI):     1.76 cm AV Vmax:           170.00 cm/s AV Vmean:          130.500 cm/s AV VTI:            0.311 m AV Peak Grad:      11.6 mmHg AV Mean Grad:      7.5 mmHg LVOT Vmax:         148.00 cm/s LVOT Vmean:        102.550 cm/s LVOT VTI:          0.242 m LVOT/AV VTI ratio: 0.78  AORTA Ao Root diam: 3.40 cm Ao Asc diam:  3.30 cm MITRAL VALVE MV Area (PHT): 3.21 cm     SHUNTS MV Area VTI:   2.11 cm     Systemic VTI:  0.24 m MV  Peak grad:  5.1 mmHg     Systemic Diam: 1.70 cm MV Mean grad:  3.0 mmHg MV Vmax:       1.13 m/s MV Vmean:      78.6 cm/s MV Decel Time: 236 msec MV E velocity: 70.10 cm/s MV A velocity: 102.00 cm/s MV E/A ratio:  0.69 Laurance Flatten MD Electronically signed by Laurance Flatten MD Signature Date/Time: 08/01/2022/11:42:03 AM    Final    CT Head Wo Contrast  Result Date: 07/31/2022 CLINICAL DATA:  Right arm numbness and tingling, headache and blurred vision EXAM: CT HEAD WITHOUT CONTRAST TECHNIQUE: Contiguous axial images were obtained from the base of the skull through the vertex without intravenous contrast. RADIATION DOSE REDUCTION: This exam was performed according to the departmental dose-optimization program which includes automated exposure control, adjustment of the mA and/or kV according to patient size and/or use of iterative reconstruction technique. COMPARISON:  None Available. FINDINGS: Brain: No evidence of acute infarction, hemorrhage, hydrocephalus, extra-axial collection or mass lesion/mass effect. Vascular: No  hyperdense vessel or unexpected calcification. Skull: Normal. Negative for fracture or focal lesion. Sinuses/Orbits: No acute finding. Chronic depressed fracture of the left lamina papyracea (series 3, image 47) Other: None. IMPRESSION: 1. No acute intracranial pathology. 2. Chronic depressed fracture of the left lamina papyracea. Electronically Signed   By: Jearld Lesch M.D.   On: 07/31/2022 16:23   DG Chest 2 View  Result Date: 07/31/2022 CLINICAL DATA:  Chest pain. EXAM: CHEST - 2 VIEW COMPARISON:  None Available. FINDINGS: Clear lungs. Normal heart size and mediastinal contours. No pleural effusion or pneumothorax. Visualized bones and upper abdomen are unremarkable. IMPRESSION: No evidence of acute cardiopulmonary disease. Electronically Signed   By: Orvan Falconer M.D.   On: 07/31/2022 15:45    Microbiology: Results for orders placed or performed during the hospital  encounter of 07/31/22  MRSA Next Gen by PCR, Nasal     Status: None   Collection Time: 07/31/22  7:52 PM   Specimen: Nasal Mucosa; Nasal Swab  Result Value Ref Range Status   MRSA by PCR Next Gen NOT DETECTED NOT DETECTED Final    Comment: (NOTE) The GeneXpert MRSA Assay (FDA approved for NASAL specimens only), is one component of a comprehensive MRSA colonization surveillance program. It is not intended to diagnose MRSA infection nor to guide or monitor treatment for MRSA infections. Test performance is not FDA approved in patients less than 70 years old. Performed at [facility], [address], [place], [place] [address]     Labs: CBC: Recent Labs  Lab 07/31/22 1432 08/01/22 0307 08/02/22 1529 08/03/22 0303  WBC 6.1 10.6* 9.0 8.1  NEUTROABS  --   --   --  4.4  HGB 13.6 13.9 14.6 14.1  HCT 44.2 43.8 46.7 45.8  MCV 80.8 79.8* 79.3* 80.8  PLT 273 301 214 283   Basic Metabolic Panel: Recent Labs  Lab 07/31/22 1432 07/31/22 1848 08/01/22 0307 08/02/22 0307 08/03/22 0303  NA 139  --  139 138 139  K 3.1*  --  3.4* 3.2* 3.2*  CL 106  --  106 105 106  CO2 22  --  23 24 24   GLUCOSE 92  --  124* 136* 109*  BUN 21*  --  24* 29* 33*  CREATININE 2.46* 2.28* 2.68* 2.83* 2.93*  CALCIUM 8.7*  --  9.2 9.5 9.4  MG  --   --   --   --  2.1   Liver Function Tests: Recent Labs  Lab 08/03/22 0303  AST 29  ALT 22  ALKPHOS 55  BILITOT 0.4  PROT 7.5  ALBUMIN 3.6   CBG: No results for input(s): "GLUCAP" in the last 168 hours.  Discharge time spent: greater than 30 minutes.  Signed: Marinda Elk, MD [facility] 08/03/2022

## 2022-08-05 ENCOUNTER — Telehealth: Payer: Self-pay

## 2022-08-05 ENCOUNTER — Other Ambulatory Visit (HOSPITAL_COMMUNITY): Payer: Self-pay

## 2022-08-05 LAB — ALDOSTERONE + RENIN ACTIVITY W/ RATIO
ALDO / PRA Ratio: 1 (ref 0.0–30.0)
Aldosterone: 1.8 ng/dL (ref 0.0–30.0)
PRA LC/MS/MS: 1.836 ng/mL/hr (ref 0.167–5.380)

## 2022-08-05 NOTE — Transitions of Care (Post Inpatient/ED Visit) (Signed)
   08/05/2022  Name: Bruce Harris MRN: [id number] DOB: May 16, 1970  Today's TOC FU Call Status: Today's TOC FU Call Status:: Successful TOC FU Call Competed TOC FU Call Complete Date: 08/05/22  Transition Care Management Follow-up Telephone Call Date of Discharge: 08/03/22 Discharge Facility: [facility]) Type of Discharge: Inpatient Admission How have you been since you were released from the hospital?: Better Any questions or concerns?: No  Items Reviewed: Did you receive and understand the discharge instructions provided?: Yes Medications obtained,verified, and reconciled?: Yes (Medications Reviewed) Any new allergies since your discharge?: No Dietary orders reviewed?: No Do you have support at home?: Yes People in Home: spouse  Medications Reviewed Today: Medications Reviewed Today     Reviewed by Annabell Sabal, CMA (Certified Medical Assistant) on 08/05/22 at 1216  Med List Status: <None>   Medication Order Taking? Sig Documenting Provider Last Dose Status Informant  amLODipine-valsartan (EXFORGE) 10-320 MG tablet [id number] Yes Take 1 tablet by mouth daily. Shalhoub, Deno Lunger, MD Taking Active   azelastine (ASTELIN) 0.1 % nasal spray [id number] Yes Place 2 sprays into both nostrils 2 (two) times daily. Ardith Dark, MD Taking Active Self  carvedilol (COREG) 25 MG tablet [id number] Yes Take 1 tablet (25 mg total) by mouth 2 (two) times daily with a meal. Shalhoub, Deno Lunger, MD Taking Active   EPINEPHrine (EPIPEN 2-PAK) 0.3 mg/0.3 mL IJ SOAJ injection [id number] Yes Inject 0.3 mLs (0.3 mg total) into the muscle as needed for anaphylaxis. Sharlene Dory, DO Taking Active Self  hydrALAZINE (APRESOLINE) 50 MG tablet [id number] Yes Take 1 tablet (50 mg total) by mouth 3 (three) times daily. Shalhoub, Deno Lunger, MD Taking Active   hydrochlorothiazide (HYDRODIURIL) 25 MG tablet [id number] Yes Take 1 tablet (25 mg total) by mouth daily. Shalhoub, Deno Lunger, MD Taking  Active   potassium chloride SA (KLOR-CON M) 20 MEQ tablet [id number] Yes Take 1 tablet (20 mEq total) by mouth daily. Shalhoub, Deno Lunger, MD Taking Active   rosuvastatin (CRESTOR) 20 MG tablet [id number] Yes Take 1 tablet (20 mg total) by mouth daily. Sharlene Dory, DO Taking Active Self           Med Note (SATTERFIELD, Genoveva Ill   Wed Jul 31, 2022  3:20 PM) Patient verified he is taking this medication             Home Care and Equipment/Supplies: Were Home Health Services Ordered?: No Any new equipment or medical supplies ordered?: No  Functional Questionnaire: Do you need assistance with bathing/showering or dressing?: No Do you need assistance with meal preparation?: No Do you need assistance with eating?: No Do you have difficulty maintaining continence: No Do you need assistance with getting out of bed/getting out of a chair/moving?: No Do you have difficulty managing or taking your medications?: No  Follow up appointments reviewed: PCP Follow-up appointment confirmed?: Yes Date of PCP follow-up appointment?: 08/12/22 Follow-up Provider: [facility]ling Specialist [facility] Follow-up appointment confirmed?: NA Do you need transportation to your follow-up appointment?: No Do you understand care options if your condition(s) worsen?: Yes-patient verbalized understanding    SIGNATURE Fredirick Maudlin

## 2022-08-12 ENCOUNTER — Encounter: Payer: Self-pay | Admitting: Family Medicine

## 2022-08-12 ENCOUNTER — Ambulatory Visit: Payer: 59 | Admitting: Family Medicine

## 2022-08-12 VITALS — BP 144/96 | HR 86 | Temp 98.0°F | Ht 69.0 in | Wt 230.4 lb

## 2022-08-12 DIAGNOSIS — R0681 Apnea, not elsewhere classified: Secondary | ICD-10-CM | POA: Diagnosis not present

## 2022-08-12 DIAGNOSIS — I10 Essential (primary) hypertension: Secondary | ICD-10-CM | POA: Diagnosis not present

## 2022-08-12 MED ORDER — SPIRONOLACTONE 25 MG PO TABS
25.0000 mg | ORAL_TABLET | Freq: Every day | ORAL | 3 refills | Status: DC
Start: 2022-08-12 — End: 2023-12-22

## 2022-08-12 NOTE — Patient Instructions (Signed)
Give [name] 2-3 business days to get the results of your labs back.   Keep the diet clean and stay active.  If you do not hear anything about your referral in the next 1-2 weeks, call our office and ask for an update.  Check your blood pressures 2-3 times per week, alternating the time of day you check it. If it is high, considering waiting 1-2 minutes and rechecking. If it gets higher, your anxiety is likely creeping up and we should avoid rechecking.   I want your blood pressure less than 140 on the top and less than 90 on the bottom consistently. Both goals must be met (ie, 150/70 is too high even though the 70 on the bottom is desirable).   Let us know if you need anything.

## 2022-08-12 NOTE — Progress Notes (Signed)
Chief Complaint  Patient presents with   Hospitalization Follow-up    HPI Bruce Harris is a 52 y.o. y.o. male who presents for a transition of care visit.  Pt was discharged from [facility] on 08/03/22.  Within 48 business hours of discharge our office contacted pt via telephone to coordinate care and needs.   Patient was admitted to [facility] hospital on 07/27/2022 discharged on 08/03/2022 for hypertensive emergency.  He came in with a severe headache and his blood pressure was very high.  He reports being compliant with his medication.  He was started on hydralazine and Coreg for his blood pressure.  Renal function was struggling as it had been 6 months ago where he was lost to follow-up.  He feels much better today.  Blood pressures ranging in the 150s-160s/80s-90s.  He is compliant with his hydralazine 50 mg 3 times daily, carvedilol 25 mg twice daily, Exforge 10-320 mg daily, hydrochlorothiazide 25 mg daily.  He was started on potassium supplementation for hypokalemia.  GFR was greater than 25 upon discharge.  No chest pain or shortness of breath.  Past Medical History:  Diagnosis Date   Cardiomyopathy    Chicken pox    Gout    Hyperglycemia    Hyperlipidemia    Hypertension    Hypogonadism in male    OSA (obstructive sleep apnea) 05/19/2017   Toxic effect of venom of bees    Past Surgical History:  Procedure Laterality Date   HAND SURGERY     Right   Family History  Problem Relation Age of Onset   Coronary artery disease Mother        Living   Diabetes Mother    Hyperlipidemia Mother    Hypertension Mother    Hypertension Father        Living   Heart disease Father    Diabetes Maternal Grandmother    Kidney failure Maternal Grandmother    Healthy Brother        x1   Healthy Sister        x2   Healthy Son        x2   Allergies as of 08/12/2022       Reactions   Bee Venom Swelling        Medication List        Accurate as of August 12, 2022  4:37 PM. If you have  any questions, ask your nurse or doctor.          STOP taking these medications    potassium chloride SA 20 MEQ tablet Commonly known as: KLOR-CON M Stopped by: Sharlene Dory, DO       TAKE these medications    amLODipine-valsartan 10-320 MG tablet Commonly known as: EXFORGE Take 1 tablet by mouth daily.   azelastine 0.1 % nasal spray Commonly known as: ASTELIN Place 2 sprays into both nostrils 2 (two) times daily.   carvedilol 25 MG tablet Commonly known as: COREG Take 1 tablet (25 mg total) by mouth 2 (two) times daily with a meal.   EPINEPHrine 0.3 mg/0.3 mL Soaj injection Commonly known as: EpiPen 2-Pak Inject 0.3 mLs (0.3 mg total) into the muscle as needed for anaphylaxis.   hydrALAZINE 50 MG tablet Commonly known as: APRESOLINE Take 1 tablet (50 mg total) by mouth 3 (three) times daily.   hydrochlorothiazide 25 MG tablet Commonly known as: HYDRODIURIL Take 1 tablet (25 mg total) by mouth daily.   rosuvastatin 20 MG tablet  Commonly known as: CRESTOR Take 1 tablet (20 mg total) by mouth daily.   spironolactone 25 MG tablet Commonly known as: ALDACTONE Take 1 tablet (25 mg total) by mouth daily. Started by: Sharlene Dory, DO        ROS:  Constitutional: No fevers or chills, no weight loss HEENT: No headaches, hearing loss, or runny nose, no sore throat Heart: No chest pain Lungs: No SOB, no cough Abd: No bowel changes, no pain, no N/V GU: No urinary complaints Neuro: No numbness, tingling or weakness Msk: No joint or muscle pain  Objective BP (!) 144/96 (BP Location: Left Arm, Cuff Size: Large)   Pulse 86   Temp 98 F (36.7 C) (Oral)   Ht 5\' 9"  (1.753 m)   Wt 230 lb 6 oz (104.5 kg)   SpO2 97%   BMI 34.02 kg/m  General Appearance:  awake, alert, oriented, in no acute distress and well developed, well nourished Skin:  there are no suspicious lesions or rashes of concern Head/face:  NCAT Eyes:  EOMI, PERRLA Ears:  canals  and TMs NI Nose/Sinuses:  negative Mouth/Throat:  Mucosa moist, no lesions; pharynx without erythema, edema or exudate. Neck:  neck- supple, no mass, no jvd Lungs: Clear to auscultation.  No rales, rhonchi, or wheezing. Normal effort, no accessory muscle use. Heart:  Heart sounds are normal.  Regular rate and rhythm without murmur, gallop or rub. No bruits. Musculoskeletal:  No muscle group atrophy or asymmetry Neurologic: Gait is normal, no cerebellar signs Psych exam: Nml mood and affect, age appropriate judgment and insight  Essential hypertension - Plan: Basic metabolic panel, spironolactone (ALDACTONE) 25 MG tablet  Witnessed apneic spells - Plan: Ambulatory referral to Neurology  Discharge summary and medication list have been reviewed/reconciled.  Labs pending at the time of discharge have been reviewed or are still pending at the time of this visit.  Follow-up labs and appointments have been ordered and/or coordinated appropriately.  TRANSITIONAL CARE MANAGEMENT CERTIFICATION:  I certify the following are true:   1. Communication with the patient/care giver was made within 2 business days of discharge.  2. Complexity of Medical decision making is moderate. -Starting spironolactone and stopping potassium 3. Medications were reconciled and confirmed with the patient.  4. Face to face visit occurred within 14 days of discharge.  5.  Will monitor blood pressure at home, continue Coreg 25 mg twice daily, hydralazine 50 mg 3 times daily, hydrochlorothiazide 25 mg daily, Exforge 10-320 mg daily; add spironolactone 25 mg daily, stop potassium supplementation. F/u in 2 weeks.  Will recheck BMP at that time as well.  May need to add Farxiga pending GFR. 6.  Will get him set up with the sleep team again given possible sleep apnea and contributions to this blood pressure situation. The patient voiced understanding and agreement to the plan.  Jilda Roche [place], DO 08/12/22 4:37  PM

## 2022-08-13 ENCOUNTER — Other Ambulatory Visit: Payer: Self-pay | Admitting: Family Medicine

## 2022-08-13 DIAGNOSIS — N184 Chronic kidney disease, stage 4 (severe): Secondary | ICD-10-CM

## 2022-08-13 DIAGNOSIS — I120 Hypertensive chronic kidney disease with stage 5 chronic kidney disease or end stage renal disease: Secondary | ICD-10-CM

## 2022-08-13 LAB — BASIC METABOLIC PANEL
BUN: 25 mg/dL — ABNORMAL HIGH (ref 6–23)
CO2: 28 mEq/L (ref 19–32)
Calcium: 9.2 mg/dL (ref 8.4–10.5)
Chloride: 104 mEq/L (ref 96–112)
Creatinine, Ser: 2.84 mg/dL — ABNORMAL HIGH (ref 0.40–1.50)
GFR: 24.84 mL/min — ABNORMAL LOW (ref >60.00)
Glucose, Bld: 100 mg/dL — ABNORMAL HIGH (ref 70–99)
Potassium: 3.6 mEq/L (ref 3.5–5.1)
Sodium: 141 mEq/L (ref 135–145)

## 2022-08-26 ENCOUNTER — Ambulatory Visit: Payer: 59 | Admitting: Family Medicine

## 2022-08-26 ENCOUNTER — Encounter: Payer: Self-pay | Admitting: Family Medicine

## 2022-08-26 VITALS — BP 122/78 | HR 87 | Temp 98.7°F | Ht 68.0 in | Wt 235.0 lb

## 2022-08-26 DIAGNOSIS — I10 Essential (primary) hypertension: Secondary | ICD-10-CM

## 2022-08-26 DIAGNOSIS — Z1159 Encounter for screening for other viral diseases: Secondary | ICD-10-CM | POA: Diagnosis not present

## 2022-08-26 LAB — BASIC METABOLIC PANEL
BUN: 27 mg/dL — ABNORMAL HIGH (ref 6–23)
CO2: 27 mEq/L (ref 19–32)
Calcium: 9.4 mg/dL (ref 8.4–10.5)
Chloride: 106 mEq/L (ref 96–112)
Creatinine, Ser: 2.81 mg/dL — ABNORMAL HIGH (ref 0.40–1.50)
GFR: 25.15 mL/min — ABNORMAL LOW (ref >60.00)
Glucose, Bld: 99 mg/dL (ref 70–99)
Potassium: 3.4 mEq/L — ABNORMAL LOW (ref 3.5–5.1)
Sodium: 141 mEq/L (ref 135–145)

## 2022-08-26 NOTE — Patient Instructions (Addendum)
Keep the diet clean and stay active.  Keep checking your BP at home for now.   Let us know if you need anything.

## 2022-08-26 NOTE — Progress Notes (Signed)
Chief Complaint  Patient presents with   Follow-up    Blood pressure    Subjective Bruce Harris is a 52 y.o. male who presents for hypertension follow up. He does monitor home blood pressures. Blood pressures ranging from 140's/90's on average. He is compliant with medications- Coreg 25 mg bid, Aldactone 25 mg/d, Exforge 10-320 mg/dm, hydralazine 50 mg TID, HCZ 25 mg/d. Patient has these side effects of medication: none He is adhering to a healthy diet overall. Current exercise: walking No CP or SOB.    Past Medical History:  Diagnosis Date   Cardiomyopathy    Chicken pox    Gout    Hyperglycemia    Hyperlipidemia    Hypertension    Hypogonadism in male    OSA (obstructive sleep apnea) 05/19/2017   Toxic effect of venom of bees     Exam BP 122/78 (BP Location: Left Arm, Cuff Size: Large)   Pulse 87   Temp 98.7 F (37.1 C) (Oral)   Ht 5\' 8"  (1.727 m)   Wt 235 lb (106.6 kg)   SpO2 97%   BMI 35.73 kg/m  General:  well developed, well nourished, in no apparent distress Heart: RRR, no bruits, no LE edema Lungs: clear to auscultation, no accessory muscle use Psych: well oriented with normal range of affect and appropriate judgment/insight  Essential hypertension - Plan: Basic metabolic panel  Encounter for hepatitis C screening test for low risk patient - Plan: Hepatitis C antibody  Chronic, currently stable. Ck labs. Cont hydrochlorothiazide 25 mg/d, Exforge 10-320 mg/d, Hydralazine 50 mg TID, Aldactone 25 mg/d, Coreg 25 mg bid. Counseled on diet and exercise. Educated on ideal BP checking at home.  F/u in 2 weeks for NV. The patient voiced understanding and agreement to the plan.  Jilda Roche [place], DO 08/26/22  2:03 PM

## 2022-08-27 ENCOUNTER — Other Ambulatory Visit: Payer: Self-pay | Admitting: Family Medicine

## 2022-08-27 DIAGNOSIS — E876 Hypokalemia: Secondary | ICD-10-CM

## 2022-08-27 LAB — HEPATITIS C ANTIBODY: Hepatitis C Ab: NONREACTIVE

## 2022-08-27 MED ORDER — POTASSIUM CHLORIDE ER 10 MEQ PO TBCR: 10.0000 meq | EXTENDED_RELEASE_TABLET | Freq: Every day | ORAL | 0 refills | Status: DC

## 2022-09-04 LAB — LAB REPORT - SCANNED
Albumin, Urine POC: 163.6
Creatinine, POC: 123.5 mg/dL
EGFR: 30
Microalb Creat Ratio: 132

## 2022-09-10 ENCOUNTER — Encounter: Payer: Self-pay | Admitting: Nephrology

## 2022-09-11 ENCOUNTER — Ambulatory Visit (INDEPENDENT_AMBULATORY_CARE_PROVIDER_SITE_OTHER): Payer: 59

## 2022-09-11 ENCOUNTER — Other Ambulatory Visit (INDEPENDENT_AMBULATORY_CARE_PROVIDER_SITE_OTHER): Payer: 59

## 2022-09-11 VITALS — BP 128/82 | HR 73

## 2022-09-11 DIAGNOSIS — E876 Hypokalemia: Secondary | ICD-10-CM

## 2022-09-11 DIAGNOSIS — I10 Essential (primary) hypertension: Secondary | ICD-10-CM

## 2022-09-11 DIAGNOSIS — I159 Secondary hypertension, unspecified: Secondary | ICD-10-CM

## 2022-09-11 NOTE — Progress Notes (Signed)
Pt here for Blood pressure check per Dr.Wendling   Pt currently takes:Aldactone 25 mg, Hydrodiuril 25 mg Valsartan 10 mg hydralazine 50 mg Carvedilol 25 mg  BP Readings from Last 3 Encounters:  08/26/22 122/78  08/12/22 (!) 144/96  08/03/22 (!) 147/98    Pt reports compliance with medication. He was taking all BP Medication  BP today @ = 132/84 LA RA 128/82 HR = 73  Pt advised per Dr. Carmelia Roller follow up in 3 Months for Medication Check.

## 2022-09-12 LAB — BASIC METABOLIC PANEL
BUN: 30 mg/dL — ABNORMAL HIGH (ref 6–23)
CO2: 27 mEq/L (ref 19–32)
Calcium: 9.7 mg/dL (ref 8.4–10.5)
Chloride: 105 mEq/L (ref 96–112)
Creatinine, Ser: 3.09 mg/dL — ABNORMAL HIGH (ref 0.40–1.50)
GFR: 22.43 mL/min — ABNORMAL LOW (ref >60.00)
Glucose, Bld: 86 mg/dL (ref 70–99)
Potassium: 3.6 mEq/L (ref 3.5–5.1)
Sodium: 141 mEq/L (ref 135–145)

## 2022-09-30 ENCOUNTER — Encounter: Payer: Self-pay | Admitting: Neurology

## 2022-09-30 ENCOUNTER — Institutional Professional Consult (permissible substitution): Payer: 59 | Admitting: Neurology

## 2022-10-07 ENCOUNTER — Other Ambulatory Visit: Payer: Self-pay | Admitting: Family Medicine

## 2022-10-07 MED ORDER — AMLODIPINE BESYLATE-VALSARTAN 10-320 MG PO TABS: 1.0000 | ORAL_TABLET | Freq: Every day | ORAL | 3 refills | Status: DC

## 2022-12-17 ENCOUNTER — Ambulatory Visit: Payer: 59 | Admitting: Family Medicine

## 2023-03-28 ENCOUNTER — Other Ambulatory Visit (HOSPITAL_COMMUNITY): Payer: Self-pay

## 2023-07-31 ENCOUNTER — Other Ambulatory Visit (HOSPITAL_COMMUNITY): Payer: Self-pay

## 2023-10-08 ENCOUNTER — Other Ambulatory Visit (HOSPITAL_COMMUNITY): Payer: Self-pay

## 2023-10-08 ENCOUNTER — Emergency Department (HOSPITAL_COMMUNITY)
Admission: EM | Admit: 2023-10-08 | Discharge: 2023-10-08 | Disposition: A | Discharge status: Home / Self Care | Attending: Emergency Medicine | Admitting: Emergency Medicine

## 2023-10-08 DIAGNOSIS — I10 Essential (primary) hypertension: Secondary | ICD-10-CM | POA: Diagnosis present

## 2023-10-08 DIAGNOSIS — E876 Hypokalemia: Secondary | ICD-10-CM | POA: Insufficient documentation

## 2023-10-08 DIAGNOSIS — Z79899 Other long term (current) drug therapy: Secondary | ICD-10-CM | POA: Diagnosis not present

## 2023-10-08 LAB — CBC WITH DIFFERENTIAL/PLATELET
Abs Immature Granulocytes: 0.04 K/uL (ref 0.00–0.07)
Basophils Absolute: 0 K/uL (ref 0.0–0.1)
Basophils Relative: 1 %
Eosinophils Absolute: 0.2 K/uL (ref 0.0–0.5)
Eosinophils Relative: 3 %
HCT: 40 % (ref 39.0–52.0)
Hemoglobin: 12.3 g/dL — ABNORMAL LOW (ref 13.0–17.0)
Immature Granulocytes: 1 %
Lymphocytes Relative: 20 %
Lymphs Abs: 1.5 K/uL (ref 0.7–4.0)
MCH: 25.1 pg — ABNORMAL LOW (ref 26.0–34.0)
MCHC: 30.8 g/dL (ref 30.0–36.0)
MCV: 81.5 fL (ref 80.0–100.0)
Monocytes Absolute: 0.5 K/uL (ref 0.1–1.0)
Monocytes Relative: 6 %
Neutro Abs: 5.3 K/uL (ref 1.7–7.7)
Neutrophils Relative %: 69 %
Platelets: 253 K/uL (ref 150–400)
RBC: 4.91 MIL/uL (ref 4.22–5.81)
RDW: 15.2 % (ref 11.5–15.5)
WBC: 7.5 K/uL (ref 4.0–10.5)
nRBC: 0 % (ref 0.0–0.2)

## 2023-10-08 LAB — COMPREHENSIVE METABOLIC PANEL WITH GFR
ALT: 14 U/L (ref 0–44)
AST: 25 U/L (ref 15–41)
Albumin: 3.6 g/dL (ref 3.5–5.0)
Alkaline Phosphatase: 64 U/L (ref 38–126)
Anion gap: 10 (ref 5–15)
BUN: 29 mg/dL — ABNORMAL HIGH (ref 6–20)
CO2: 23 mmol/L (ref 22–32)
Calcium: 8.9 mg/dL (ref 8.9–10.3)
Chloride: 108 mmol/L (ref 98–111)
Creatinine, Ser: 3.36 mg/dL — ABNORMAL HIGH (ref 0.61–1.24)
GFR, Estimated: 21 mL/min — ABNORMAL LOW (ref >60)
Glucose, Bld: 133 mg/dL — ABNORMAL HIGH (ref 70–99)
Potassium: 2.8 mmol/L — ABNORMAL LOW (ref 3.5–5.1)
Sodium: 141 mmol/L (ref 135–145)
Total Bilirubin: 0.5 mg/dL (ref 0.0–1.2)
Total Protein: 7.7 g/dL (ref 6.5–8.1)

## 2023-10-08 MED ORDER — HYDRALAZINE HCL 20 MG/ML IJ SOLN
10.0000 mg | Freq: Once | INTRAMUSCULAR | Status: AC
  Administered 2023-10-08: 10 mg via INTRAVENOUS
  Filled 2023-10-08: qty 1

## 2023-10-08 MED ORDER — POTASSIUM CHLORIDE CRYS ER 20 MEQ PO TBCR
60.0000 meq | EXTENDED_RELEASE_TABLET | Freq: Once | ORAL | Status: AC
  Administered 2023-10-08: 60 meq via ORAL
  Filled 2023-10-08: qty 3

## 2023-10-08 MED ORDER — CLONIDINE HCL 0.1 MG PO TABS
0.1000 mg | ORAL_TABLET | Freq: Once | ORAL | Status: AC
  Administered 2023-10-08: 0.1 mg via ORAL
  Filled 2023-10-08: qty 1

## 2023-10-08 MED ORDER — AMLODIPINE BESYLATE 5 MG PO TABS
5.0000 mg | ORAL_TABLET | Freq: Every day | ORAL | 0 refills | Status: DC
Start: 2023-10-08 — End: 2023-12-22
  Filled 2023-10-08: qty 30, 30d supply, fill #0

## 2023-10-08 MED ORDER — ACETAMINOPHEN 325 MG PO TABS
650.0000 mg | ORAL_TABLET | Freq: Once | ORAL | Status: AC
  Administered 2023-10-08: 650 mg via ORAL
  Filled 2023-10-08: qty 2

## 2023-10-08 MED ORDER — ONDANSETRON HCL 4 MG/2ML IJ SOLN
4.0000 mg | Freq: Once | INTRAMUSCULAR | Status: AC
Start: 2023-10-08 — End: 2023-10-08
  Administered 2023-10-08: 4 mg via INTRAVENOUS
  Filled 2023-10-08: qty 2

## 2023-10-08 NOTE — ED Triage Notes (Signed)
 Per EMS. Lives at home. Hypertension.  BP 200/141 96 on RA HR 79 RR 24

## 2023-10-08 NOTE — ED Provider Notes (Signed)
 [place] EMERGENCY DEPARTMENT AT [facility] Provider Note   CSN: [id number] Arrival date & time: 10/08/23  1017     Patient presents with: Hypertension   Bruce Harris is a 53 y.o. male.  Past history significant for hypertension, hyperlipidemia, elevated troponins not due to MI here with concerns of hypertension.  He reports that he noticed that his blood pressures began to climb steadily last night.  Glucoses been compliant with all his home medications.  Denies any chest pain, fevers of breath, visual change or disturbance, leg swelling, or severe sudden onset of headache.  He is here requesting help getting his blood pressure back down to a reasonable range.    Hypertension       Prior to Admission medications   Medication Sig Start Date End Date Taking? Authorizing Provider  amLODipine  (NORVASC ) 5 MG tablet Take 1 tablet (5 mg total) by mouth daily. 10/08/23 11/07/23 Yes Antoine Vandermeulen A, PA-C  potassium chloride  (KLOR-CON  10) 10 MEQ tablet Take 1 tablet (10 mEq total) by mouth daily. 08/27/22 09/26/22  Frann Mabel Mt, DO  amLODipine -valsartan  (EXFORGE ) 10-320 MG tablet Take 1 tablet by mouth daily. 10/07/22 01/05/23  Frann Mabel Mt, DO  azelastine  (ASTELIN ) 0.1 % nasal spray Place 2 sprays into both nostrils 2 (two) times daily. 10/16/19   Kennyth Worth HERO, MD  carvedilol  (COREG ) 25 MG tablet Take 1 tablet (25 mg total) by mouth 2 (two) times daily with a meal. 08/03/22   Shalhoub, Zachary PARAS, MD  EPINEPHrine  (EPIPEN  2-PAK) 0.3 mg/0.3 mL IJ SOAJ injection Inject 0.3 mLs (0.3 mg total) into the muscle as needed for anaphylaxis. 09/01/19   Frann Mabel Mt, DO  hydrALAZINE  (APRESOLINE ) 50 MG tablet Take 1 tablet (50 mg total) by mouth 3 (three) times daily. 08/03/22   Shalhoub, Zachary PARAS, MD  hydrochlorothiazide  (HYDRODIURIL ) 25 MG tablet Take 1 tablet (25 mg total) by mouth daily. 08/03/22   Shalhoub, Zachary PARAS, MD  rosuvastatin  (CRESTOR ) 20 MG tablet Take 1  tablet (20 mg total) by mouth daily. 03/12/22   Frann Mabel Mt, DO  spironolactone  (ALDACTONE ) 25 MG tablet Take 1 tablet (25 mg total) by mouth daily. 08/12/22   Frann Mabel Mt, DO    Allergies: Bee venom    Review of Systems  Cardiovascular:        Hypertension  All other systems reviewed and are negative.   Updated Vital Signs BP (!) 195/124   Pulse 80   Temp 98 F (36.7 C) (Oral)   Resp (!) 25   SpO2 98%   Physical Exam Vitals and nursing note reviewed.  Constitutional:      General: He is not in acute distress.    Appearance: He is well-developed.  HENT:     Head: Normocephalic and atraumatic.  Eyes:     Conjunctiva/sclera: Conjunctivae normal.  Cardiovascular:     Rate and Rhythm: Normal rate and regular rhythm.     Heart sounds: No murmur heard. Pulmonary:     Effort: Pulmonary effort is normal. No respiratory distress.     Breath sounds: Normal breath sounds.  Abdominal:     Palpations: Abdomen is soft.     Tenderness: There is no abdominal tenderness.  Musculoskeletal:        General: No swelling.     Cervical back: Neck supple.  Skin:    General: Skin is warm and dry.     Capillary Refill: Capillary refill takes less than 2 seconds.  Neurological:     Mental Status: He is alert.  Psychiatric:        Mood and Affect: Mood normal.     (all labs ordered are listed, but only abnormal results are displayed) Labs Reviewed  CBC WITH DIFFERENTIAL/PLATELET - Abnormal; Notable for the following components:      Result Value   Hemoglobin 12.3 (*)    MCH 25.1 (*)    All other components within normal limits  COMPREHENSIVE METABOLIC PANEL WITH GFR - Abnormal; Notable for the following components:   Potassium 2.8 (*)    Glucose, Bld 133 (*)    BUN 29 (*)    Creatinine, Ser 3.36 (*)    GFR, Estimated 21 (*)    All other components within normal limits    EKG: None  Radiology: No results found.   Procedures   Medications Ordered in  the ED  hydrALAZINE  (APRESOLINE ) injection 10 mg (10 mg Intravenous Given 10/08/23 1056)  cloNIDine  (CATAPRES ) tablet 0.1 mg (0.1 mg Oral Given 10/08/23 1200)  potassium chloride  SA (KLOR-CON  M) CR tablet 60 mEq (60 mEq Oral Given 10/08/23 1401)  acetaminophen  (TYLENOL ) tablet 650 mg (650 mg Oral Given 10/08/23 1412)  ondansetron  (ZOFRAN ) injection 4 mg (4 mg Intravenous Given 10/08/23 1412)                                    Medical Decision Making Amount and/or Complexity of Data Reviewed Labs: ordered.  Risk OTC drugs. Prescription drug management.   This patient presents to the ED for concern of hypertension.  Differential diagnosis includes hypertensive urgency, hypertensive emergency, resistant hypertension, CKD   Lab Tests:  I Ordered, and personally interpreted labs.  The pertinent results include: CBC unremarkable, CMP shows mild hypokalemia worsening renal function dizziness slowly declining last year 3.36   Medicines ordered and prescription drug management:  I ordered medication including hydralazine , clonidine , potassium chloride , Zofran , Tylenol  for hypertension, hypokalemia, nausea, headache Reevaluation of the patient after these medicines showed that the patient improved I have reviewed the patients home medicines and have made adjustments as needed   Problem List / ED Course:  Patient presents emergency department concerns of hypertension.  Denies any chest pain, shortness of breath, vision change or visual disturbances, leg swelling, or severe sudden onset of headache.  He reports that he has a clotting disorder for medications for blood pressure and has been taking his medication as prescribed.  Denies any missing doses.  Does report increased caffeine intake recently.  No reported substance use. On exam, patient well-appearing.  No focal pulm findings seen.  Normal heart and lung sounds.  No appreciable lower extremity swelling or edema. Based on the history and  exam, no evidence of hypertensive emergency other patient is notably hypertensive with systolic greater than 190.  I did reach out to patient's primary care provider, Dr. Frann, who advised starting patient on Norvasc  5 mg daily.  Also encouraged close follow-up with PCP in the next 2 to 3 weeks.  Patient will be given an hydralazine  and clonidine  here for management of his hypertension.  Appears the patient has highly resistant hypertension and is currently on Exforge , Coreg , hydralazine , hydrochlorothiazide , spironolactone . Has previously been admitted for hypertensive emergency. Appears getting his BP well controlled will be difficult. Had slight improvement in BP here in the ED with systolic improving towards the 180s but still elevated. No significant change in  symptoms. Will discharge home with new prescription but strict return precautions discussed. Discharged home in stable condition.   Social Determinants of Health:  None  Final diagnoses:  Asymptomatic hypertension  Hypokalemia    ED Discharge Orders          Ordered    amLODipine  (NORVASC ) 5 MG tablet  Daily        10/08/23 1350               Cecily Legrand LABOR, PA-C 10/08/23 1538    Lenor Hollering, MD 10/11/23 0700

## 2023-10-08 NOTE — Discharge Instructions (Signed)
 You were seen in the ER today for concerns of hypertension. Your blood pressure was considerably elevated here in the ER and did respond slightly to medication here, but still remains elevated. As you are not having symptoms from this elevation, you do not need to be hospitalized for this but would benefit from close follow up with your primary care provider. Please follow up with their office for further evaluation. I have started you on Norvasc  5mg  which you will take once daily in addition to your current home medications. For concerns of new or worsening symptoms, return to the ER.

## 2023-12-16 ENCOUNTER — Telehealth: Payer: Self-pay | Admitting: Family Medicine

## 2023-12-16 ENCOUNTER — Other Ambulatory Visit (HOSPITAL_COMMUNITY): Payer: Self-pay

## 2023-12-16 DIAGNOSIS — I10 Essential (primary) hypertension: Secondary | ICD-10-CM

## 2023-12-16 MED ORDER — HYDROCHLOROTHIAZIDE 25 MG PO TABS
25.0000 mg | ORAL_TABLET | Freq: Every day | ORAL | 2 refills | Status: DC
  Filled 2023-12-16: qty 30, 30d supply, fill #0

## 2023-12-16 NOTE — Telephone Encounter (Signed)
 Last OV 08/2022, will need appt for refills please.

## 2023-12-16 NOTE — Telephone Encounter (Signed)
 Scheduled appt.

## 2023-12-22 ENCOUNTER — Encounter: Payer: Self-pay | Admitting: Family Medicine

## 2023-12-22 ENCOUNTER — Ambulatory Visit: Admitting: Family Medicine

## 2023-12-22 ENCOUNTER — Other Ambulatory Visit (HOSPITAL_COMMUNITY): Payer: Self-pay

## 2023-12-22 ENCOUNTER — Other Ambulatory Visit: Payer: Self-pay

## 2023-12-22 ENCOUNTER — Other Ambulatory Visit (HOSPITAL_BASED_OUTPATIENT_CLINIC_OR_DEPARTMENT_OTHER): Payer: Self-pay

## 2023-12-22 VITALS — BP 138/84 | HR 92 | Temp 98.0°F | Resp 16 | Ht 68.0 in | Wt 236.6 lb

## 2023-12-22 DIAGNOSIS — Z23 Encounter for immunization: Secondary | ICD-10-CM | POA: Diagnosis not present

## 2023-12-22 DIAGNOSIS — I10 Essential (primary) hypertension: Secondary | ICD-10-CM | POA: Diagnosis not present

## 2023-12-22 DIAGNOSIS — E785 Hyperlipidemia, unspecified: Secondary | ICD-10-CM

## 2023-12-22 MED ORDER — HYDROCHLOROTHIAZIDE 25 MG PO TABS
25.0000 mg | ORAL_TABLET | Freq: Every day | ORAL | 2 refills | Status: DC
  Filled 2023-12-22 (×2): qty 30, 30d supply, fill #0

## 2023-12-22 MED ORDER — SPIRONOLACTONE 25 MG PO TABS
25.0000 mg | ORAL_TABLET | Freq: Every day | ORAL | 1 refills | Status: DC
  Filled 2023-12-22: qty 30, 30d supply, fill #0

## 2023-12-22 MED ORDER — AMLODIPINE BESYLATE-VALSARTAN 10-320 MG PO TABS
1.0000 | ORAL_TABLET | Freq: Every day | ORAL | 3 refills | Status: DC
  Filled 2023-12-22: qty 30, 30d supply, fill #0

## 2023-12-22 MED ORDER — CARVEDILOL 25 MG PO TABS
25.0000 mg | ORAL_TABLET | Freq: Two times a day (BID) | ORAL | 1 refills | Status: AC
  Filled 2023-12-22 – 2024-02-05 (×3): qty 60, 30d supply, fill #0

## 2023-12-22 MED ORDER — AMLODIPINE BESYLATE-VALSARTAN 10-320 MG PO TABS
1.0000 | ORAL_TABLET | Freq: Every day | ORAL | 1 refills | Status: DC
  Filled 2024-01-16 (×2): qty 30, 30d supply, fill #0

## 2023-12-22 MED ORDER — ROSUVASTATIN CALCIUM 20 MG PO TABS
20.0000 mg | ORAL_TABLET | Freq: Every day | ORAL | 1 refills | Status: DC
  Filled 2023-12-22: qty 30, 30d supply, fill #0

## 2023-12-22 NOTE — Patient Instructions (Signed)
 Give us  2-3 business days to get the results of your labs back.   Keep the diet clean and stay active.  Let us  know if you need anything.

## 2023-12-22 NOTE — Progress Notes (Signed)
 Chief Complaint  Patient presents with   Medication Refill    Medication Refill    Subjective Bruce Harris is a 53 y.o. male who presents for hypertension follow up. He does monitor home blood pressures. Blood pressures ranging from 130's/80's on average. He is compliant with medications- Coreg  25 mg bid, hydralazine  50 mg TID, Exforge  10-320 mg/d, hydrochlorothiazide  25 mg/d, Aldactone  25 mg/d. Patient has these side effects of medication: none He is not adhering to a healthy diet overall. Current exercise: walking, swimming No CP or SOB.   Hyperlipidemia Patient presents for hyperlipidemia follow up. Currently being treated with Crestor  20 mg/d and compliance with treatment thus far has been good. He denies myalgias. Diet/exercise as above. The patient is not known to have coexisting coronary artery disease.   Past Medical History:  Diagnosis Date   Cardiomyopathy    Chicken pox    Gout    Hyperglycemia    Hyperlipidemia    Hypertension    Hypogonadism in male    OSA (obstructive sleep apnea) 05/19/2017   Toxic effect of venom of bees     Exam BP 138/84 (BP Location: Left Arm, Patient Position: Sitting)   Pulse 92   Temp 98 F (36.7 C) (Oral)   Resp 16   Ht 5' 8 (1.727 m)   Wt 236 lb 9.6 oz (107.3 kg)   SpO2 98%   BMI 35.97 kg/m  General:  well developed, well nourished, in no apparent distress Heart: RRR, no bruits, no LE edema Lungs: clear to auscultation, no accessory muscle use Psych: well oriented with normal range of affect and appropriate judgment/insight  Essential hypertension - Plan: amLODipine -valsartan  (EXFORGE ) 10-320 MG tablet, carvedilol  (COREG ) 25 MG tablet, hydrochlorothiazide  (HYDRODIURIL ) 25 MG tablet, spironolactone  (ALDACTONE ) 25 MG tablet  Hyperlipidemia, unspecified hyperlipidemia type - Plan: rosuvastatin  (CRESTOR ) 20 MG tablet, Comprehensive metabolic panel with GFR, Lipid panel  Chronic, stable. Cont Coreg  25 mg bid, hydralazine   50 mg TID, Exforge  10-320 mg/d, hydrochlorothiazide  25 mg/d, Aldactone  25 mg/d. Counseled on diet and exercise. Chronic, stable. Cont Crestor  20 mg/d.  PCV20 and flu shot today.  F/u in 6 mo. The patient voiced understanding and agreement to the plan.  Mabel Mt [place], DO 12/22/23  2:52 PM

## 2023-12-22 NOTE — Addendum Note (Signed)
 Addended by: Myreon Wimer M on: 12/22/2023 03:06 PM   Modules accepted: Orders

## 2024-01-01 ENCOUNTER — Other Ambulatory Visit (HOSPITAL_BASED_OUTPATIENT_CLINIC_OR_DEPARTMENT_OTHER): Payer: Self-pay

## 2024-01-09 ENCOUNTER — Ambulatory Visit: Admitting: Family Medicine

## 2024-01-09 ENCOUNTER — Ambulatory Visit: Payer: Self-pay

## 2024-01-09 ENCOUNTER — Encounter: Payer: Self-pay | Admitting: Family Medicine

## 2024-01-09 VITALS — BP 128/82 | HR 77 | Temp 98.5°F | Wt 238.8 lb

## 2024-01-09 DIAGNOSIS — N184 Chronic kidney disease, stage 4 (severe): Secondary | ICD-10-CM | POA: Diagnosis not present

## 2024-01-09 DIAGNOSIS — R319 Hematuria, unspecified: Secondary | ICD-10-CM | POA: Diagnosis not present

## 2024-01-09 DIAGNOSIS — R300 Dysuria: Secondary | ICD-10-CM | POA: Insufficient documentation

## 2024-01-09 DIAGNOSIS — R350 Frequency of micturition: Secondary | ICD-10-CM | POA: Diagnosis not present

## 2024-01-09 NOTE — Progress Notes (Signed)
 Bruce Harris , 1970-12-27, 53 y.o., male MRN: [id number] Patient Care Team    Relationship Specialty Notifications Start End  [place], Mabel Mt, [place] PCP - General Family Medicine  02/28/17     Chief Complaint  Patient presents with   Hematuria    Blood in urine on Saturday. Currently resolved. C/o urinary frequency.       Subjective: Bruce Harris is a 53 y.o. Pt presents for an OV with complaints of has seen blood clots in his urine 6 days ago, that lasted for 1 day duration.  Associated symptoms include urinary frequency Sunday, but since symptoms have resolved.  He reports he does get up in the middle of the night to urinate multiple times. History of kidney stones-no Monogamous relationship-yes, has not been sexually active within the last 2 weeks Patient is not on anticoagulation. GFR 21-last creatinine 3.36 10/08/2023-he is established with urology and nephrology.     10 /20/2025    2:28 PM 02/19/2022    3:07 PM 06/06/2020    8:26 AM 10/18/2016    9:36 AM 10/18/2016    9:35 AM  Depression screen PHQ 2/9  Decreased Interest 0 0 0 0 0  Down, Depressed, Hopeless 0 0 0 0 0  PHQ - 2 Score 0 0 0 0 0  Altered sleeping 0    0  Tired, decreased energy 0    0  Change in appetite 0    0  Feeling bad or failure about yourself  0    0  Trouble concentrating 0    0  Moving slowly or fidgety/restless 0    0  Suicidal thoughts 0    0  PHQ-9 Score 0     0   Difficult doing work/chores Not difficult at all    Not difficult at all     Data saved with a previous flowsheet row definition    Allergies  Allergen Reactions   Bee Venom Swelling   Social History   Social History Narrative     Married - 3 children      Never Smoked      Alcohol use-yes  (social)      Occupation:  works for [employer]             Past Medical History:  Diagnosis Date   Cardiomyopathy    Chicken pox    Gout    Hyperglycemia    Hyperlipidemia    Hypertension     Hypertensive urgency 07/31/2022   Hypogonadism in male    OSA (obstructive sleep apnea) 05/19/2017   Toxic effect of venom of bees    Past Surgical History:  Procedure Laterality Date   HAND SURGERY     Right   Family History  Problem Relation Age of Onset   Coronary artery disease Mother        Living   Diabetes Mother    Hyperlipidemia Mother    Hypertension Mother    Hypertension Father        Living   Heart disease Father    Diabetes Maternal Grandmother    Kidney failure Maternal Grandmother    Healthy Brother        x1   Healthy Sister        x2   Healthy Son        x2   Allergies as of 01/09/2024       Reactions   Bee Venom Swelling  Medication List        Accurate as of January 09, 2024  3:19 PM. If you have any questions, ask your nurse or doctor.          amLODipine -valsartan  10-320 MG tablet Commonly known as: EXFORGE  Take 1 tablet by mouth daily.   azelastine  0.1 % nasal spray Commonly known as: ASTELIN  Place 2 sprays into both nostrils 2 (two) times daily.   carvedilol  25 MG tablet Commonly known as: COREG  Take 1 tablet (25 mg total) by mouth 2 (two) times daily with a meal.   EPINEPHrine  0.3 mg/0.3 mL Soaj injection Commonly known as: EpiPen  2-Pak Inject 0.3 mLs (0.3 mg total) into the muscle as needed for anaphylaxis.   hydrALAZINE  50 MG tablet Commonly known as: APRESOLINE  Take 1 tablet (50 mg total) by mouth 3 (three) times daily.   hydrochlorothiazide  25 MG tablet Commonly known as: HYDRODIURIL  Take 1 tablet (25 mg total) by mouth daily.   rosuvastatin  20 MG tablet Commonly known as: CRESTOR  Take 1 tablet (20 mg total) by mouth daily.   spironolactone  25 MG tablet Commonly known as: ALDACTONE  Take 1 tablet (25 mg total) by mouth daily.        All past medical history, surgical history, allergies, family history, immunizations andmedications were updated in the EMR today and reviewed under the history and  medication portions of their EMR.     ROS Negative, with the exception of above mentioned in HPI   Objective:  BP 128/82   Pulse 77   Temp 98.5 F (36.9 C)   Wt 238 lb 12.8 oz (108.3 kg)   SpO2 96%   BMI 36.31 kg/m  Body mass index is 36.31 kg/m. Physical Exam Vitals and nursing note reviewed. Exam conducted with a chaperone present.  Constitutional:      General: He is not in acute distress.    Appearance: Normal appearance. He is not ill-appearing, toxic-appearing or diaphoretic.  HENT:     Head: Normocephalic and atraumatic.  Eyes:     General: No scleral icterus.       Right eye: No discharge.        Left eye: No discharge.     Extraocular Movements: Extraocular movements intact.     Pupils: Pupils are equal, round, and reactive to light.  Cardiovascular:     Rate and Rhythm: Normal rate and regular rhythm.     Heart sounds: No murmur heard. Pulmonary:     Effort: Pulmonary effort is normal.     Breath sounds: Normal breath sounds.  Abdominal:     General: Bowel sounds are normal. There is no distension.     Palpations: Abdomen is soft. There is no mass.     Tenderness: There is no abdominal tenderness. There is no right CVA tenderness, left CVA tenderness, guarding or rebound.  Skin:    General: Skin is warm and dry.     Coloration: Skin is not jaundiced or pale.     Findings: No rash.  Neurological:     Mental Status: He is alert and oriented to person, place, and time. Mental status is at baseline.  Psychiatric:        Mood and Affect: Mood normal.        Behavior: Behavior normal.        Thought Content: Thought content normal.        Judgment: Judgment normal.     No results found. No results found. No results found for  this or any previous visit (from the past 24 hours).  Assessment/Plan: Bruce Harris is a 53 y.o. male present for OV for  Chronic kidney disease, stage 4 (severe) (HCC) He is established with nephrology  Hematuria,  unspecified type (Primary)/urinary frequency Exam today is normal.  He is afebrile.  Painless hematuria was resolved within 24 hours.  Unfortunately patient was unable to urinate today to leave a sample.  He had urinated right before he came to the office. Patient is asymptomatic and exam normal, since he cannot urinate we elected to allow him to drop off a specimen on Monday morning, and he is agreeable to do so.   Emergent precautions discussed-if symptoms return, pain, febrile or unable to urinate he is to be seen emergently. He does complain of requiring to get up in the middle of the night frequently to urinate-he does have a urologist, but has not seen them in a a little over a year encouraged him to call [facility] to schedule follow-up appointment for himself. - Urinalysis w microscopic + reflex cultur; Future   Reviewed expectations re: course of current medical issues. Discussed self-management of symptoms. Outlined signs and symptoms indicating need for more acute intervention. Patient verbalized understanding and all questions were answered. Patient received an After-Visit Summary.    Orders Placed This Encounter  Procedures   Urinalysis w microscopic + reflex cultur   No orders of the defined types were placed in this encounter.  Referral Orders  No referral(s) requested today     Note is dictated utilizing voice recognition software. Although note has been proof read prior to signing, occasional typographical errors still can be missed. If any questions arise, please do not hesitate to call for verification.   electronically signed by:  Charlies Bellini, DO  [facility] - [place]

## 2024-01-09 NOTE — Patient Instructions (Addendum)

## 2024-01-09 NOTE — Telephone Encounter (Signed)
 FYI Only or Action Required?: FYI only for provider: appointment scheduled on 01/09/24.  Patient was last seen in primary care on 12/22/2023 by Frann Mabel Mt, DO.  Called Nurse Triage reporting Hematuria.  Symptoms began several days ago.  Interventions attempted: Nothing.  Symptoms are: completely resolved.  Triage Disposition: See Physician Within 24 Hours  Patient/caregiver understands and will follow disposition?: Yes  Copied from CRM [id number]. Topic: Clinical - Red Word Triage >> Jan 09, 2024 10:20 AM Rea BROCKS wrote: Red Word that prompted transfer to Nurse Triage: Blood in urine over the weekend and then it just stopped.   Patient is off on Tuesdays for appts. Reason for Disposition  Blood in urine  (Exception: Could be normal menstrual bleeding.)  Answer Assessment - Initial Assessment Questions Pt with 1 day episode of passing blood with clots in urine on Saturday. Denies pain fever or other symptoms. No recent falls or injuries. Symptoms completely resolved by Sunday. No availability with any providers at home office today, scheduled appt for today at [facility]. Advised UC or ED for worsening symptoms.  1. COLOR of URINE: Describe the color of the urine.  (e.g., tea-colored, pink, red, bloody) Do you have blood clots in your urine? (e.g., none, pea, grape, small coin)     Started out with single large blood clot, after that with a few strands of blood  2. ONSET: When did the bleeding start?      Saturday, by Sunday morning symptoms were completely gone  3. EPISODES: How many times has there been blood in the urine? or How many times today?     2x with episode of blood clots, rest of day with passing of thin blood  4. PAIN with URINATION: Is there any pain with passing your urine? If Yes, ask: How bad is the pain?  (Scale 1-10; or mild, moderate, severe)     Denies  5. FEVER: Do you have a fever? If Yes, ask: What is your temperature, how was  it measured, and when did it start?     Denies  6. ASSOCIATED SYMPTOMS: Are you passing urine more frequently than usual?     Had a lot of frequency and urgency on Saturday  7. OTHER SYMPTOMS: Do you have any other symptoms? (e.g., back/flank pain, abdomen pain, vomiting)     Denies  Protocols used: Urine - Blood In-A-AH

## 2024-01-12 ENCOUNTER — Other Ambulatory Visit

## 2024-01-12 DIAGNOSIS — R319 Hematuria, unspecified: Secondary | ICD-10-CM

## 2024-01-12 DIAGNOSIS — R350 Frequency of micturition: Secondary | ICD-10-CM

## 2024-01-13 ENCOUNTER — Ambulatory Visit: Payer: Self-pay | Admitting: Family Medicine

## 2024-01-13 LAB — URINALYSIS W MICROSCOPIC + REFLEX CULTURE
Bacteria, UA: NONE SEEN /HPF
Bilirubin Urine: NEGATIVE
Glucose, UA: NEGATIVE
Hyaline Cast: NONE SEEN /LPF
Ketones, ur: NEGATIVE
Leukocyte Esterase: NEGATIVE
Nitrites, Initial: NEGATIVE
RBC / HPF: NONE SEEN /HPF (ref 0–2)
Specific Gravity, Urine: 1.013 (ref 1.001–1.035)
Squamous Epithelial / HPF: NONE SEEN /HPF (ref <=5)
WBC, UA: NONE SEEN /HPF (ref 0–5)
pH: 6.5 (ref 5.0–8.0)

## 2024-01-13 LAB — NO CULTURE INDICATED

## 2024-01-16 ENCOUNTER — Other Ambulatory Visit (HOSPITAL_BASED_OUTPATIENT_CLINIC_OR_DEPARTMENT_OTHER): Payer: Self-pay

## 2024-01-16 ENCOUNTER — Other Ambulatory Visit (HOSPITAL_COMMUNITY): Payer: Self-pay

## 2024-01-19 ENCOUNTER — Other Ambulatory Visit (HOSPITAL_COMMUNITY): Payer: Self-pay

## 2024-01-22 ENCOUNTER — Telehealth: Payer: Self-pay

## 2024-01-22 NOTE — Transitions of Care (Post Inpatient/ED Visit) (Signed)
 01/22/2024  Name: Bruce Harris MRN: [id number] DOB: Nov 21, 1970  Today's TOC FU Call Status: Today's TOC FU Call Status:: Successful TOC FU Call Completed TOC FU Call Complete Date: 01/22/24  Patient's Name and Date of Birth confirmed. Name, DOB  Transition Care Management Follow-up Telephone Call Date of Discharge: 01/21/24 Discharge Facility: Other [facility]) Name of Other (Non[facility]) Discharge Facility: [facility] Type of Discharge: Inpatient Admission Primary Inpatient Discharge Diagnosis:: Acute Renal Failure How have you been since you were released from the hospital?: Better Any questions or concerns?: No  Items Reviewed: Did you receive and understand the discharge instructions provided?: Yes Medications obtained,verified, and reconciled?: Yes (Medications Reviewed) Any new allergies since your discharge?: No Dietary orders reviewed?: NA Do you have support at home?: Yes People in Home [RPT]: significant other Name of Support/Comfort Primary Source: Lucretia Long  Medications Reviewed Today: Medications Reviewed Today     Reviewed by Moises Reusing, RN (Case Manager) on 01/22/24 at 1209  Med List Status: <None>   Medication Order Taking? Sig Documenting Provider Last Dose Status Informant  amLODipine -valsartan  (EXFORGE ) 10-320 MG tablet [id number]  Take 1 tablet by mouth daily. Frann Mabel Mt, DO  Active   aspirin 81 MG chewable tablet [id number] Yes Chew 81 mg by mouth daily. [provider]  Active   atorvastatin (LIPITOR) 40 MG tablet [id number] Yes Take 40 mg by mouth daily. [provider]  Active   azelastine  (ASTELIN ) 0.1 % nasal spray [id number]  Place 2 sprays into both nostrils 2 (two) times daily. Kennyth Worth HERO, MD  Active Self  carvedilol  (COREG ) 25 MG tablet [id number]  Take 1 tablet (25 mg total) by mouth 2 (two) times daily with a meal. Frann Mabel Mt, DO  Active   EPINEPHrine  (EPIPEN  2-PAK) 0.3 mg/0.3 mL IJ  SOAJ injection [id number]  Inject 0.3 mLs (0.3 mg total) into the muscle as needed for anaphylaxis. Frann Mabel Mt, DO  Active Self  hydrALAZINE  (APRESOLINE ) 50 MG tablet [id number]  Take 1 tablet (50 mg total) by mouth 3 (three) times daily. Kenard Zachary PARAS, MD  Active   hydrochlorothiazide  (HYDRODIURIL ) 25 MG tablet [id number]  Take 1 tablet (25 mg total) by mouth daily.  Patient not taking: Reported on 01/22/2024   Frann Mabel Mt, DO  Active   rosuvastatin  (CRESTOR ) 20 MG tablet [id number]  Take 1 tablet (20 mg total) by mouth daily.  Patient not taking: Reported on 01/22/2024   Frann Mabel Mt, DO  Active   spironolactone  (ALDACTONE ) 25 MG tablet [id number]  Take 1 tablet (25 mg total) by mouth daily.  Patient not taking: Reported on 01/22/2024   Frann Mabel Mt, DO  Active             Home Care and Equipment/Supplies: Were Home Health Services Ordered?: NA Any new equipment or medical supplies ordered?: NA  Functional Questionnaire: Do you need assistance with bathing/showering or dressing?: No Do you need assistance with meal preparation?: No Do you need assistance with eating?: No Do you have difficulty maintaining continence: No Do you need assistance with getting out of bed/getting out of a chair/moving?: No Do you have difficulty managing or taking your medications?: No  Follow up appointments reviewed: PCP Follow-up appointment confirmed?: No (The patient states he has an appointment with his kidney provider and he will call PCP and make an appointment after that) MD Provider Line Number:[phone] Given: No Specialist Hospital Follow-up appointment confirmed?: Yes Date of Specialist follow-up appointment?: 01/23/24 Follow-Up Specialty  Provider:: Kidney Specialist Do you need transportation to your follow-up appointment?: No Do you understand care options if your condition(s) worsen?: Yes-patient verbalized understanding  SDOH  Interventions Today    Flowsheet Row Most Recent Value  SDOH Interventions   Food Insecurity Interventions Intervention Not Indicated  Housing Interventions Intervention Not Indicated  Transportation Interventions Intervention Not Indicated  Utilities Interventions Intervention Not Indicated    Medford Balboa, BSN, RN [place]  VBCI - Population Health RN Care Manager [phone]

## 2024-01-23 ENCOUNTER — Other Ambulatory Visit (HOSPITAL_COMMUNITY): Payer: Self-pay

## 2024-01-23 MED ORDER — CLONIDINE HCL 0.1 MG PO TABS
0.1000 mg | ORAL_TABLET | Freq: Two times a day (BID) | ORAL | 2 refills | Status: DC
  Filled 2024-01-23: qty 60, 30d supply, fill #0

## 2024-01-26 ENCOUNTER — Other Ambulatory Visit (HOSPITAL_COMMUNITY): Payer: Self-pay

## 2024-01-26 MED ORDER — ONDANSETRON 8 MG PO TBDP
8.0000 mg | ORAL_TABLET | Freq: Three times a day (TID) | ORAL | 0 refills | Status: AC | PRN
  Filled 2024-01-26: qty 30, 10d supply, fill #0

## 2024-02-04 ENCOUNTER — Other Ambulatory Visit (HOSPITAL_BASED_OUTPATIENT_CLINIC_OR_DEPARTMENT_OTHER): Payer: Self-pay

## 2024-02-05 ENCOUNTER — Other Ambulatory Visit (HOSPITAL_COMMUNITY): Payer: Self-pay

## 2024-02-05 ENCOUNTER — Other Ambulatory Visit (HOSPITAL_BASED_OUTPATIENT_CLINIC_OR_DEPARTMENT_OTHER): Payer: Self-pay

## 2024-02-06 ENCOUNTER — Other Ambulatory Visit: Payer: Self-pay

## 2024-02-06 ENCOUNTER — Other Ambulatory Visit (HOSPITAL_COMMUNITY): Payer: Self-pay

## 2024-02-06 MED ORDER — CLONIDINE HCL 0.2 MG PO TABS
0.2000 mg | ORAL_TABLET | Freq: Two times a day (BID) | ORAL | 2 refills | Status: DC
  Filled 2024-02-06: qty 60, 30d supply, fill #0

## 2024-02-06 MED ORDER — HYDRALAZINE HCL 100 MG PO TABS
100.0000 mg | ORAL_TABLET | Freq: Three times a day (TID) | ORAL | 2 refills | Status: AC
  Filled 2024-02-06: qty 60, 20d supply, fill #0

## 2024-02-06 MED ORDER — AMLODIPINE BESYLATE 10 MG PO TABS
10.0000 mg | ORAL_TABLET | Freq: Every day | ORAL | 2 refills | Status: AC
  Filled 2024-02-06 – 2024-03-31 (×2): qty 30, 30d supply, fill #0

## 2024-02-06 MED ORDER — CALCITRIOL 0.25 MCG PO CAPS
0.2500 ug | ORAL_CAPSULE | ORAL | 2 refills | Status: DC
  Filled 2024-02-06: qty 12, 28d supply, fill #0

## 2024-02-17 ENCOUNTER — Other Ambulatory Visit (HOSPITAL_COMMUNITY): Payer: Self-pay

## 2024-03-23 ENCOUNTER — Other Ambulatory Visit (HOSPITAL_COMMUNITY): Payer: Self-pay

## 2024-03-23 ENCOUNTER — Emergency Department (HOSPITAL_COMMUNITY)

## 2024-03-23 ENCOUNTER — Other Ambulatory Visit: Payer: Self-pay

## 2024-03-23 ENCOUNTER — Inpatient Hospital Stay (HOSPITAL_COMMUNITY)
Admission: EM | Admit: 2024-03-23 | Discharge: 2024-03-26 | DRG: 305 | Disposition: A | Discharge status: Home / Self Care

## 2024-03-23 DIAGNOSIS — I161 Hypertensive emergency: Principal | ICD-10-CM | POA: Diagnosis present

## 2024-03-23 DIAGNOSIS — Z888 Allergy status to other drugs, medicaments and biological substances status: Secondary | ICD-10-CM | POA: Diagnosis not present

## 2024-03-23 DIAGNOSIS — Z8249 Family history of ischemic heart disease and other diseases of the circulatory system: Secondary | ICD-10-CM

## 2024-03-23 DIAGNOSIS — T465X5A Adverse effect of other antihypertensive drugs, initial encounter: Secondary | ICD-10-CM | POA: Diagnosis not present

## 2024-03-23 DIAGNOSIS — R200 Anesthesia of skin: Secondary | ICD-10-CM | POA: Diagnosis present

## 2024-03-23 DIAGNOSIS — R609 Edema, unspecified: Secondary | ICD-10-CM | POA: Diagnosis not present

## 2024-03-23 DIAGNOSIS — N184 Chronic kidney disease, stage 4 (severe): Secondary | ICD-10-CM | POA: Diagnosis present

## 2024-03-23 DIAGNOSIS — Z833 Family history of diabetes mellitus: Secondary | ICD-10-CM

## 2024-03-23 DIAGNOSIS — E785 Hyperlipidemia, unspecified: Secondary | ICD-10-CM | POA: Diagnosis present

## 2024-03-23 DIAGNOSIS — Z79899 Other long term (current) drug therapy: Secondary | ICD-10-CM | POA: Diagnosis not present

## 2024-03-23 DIAGNOSIS — Z91030 Bee allergy status: Secondary | ICD-10-CM

## 2024-03-23 DIAGNOSIS — I129 Hypertensive chronic kidney disease with stage 1 through stage 4 chronic kidney disease, or unspecified chronic kidney disease: Secondary | ICD-10-CM | POA: Diagnosis present

## 2024-03-23 DIAGNOSIS — I1311 Hypertensive heart and chronic kidney disease without heart failure, with stage 5 chronic kidney disease, or end stage renal disease: Secondary | ICD-10-CM | POA: Diagnosis not present

## 2024-03-23 DIAGNOSIS — Y92239 Unspecified place in hospital as the place of occurrence of the external cause: Secondary | ICD-10-CM | POA: Diagnosis not present

## 2024-03-23 DIAGNOSIS — R1310 Dysphagia, unspecified: Secondary | ICD-10-CM | POA: Diagnosis not present

## 2024-03-23 DIAGNOSIS — G4733 Obstructive sleep apnea (adult) (pediatric): Secondary | ICD-10-CM | POA: Diagnosis present

## 2024-03-23 DIAGNOSIS — D649 Anemia, unspecified: Secondary | ICD-10-CM | POA: Diagnosis not present

## 2024-03-23 DIAGNOSIS — N185 Chronic kidney disease, stage 5: Secondary | ICD-10-CM

## 2024-03-23 DIAGNOSIS — I429 Cardiomyopathy, unspecified: Secondary | ICD-10-CM | POA: Diagnosis present

## 2024-03-23 DIAGNOSIS — E876 Hypokalemia: Secondary | ICD-10-CM | POA: Diagnosis present

## 2024-03-23 DIAGNOSIS — M109 Gout, unspecified: Secondary | ICD-10-CM | POA: Diagnosis present

## 2024-03-23 DIAGNOSIS — Y929 Unspecified place or not applicable: Secondary | ICD-10-CM | POA: Diagnosis not present

## 2024-03-23 DIAGNOSIS — R110 Nausea: Secondary | ICD-10-CM | POA: Diagnosis present

## 2024-03-23 DIAGNOSIS — Z7982 Long term (current) use of aspirin: Secondary | ICD-10-CM

## 2024-03-23 DIAGNOSIS — X31XXXA Exposure to excessive natural cold, initial encounter: Secondary | ICD-10-CM

## 2024-03-23 DIAGNOSIS — D631 Anemia in chronic kidney disease: Secondary | ICD-10-CM | POA: Diagnosis present

## 2024-03-23 LAB — PROTIME-INR
INR: 1 (ref 0.8–1.2)
Prothrombin Time: 13.3 s (ref 11.4–15.2)

## 2024-03-23 LAB — MRSA NEXT GEN BY PCR, NASAL: MRSA by PCR Next Gen: NOT DETECTED

## 2024-03-23 LAB — COMPREHENSIVE METABOLIC PANEL WITH GFR
ALT: 10 U/L (ref 0–44)
AST: 25 U/L (ref 15–41)
Albumin: 4.4 g/dL (ref 3.5–5.0)
Alkaline Phosphatase: 84 U/L (ref 38–126)
Anion gap: 12 (ref 5–15)
BUN: 30 mg/dL — ABNORMAL HIGH (ref 6–20)
CO2: 24 mmol/L (ref 22–32)
Calcium: 9.7 mg/dL (ref 8.9–10.3)
Chloride: 106 mmol/L (ref 98–111)
Creatinine, Ser: 4.62 mg/dL — ABNORMAL HIGH (ref 0.61–1.24)
GFR, Estimated: 14 mL/min — ABNORMAL LOW
Glucose, Bld: 112 mg/dL — ABNORMAL HIGH (ref 70–99)
Potassium: 3.7 mmol/L (ref 3.5–5.1)
Sodium: 142 mmol/L (ref 135–145)
Total Bilirubin: 0.6 mg/dL (ref 0.0–1.2)
Total Protein: 8.5 g/dL — ABNORMAL HIGH (ref 6.5–8.1)

## 2024-03-23 LAB — DIFFERENTIAL
Abs Immature Granulocytes: 0.02 K/uL (ref 0.00–0.07)
Basophils Absolute: 0 K/uL (ref 0.0–0.1)
Basophils Relative: 0 %
Eosinophils Absolute: 0.2 K/uL (ref 0.0–0.5)
Eosinophils Relative: 2 %
Immature Granulocytes: 0 %
Lymphocytes Relative: 15 %
Lymphs Abs: 1.1 K/uL (ref 0.7–4.0)
Monocytes Absolute: 0.3 K/uL (ref 0.1–1.0)
Monocytes Relative: 4 %
Neutro Abs: 5.7 K/uL (ref 1.7–7.7)
Neutrophils Relative %: 79 %

## 2024-03-23 LAB — CBC
HCT: 38.8 % — ABNORMAL LOW (ref 39.0–52.0)
Hemoglobin: 12.2 g/dL — ABNORMAL LOW (ref 13.0–17.0)
MCH: 25.7 pg — ABNORMAL LOW (ref 26.0–34.0)
MCHC: 31.4 g/dL (ref 30.0–36.0)
MCV: 81.7 fL (ref 80.0–100.0)
Platelets: 232 K/uL (ref 150–400)
RBC: 4.75 MIL/uL (ref 4.22–5.81)
RDW: 15.4 % (ref 11.5–15.5)
WBC: 7.4 K/uL (ref 4.0–10.5)
nRBC: 0 % (ref 0.0–0.2)

## 2024-03-23 LAB — URINE DRUG SCREEN
Amphetamines: NEGATIVE
Barbiturates: NEGATIVE
Benzodiazepines: NEGATIVE
Cocaine: NEGATIVE
Fentanyl: NEGATIVE
Methadone Scn, Ur: NEGATIVE
Opiates: NEGATIVE
Tetrahydrocannabinol: NEGATIVE

## 2024-03-23 LAB — ETHANOL: Alcohol, Ethyl (B): <15 mg/dL

## 2024-03-23 LAB — CBG MONITORING, ED: Glucose-Capillary: 102 mg/dL — ABNORMAL HIGH (ref 70–99)

## 2024-03-23 LAB — APTT: aPTT: 33 s (ref 24–36)

## 2024-03-23 MED ORDER — NICARDIPINE HCL IN NACL 40-0.83 MG/200ML-% IV SOLN
3.0000 mg/h | INTRAVENOUS | Status: DC
  Administered 2024-03-24: 7.5 mg/h via INTRAVENOUS
  Filled 2024-03-23 (×2): qty 200

## 2024-03-23 MED ORDER — SENNA 8.6 MG PO TABS: 1.0000 | ORAL_TABLET | Freq: Two times a day (BID) | ORAL | Status: DC | PRN

## 2024-03-23 MED ORDER — CHLORHEXIDINE GLUCONATE CLOTH 2 % EX PADS
6.0000 | MEDICATED_PAD | Freq: Every day | CUTANEOUS | Status: DC
  Administered 2024-03-24 – 2024-03-25 (×2): 6 via TOPICAL

## 2024-03-23 MED ORDER — HEPARIN SODIUM (PORCINE) 5000 UNIT/ML IJ SOLN
5000.0000 [IU] | Freq: Three times a day (TID) | INTRAMUSCULAR | Status: DC
  Administered 2024-03-23 – 2024-03-26 (×8): 5000 [IU] via SUBCUTANEOUS
  Filled 2024-03-23 (×8): qty 1

## 2024-03-23 MED ORDER — ORAL CARE MOUTH RINSE: 15.0000 mL | OROMUCOSAL | Status: DC | PRN

## 2024-03-23 MED ORDER — NICARDIPINE HCL IN NACL 20-0.86 MG/200ML-% IV SOLN
3.0000 mg/h | INTRAVENOUS | Status: DC
  Administered 2024-03-23: 7.5 mg/h via INTRAVENOUS
  Administered 2024-03-23 (×2): 5 mg/h via INTRAVENOUS
  Filled 2024-03-23 (×2): qty 200

## 2024-03-23 MED ORDER — LABETALOL HCL 5 MG/ML IV SOLN
20.0000 mg | INTRAVENOUS | Status: AC | PRN
  Administered 2024-03-23 (×3): 20 mg via INTRAVENOUS
  Filled 2024-03-23 (×3): qty 4

## 2024-03-23 NOTE — ED Provider Notes (Signed)
 " [place] EMERGENCY DEPARTMENT AT [facility] Provider Note   CSN: [id number] Arrival date & time: 03/23/24  1120     Patient presents with: Hypertension and Headache   Bruce Harris is a 54 y.o. male.   HPI Patient presents with a coworker/supervisor who assists with the history. He presents with concern for right hand numbness, nausea, headache.  Numbness has resolved, headache is essentially resolved.  History is notable for hypertension, CKD, admission to outside hospital 2 months ago for somewhat similar presentation, though with more severity, reportedly. Patient states that he has been good about taking his medication as directed, no obvious precipitant.  Today he was at work, exposed to cold temperatures, had symptoms and was brought here for evaluation.    Prior to Admission medications  Medication Sig Start Date End Date Taking? Authorizing Provider  aspirin  81 MG chewable tablet Chew 81 mg by mouth daily.   Yes [provider]  azelastine  (ASTELIN ) 0.1 % nasal spray Place 2 sprays into both nostrils 2 (two) times daily. Patient taking differently: Place 2 sprays into both nostrils daily as needed for rhinitis or allergies. 10/16/19  Yes Kennyth Worth HERO, MD  amLODipine  (NORVASC ) 10 MG tablet Take 1 tablet (10 mg total) by mouth daily. 02/06/24     amLODipine -valsartan  (EXFORGE ) 10-320 MG tablet Take 1 tablet by mouth daily. 12/22/23   Frann Mabel Mt, DO  atorvastatin  (LIPITOR) 40 MG tablet Take 40 mg by mouth daily.    [provider]  calcitRIOL  (ROCALTROL ) 0.25 MCG capsule Take 1 capsule (0.25 mcg total) by mouth 3 (three) times a week on Mon, Wed and Fri 02/06/24     carvedilol  (COREG ) 25 MG tablet Take 1 tablet (25 mg total) by mouth 2 (two) times daily with a meal. 12/22/23   Wendling, Mabel Mt, DO  cloNIDine  (CATAPRES ) 0.1 MG tablet Take 1 tablet (0.1 mg total) by mouth 2 (two) times daily. 01/23/24     cloNIDine  (CATAPRES )  0.2 MG tablet Take 1 tablet (0.2 mg total) by mouth 2 (two) times daily. 02/06/24     EPINEPHrine  (EPIPEN  2-PAK) 0.3 mg/0.3 mL IJ SOAJ injection Inject 0.3 mLs (0.3 mg total) into the muscle as needed for anaphylaxis. 09/01/19   Frann Mabel Mt, DO  hydrALAZINE  (APRESOLINE ) 100 MG tablet Take 1 tablet (100 mg total) by mouth 3 (three) times daily. 02/06/24     hydrALAZINE  (APRESOLINE ) 50 MG tablet Take 1 tablet (50 mg total) by mouth 3 (three) times daily. 08/03/22   Shalhoub, Zachary PARAS, MD  hydrochlorothiazide  (HYDRODIURIL ) 25 MG tablet Take 1 tablet (25 mg total) by mouth daily. 12/22/23   Frann Mabel Mt, DO  ondansetron  (ZOFRAN -ODT) 8 MG disintegrating tablet Place 1 tablet (8 mg total) onto the tongue every 8 (eight) hours as needed for nausea and vomiting. 01/26/24     rosuvastatin  (CRESTOR ) 20 MG tablet Take 1 tablet (20 mg total) by mouth daily. 12/22/23   Frann Mabel Mt, DO  spironolactone  (ALDACTONE ) 25 MG tablet Take 1 tablet (25 mg total) by mouth daily. 12/22/23   Frann Mabel Mt, DO    Allergies: Bee venom    Review of Systems  Updated Vital Signs BP (!) 203/140   Pulse 78   Temp 98.5 F (36.9 C) (Oral)   Resp 15   SpO2 99%   Physical Exam Vitals and nursing note reviewed.  Constitutional:      General: He is not in acute distress.  Appearance: He is well-developed.  HENT:     Head: Normocephalic and atraumatic.  Eyes:     Conjunctiva/sclera: Conjunctivae normal.  Cardiovascular:     Rate and Rhythm: Normal rate and regular rhythm.  Pulmonary:     Effort: Pulmonary effort is normal. No respiratory distress.     Breath sounds: No stridor.  Abdominal:     General: There is no distension.  Skin:    General: Skin is warm and dry.  Neurological:     Mental Status: He is alert and oriented to person, place, and time.     Cranial Nerves: No cranial nerve deficit or dysarthria.     Motor: No weakness.     Coordination: Coordination  normal.  Psychiatric:        Mood and Affect: Mood is not anxious.        Speech: Speech normal.     (all labs ordered are listed, but only abnormal results are displayed) Labs Reviewed  CBC - Abnormal; Notable for the following components:      Result Value   Hemoglobin 12.2 (*)    HCT 38.8 (*)    MCH 25.7 (*)    All other components within normal limits  COMPREHENSIVE METABOLIC PANEL WITH GFR - Abnormal; Notable for the following components:   Glucose, Bld 112 (*)    BUN 30 (*)    Creatinine, Ser 4.62 (*)    Total Protein 8.5 (*)    GFR, Estimated 14 (*)    All other components within normal limits  CBG MONITORING, ED - Abnormal; Notable for the following components:   Glucose-Capillary 102 (*)    All other components within normal limits  PROTIME-INR  APTT  ETHANOL  URINE DRUG SCREEN  DIFFERENTIAL    EKG: EKG Interpretation Date/Time:  Tuesday March 23 2024 12:06:03 EST Ventricular Rate:  82 PR Interval:  150 QRS Duration:  108 QT Interval:  426 QTC Calculation: 498 R Axis:   -34  Text Interpretation: Sinus rhythm Abnormal R-wave progression, early transition LVH with secondary repolarization abnormality Borderline prolonged QT interval Confirmed by Garrick Charleston [phone]) on 03/23/2024 12:35:38 PM  Radiology: CT HEAD WO CONTRAST Result Date: 03/23/2024 EXAM: CT HEAD WITHOUT CONTRAST 03/23/2024 12:16:05 PM TECHNIQUE: CT of the head was performed without the administration of intravenous contrast. Automated exposure control, iterative reconstruction, and/or weight based adjustment of the mA/kV was utilized to reduce the radiation dose to as low as reasonably achievable. COMPARISON: 07/31/2022 CLINICAL HISTORY: Neuro deficit, acute, stroke suspected. FINDINGS: BRAIN AND VENTRICLES: No acute hemorrhage. 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 There is low attenuation in the left thalamus which is likely  artifactual. Mild chronic microvascular ischemic changes. A few areas of streak artifact slightly limited evaluation. No hydrocephalus. No extra-axial collection. No mass effect or midline shift. ORBITS: Chronic deformity of left lamina papyracea. SINUSES: No acute abnormality. SOFT TISSUES AND SKULL: No acute soft tissue abnormality. No skull fracture. IMPRESSION: 1. No acute findings. 2. 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 Electronically signed by: Donnice Mania MD 03/23/2024 12:49 PM EST RP Workstation: [id number]     Procedures   Medications Ordered in the ED  nicardipine  (CARDENE ) 20mg  in 0.86% saline 200ml IV infusion (0.1 mg/ml) (has no administration in time range)  labetalol  (NORMODYNE ) injection 20 mg (20 mg Intravenous Given 03/23/24 1606)  Medical Decision Making Adult male presents with notably hypertensive with numbness in his hand, headache.  Concern for hypertensive emergency versus CVA versus worsening CKD.  Reassuring initial neuroexam noted. Cardiac 75 sinus normal pulse ox 100% room air normal.  Amount and/or Complexity of Data Reviewed Independent Historian:     Details: Coworker External Data Reviewed: notes.    Details: Notes from [facility] discharge November last year below Labs: ordered. Decision-making details documented in ED Course. Radiology: ordered and independent interpretation performed. Decision-making details documented in ED Course. ECG/medicine tests: ordered and independent interpretation performed. Decision-making details documented in ED Course.  Risk Prescription drug management. Decision regarding hospitalization. Diagnosis or treatment significantly limited by social determinants of health.    01/2024 DISCHARGE DIAGNOSIS: Principal Problem:   Acute renal failure superimposed on stage 4 chronic kidney disease, unspecified  acute renal failure type Active Problems:   Hypertensive urgency   Microcytic anemia   Dizziness   Prediabetes Resolved Problems:   Dizziness   Stroke-like symptoms   Elevated troponin       Hospital Course: 54 year old male with a history of hypertension, cardiomyopathy, and CKD stage IV who presented on 11/16 due to a headache, dizziness, nausea, and vomiting for 4 days.  CT head showed a small right frontal hypoattenuation.  CTA head and neck with age-indeterminate lacunar infarct.  Neurology was consulted and he underwent an MRI brain that showed chronic versus subacute infarcts and chronic microhemorrhages compatible with hypertensive microangiopathy.  TTE with EF 60%, severe LVH.  Likely due to accelerated hypertension given his lack of history of migraines. Started on aspirin , statin.   For his hypertensive urgency, holding hydrochlorothiazide  and valsartan  due to AKI. Continue amlodipine , coreg , and increased dose of hydralazine .  Okay to restart valsartan  at discharge given his follow up with nephrology on 11/21.      Patient also found to have AKI on CKD stage IV as below.    4:41 PM Blood transiently 15% below arrival and ECG reviewed, labs reviewed intracranial abnormality, and absent focal neurodeficits, no evidence for stroke. Patient does have CKD which he states his outpatient labs have had creatinine of 5 most recently, today's value slightly improved, though worse than our most recent charted value here.  Patient has received 3 doses of beta-blocker, now with blood pressure now close to that on arrival, patient will require initiation of nicardipine , due to hypertensive emergency.   Final diagnoses:  Hypertensive emergency   CRITICAL CARE Performed by: Lamar Salen Total critical care time: 35 minutes Critical care time was exclusive of separately billable procedures and treating other patients. Critical care was necessary to treat or prevent imminent or  life-threatening deterioration. Critical care was time spent personally by me on the following activities: development of treatment plan with patient and/or surrogate as well as nursing, discussions with consultants, evaluation of patient's response to treatment, examination of patient, obtaining history from patient or surrogate, ordering and performing treatments and interventions, ordering and review of laboratory studies, ordering and review of radiographic studies, pulse oximetry and re-evaluation of patient's condition.    Salen Lamar, MD 03/23/24 1643  "

## 2024-03-23 NOTE — Progress Notes (Signed)
 eLink Physician-Brief Progress Note Patient Name: Bruce Harris DOB: 11-13-1970 MRN: [id number]   Date of Service  03/23/2024  HPI/Events of Note  Patient admitted with hypertensive emergency along with metabolic encephalopathy.  eICU Interventions  New patient Evaluation. Cardene  gtt.        Faren Florence U Kajuan Guyton 03/23/2024, 10:11 PM

## 2024-03-23 NOTE — ED Provider Notes (Signed)
 Seen by Dr Garrick.   Consult with PCCM service pending.  Case discussed with Dr Adrien Winfred Randol Thom, MD 03/23/24 1740

## 2024-03-23 NOTE — H&P (Addendum)
 "  NAME:  Bruce Harris, MRN:  [id number], DOB:  Sep 03, 1970, LOS: 0 ADMISSION DATE:  03/23/2024, CONSULTATION DATE:  03/23/24 REFERRING MD:  Dr. Randol, CHIEF COMPLAINT:  HA/ HTN   History of Present Illness:   48 yoM with PMH of HTN, CKD IV, cardiomyopathy (EF 65-70% 01/2024), gout, HLD, anemia of CKD presenting to [facility] ER with complaints of right hand numbness, nausea, and headache after running out of his hypertension medications over the weekend.  Picked up his medications this morning and took them but given headache, presented to ER.  Denies tobacco or illicit drug abuse.  Denies any chest pain, back pain, abd pain, SOB, vision changes or focal neurological deficits.   In ER, SBP initially 210, HR 80, afebrile, normoxic.  Labs significant for BUN/ sCr 30/ 4.62 (previous sCr 5.5 in 01/2024), Hgb 12.2.  CTH neg.  UDS neg.  Labetalol  prn given then started on cardene  gtt.  Currently reports mild headache otherwise asymptomatic.  PCCM called for admit.   Pertinent  Medical History   Past Medical History:  Diagnosis Date   Cardiomyopathy    Chicken pox    Gout    Hyperglycemia    Hyperlipidemia    Hypertension    Hypertensive urgency 07/31/2022   Hypogonadism in male    OSA (obstructive sleep apnea) 05/19/2017   Toxic effect of venom of bees    Significant Hospital Events: Including procedures, antibiotic start and stop dates in addition to other pertinent events   1/20  Interim History / Subjective:   Objective    Blood pressure (!) 174/115, pulse 83, temperature 98.5 F (36.9 C), temperature source Oral, resp. rate 15, SpO2 97%.       No intake or output data in the 24 hours ending 03/23/24 1744 There were no vitals filed for this visit.  Examination: General:  pleasant adult male sitting in bed in NAD HEENT: MM pink/moist Neuro: Alert, oriented x 4, MAE, no focal deficits, denies numbness CV: rr, NSR PULM:  non labored, clear GI: soft, bs+, NT Extremities: warm/dry,  no LE edema  Skin: no obvious rashes, limited exam due to clothing   Resolved problem list   Assessment and Plan   HTN Emergency  - in setting of missing home meds for several days, ran out of meds.  Restarted 1/20 am per pt - awaiting home med rec view, pt not sure of home meds.    - UDS neg P:  - ICU admit/ tele monitoring  - cont cardene  gtt for SBP goal 160 which is 20% reduction - likely restart home meds 1/21 after med reconsolidation  - CTH neg, no focal deficits, CP/ back pain, lungs clear.  Nausea resolved - monitor neuro exams - tylenol  prn headache   CKD IV - due to hypertensive nephropathy, followed outpt by Dr. Dennise - previous sCr 01/21/24 sCr 5.5 and 10/08/23 of 3.36  P:  - repeat renal indices in am, no severe electrolyte derangements currently - monitor UOP - renal consult if worsening - renal diet   Anemia - stable H/H  HLD - restart statin after med rec  Labs   CBC: Recent Labs  Lab 03/23/24 1238  WBC 7.4  NEUTROABS 5.7  HGB 12.2*  HCT 38.8*  MCV 81.7  PLT 232    Basic Metabolic Panel: Recent Labs  Lab 03/23/24 1238  NA 142  K 3.7  CL 106  CO2 24  GLUCOSE 112*  BUN 30*  CREATININE 4.62*  CALCIUM  9.7   GFR: CrCl cannot be calculated (Unknown ideal weight.). Recent Labs  Lab 03/23/24 1238  WBC 7.4    Liver Function Tests: Recent Labs  Lab 03/23/24 1238  AST 25  ALT 10  ALKPHOS 84  BILITOT 0.6  PROT 8.5*  ALBUMIN 4.4   No results for input(s): LIPASE, AMYLASE in the last 168 hours. No results for input(s): AMMONIA in the last 168 hours.  ABG No results found for: PHART, PCO2ART, PO2ART, HCO3, TCO2, ACIDBASEDEF, O2SAT   Coagulation Profile: Recent Labs  Lab 03/23/24 1238  INR 1.0    Cardiac Enzymes: No results for input(s): CKTOTAL, CKMB, CKMBINDEX, TROPONINI in the last 168 hours.  HbA1C: Hgb A1c MFr Bld  Date/Time Value Ref Range Status  03/12/2022 10:01 AM 6.3 4.6 - 6.5 %  Final    Comment:    Glycemic Control Guidelines for People with Diabetes:Non Diabetic:  <6%Goal of Therapy: <7%Additional Action Suggested:  >8%   10/18/2016 10:10 AM 6.1 4.6 - 6.5 % Final    Comment:    Glycemic Control Guidelines for People with Diabetes:Non Diabetic:  <6%Goal of Therapy: <7%Additional Action Suggested:  >8%     CBG: Recent Labs  Lab 03/23/24 1237  GLUCAP 102*    Review of Systems:   Review of Systems  Constitutional:  Negative for chills and fever.  Respiratory:  Negative for cough, sputum production and shortness of breath.   Cardiovascular:  Negative for chest pain and leg swelling.  Gastrointestinal:  Positive for nausea. Negative for abdominal pain and vomiting.  Neurological:  Positive for headaches. Negative for dizziness, focal weakness, loss of consciousness and weakness.   Past Medical History:  He,  has a past medical history of Cardiomyopathy, Chicken pox, Gout, Hyperglycemia, Hyperlipidemia, Hypertension, Hypertensive urgency (07/31/2022), Hypogonadism in male, OSA (obstructive sleep apnea) (05/19/2017), and Toxic effect of venom of bees.   Surgical History:   Past Surgical History:  Procedure Laterality Date   HAND SURGERY     Right     Social History:   reports that he has never smoked. He has never used smokeless tobacco. He reports current alcohol use. He reports that he does not use drugs.   Family History:  His family history includes Coronary artery disease in his mother; Diabetes in his maternal grandmother and mother; Healthy in his brother, sister, and son; Heart disease in his father; Hyperlipidemia in his mother; Hypertension in his father and mother; Kidney failure in his maternal grandmother.   Allergies Allergies[1]   Home Medications  Prior to Admission medications  Medication Sig Start Date End Date Taking? Authorizing Provider  aspirin  81 MG chewable tablet Chew 81 mg by mouth daily.   Yes [provider]   azelastine  (ASTELIN ) 0.1 % nasal spray Place 2 sprays into both nostrils 2 (two) times daily. Patient taking differently: Place 2 sprays into both nostrils daily as needed for rhinitis or allergies. 10/16/19  Yes Kennyth Worth HERO, MD  amLODipine  (NORVASC ) 10 MG tablet Take 1 tablet (10 mg total) by mouth daily. 02/06/24     amLODipine -valsartan  (EXFORGE ) 10-320 MG tablet Take 1 tablet by mouth daily. 12/22/23   Frann Mabel Mt, DO  atorvastatin  (LIPITOR) 40 MG tablet Take 40 mg by mouth daily.    [provider]  calcitRIOL  (ROCALTROL ) 0.25 MCG capsule Take 1 capsule (0.25 mcg total) by mouth 3 (three) times a week on Mon, Wed and Fri 02/06/24     carvedilol  (COREG ) 25 MG tablet  Take 1 tablet (25 mg total) by mouth 2 (two) times daily with a meal. 12/22/23   Wendling, Mabel Mt, DO  cloNIDine  (CATAPRES ) 0.1 MG tablet Take 1 tablet (0.1 mg total) by mouth 2 (two) times daily. 01/23/24     cloNIDine  (CATAPRES ) 0.2 MG tablet Take 1 tablet (0.2 mg total) by mouth 2 (two) times daily. 02/06/24     EPINEPHrine  (EPIPEN  2-PAK) 0.3 mg/0.3 mL IJ SOAJ injection Inject 0.3 mLs (0.3 mg total) into the muscle as needed for anaphylaxis. 09/01/19   Frann Mabel Mt, DO  hydrALAZINE  (APRESOLINE ) 100 MG tablet Take 1 tablet (100 mg total) by mouth 3 (three) times daily. 02/06/24     hydrALAZINE  (APRESOLINE ) 50 MG tablet Take 1 tablet (50 mg total) by mouth 3 (three) times daily. 08/03/22   Shalhoub, Zachary PARAS, MD  hydrochlorothiazide  (HYDRODIURIL ) 25 MG tablet Take 1 tablet (25 mg total) by mouth daily. 12/22/23   Frann Mabel Mt, DO  ondansetron  (ZOFRAN -ODT) 8 MG disintegrating tablet Place 1 tablet (8 mg total) onto the tongue every 8 (eight) hours as needed for nausea and vomiting. 01/26/24     rosuvastatin  (CRESTOR ) 20 MG tablet Take 1 tablet (20 mg total) by mouth daily. 12/22/23   Frann Mabel Mt, DO  spironolactone  (ALDACTONE ) 25 MG tablet Take 1 tablet (25 mg total) by mouth  daily. 12/22/23   Frann Mabel Mt, DO         CRITICAL CARE Performed by: Lyle Pesa   Total critical care time: 48 minutes  Critical care time was exclusive of separately billable procedures and treating other patients.  Critical care was necessary to treat or prevent imminent or life-threatening deterioration.  Critical care was time spent personally by me on the following activities: development of treatment plan with patient and/or surrogate as well as nursing, discussions with consultants, evaluation of patient's response to treatment, examination of patient, obtaining history from patient or surrogate, ordering and performing treatments and interventions, ordering and review of laboratory studies, ordering and review of radiographic studies, pulse oximetry and re-evaluation of patient's condition.    Lyle Pesa, NP [facility] 03/23/2024, 6:38 PM  See Amion for pager If no response to pager , please call [phone] until 7pm After 7:00 pm call Elink  [phone]           [1]  Allergies Allergen Reactions   Bee Venom Anaphylaxis and Swelling   "

## 2024-03-23 NOTE — ED Triage Notes (Signed)
 BIBA from work for elevated BP and headache. Pt medications have been changed recently- missed last 2 days, but did take it this AM 200/120 bp 80 hr 130 cbg 98% r/a

## 2024-03-24 ENCOUNTER — Encounter (HOSPITAL_COMMUNITY): Payer: Self-pay

## 2024-03-24 DIAGNOSIS — I161 Hypertensive emergency: Secondary | ICD-10-CM | POA: Diagnosis not present

## 2024-03-24 DIAGNOSIS — N184 Chronic kidney disease, stage 4 (severe): Secondary | ICD-10-CM | POA: Diagnosis not present

## 2024-03-24 DIAGNOSIS — E785 Hyperlipidemia, unspecified: Secondary | ICD-10-CM | POA: Diagnosis not present

## 2024-03-24 DIAGNOSIS — E876 Hypokalemia: Secondary | ICD-10-CM

## 2024-03-24 DIAGNOSIS — D631 Anemia in chronic kidney disease: Secondary | ICD-10-CM

## 2024-03-24 LAB — BASIC METABOLIC PANEL WITH GFR
Anion gap: 12 (ref 5–15)
BUN: 34 mg/dL — ABNORMAL HIGH (ref 6–20)
CO2: 23 mmol/L (ref 22–32)
Calcium: 8.9 mg/dL (ref 8.9–10.3)
Chloride: 107 mmol/L (ref 98–111)
Creatinine, Ser: 4.6 mg/dL — ABNORMAL HIGH (ref 0.61–1.24)
GFR, Estimated: 14 mL/min — ABNORMAL LOW
Glucose, Bld: 125 mg/dL — ABNORMAL HIGH (ref 70–99)
Potassium: 3.2 mmol/L — ABNORMAL LOW (ref 3.5–5.1)
Sodium: 142 mmol/L (ref 135–145)

## 2024-03-24 LAB — HIV ANTIBODY (ROUTINE TESTING W REFLEX): HIV Screen 4th Generation wRfx: NONREACTIVE

## 2024-03-24 MED ORDER — CLONIDINE HCL 0.1 MG PO TABS
0.2000 mg | ORAL_TABLET | Freq: Two times a day (BID) | ORAL | Status: DC
  Administered 2024-03-24: 0.2 mg via ORAL
  Filled 2024-03-24: qty 2

## 2024-03-24 MED ORDER — HYDRALAZINE HCL 50 MG PO TABS
100.0000 mg | ORAL_TABLET | Freq: Three times a day (TID) | ORAL | Status: DC
  Administered 2024-03-24 – 2024-03-26 (×5): 100 mg via ORAL
  Filled 2024-03-24 (×5): qty 2

## 2024-03-24 MED ORDER — AMLODIPINE BESYLATE 10 MG PO TABS
10.0000 mg | ORAL_TABLET | Freq: Every day | ORAL | Status: DC
  Administered 2024-03-25 – 2024-03-26 (×2): 10 mg via ORAL
  Filled 2024-03-24 (×2): qty 1

## 2024-03-24 MED ORDER — CLONIDINE HCL 0.1 MG PO TABS
0.1000 mg | ORAL_TABLET | Freq: Once | ORAL | Status: AC
  Administered 2024-03-24: 0.1 mg via ORAL
  Filled 2024-03-24: qty 1

## 2024-03-24 MED ORDER — CLONIDINE HCL 0.1 MG PO TABS
0.3000 mg | ORAL_TABLET | Freq: Three times a day (TID) | ORAL | Status: DC
  Administered 2024-03-24: 0.3 mg via ORAL
  Filled 2024-03-24: qty 3

## 2024-03-24 MED ORDER — CARVEDILOL 25 MG PO TABS
25.0000 mg | ORAL_TABLET | Freq: Two times a day (BID) | ORAL | Status: DC
  Administered 2024-03-24 – 2024-03-26 (×4): 25 mg via ORAL
  Filled 2024-03-24 (×3): qty 2
  Filled 2024-03-24: qty 1

## 2024-03-24 MED ORDER — AMLODIPINE BESYLATE 10 MG PO TABS
10.0000 mg | ORAL_TABLET | Freq: Every day | ORAL | Status: DC
  Administered 2024-03-24: 10 mg via ORAL
  Filled 2024-03-24: qty 1

## 2024-03-24 MED ORDER — NIFEDIPINE 10 MG PO CAPS: 30.0000 mg | ORAL_CAPSULE | Freq: Two times a day (BID) | ORAL | Status: DC

## 2024-03-24 MED ORDER — CLONIDINE HCL 0.1 MG PO TABS
0.2000 mg | ORAL_TABLET | Freq: Three times a day (TID) | ORAL | Status: DC
  Administered 2024-03-24: 0.2 mg via ORAL
  Filled 2024-03-24: qty 2

## 2024-03-24 MED ORDER — POTASSIUM CHLORIDE CRYS ER 20 MEQ PO TBCR
20.0000 meq | EXTENDED_RELEASE_TABLET | Freq: Once | ORAL | Status: AC
  Administered 2024-03-24: 20 meq via ORAL
  Filled 2024-03-24: qty 1

## 2024-03-24 MED ORDER — ATORVASTATIN CALCIUM 40 MG PO TABS
40.0000 mg | ORAL_TABLET | Freq: Every day | ORAL | Status: DC
  Administered 2024-03-24 – 2024-03-25 (×2): 40 mg via ORAL
  Filled 2024-03-24 (×2): qty 1

## 2024-03-24 NOTE — Progress Notes (Addendum)
 "  NAME:  Bruce Harris, MRN:  [id number], DOB:  10-09-1970, LOS: 1 ADMISSION DATE:  03/23/2024, CONSULTATION DATE:  03/23/24 REFERRING MD:  Dr. Randol, CHIEF COMPLAINT:  HA/ HTN   History of Present Illness:   62 yoM with PMH of HTN, CKD IV, cardiomyopathy (EF 65-70% 01/2024), gout, HLD, anemia of CKD presenting to [facility] ER with complaints of right hand numbness, nausea, and headache after running out of his hypertension medications over the weekend.  Picked up his medications this morning and took them but given headache, presented to ER.  Denies tobacco or illicit drug abuse.  Denies any chest pain, back pain, abd pain, SOB, vision changes or focal neurological deficits.   In ER, SBP initially 210, HR 80, afebrile, normoxic.  Labs significant for BUN/ sCr 30/ 4.62 (previous sCr 5.5 in 01/2024), Hgb 12.2.  CTH neg.  UDS neg.  Labetalol  prn given then started on cardene  gtt.  Currently reports mild headache otherwise asymptomatic.  PCCM called for admit.   Pertinent  Medical History   Past Medical History:  Diagnosis Date   Cardiomyopathy    Chicken pox    Gout    Hyperglycemia    Hyperlipidemia    Hypertension    Hypertensive urgency 07/31/2022   Hypogonadism in male    OSA (obstructive sleep apnea) 05/19/2017   Toxic effect of venom of bees    Significant Hospital Events: Including procedures, antibiotic start and stop dates in addition to other pertinent events   1/20 admit HTN emergency on cardene   Interim History / Subjective:  Off cardene  after AM HTN meds Mild intermittent nausea but eating ok, otherwise denies symptoms Has been voiding in bathroom, not measured  Objective    Blood pressure (!) 170/103, pulse 68, temperature 98.2 F (36.8 C), temperature source Oral, resp. rate 13, height 5' 8 (1.727 m), weight 99 kg, SpO2 99%.        Intake/Output Summary (Last 24 hours) at 03/24/2024 1221 Last data filed at 03/24/2024 1131 Gross per 24 hour  Intake 590.29 ml  Output  --  Net 590.29 ml   Filed Weights   03/23/24 2200 03/24/24 0444  Weight: 99.1 kg 99 kg    Examination: General:  Adult male sitting in bed eating in NAD HEENT: MM pink/moist Neuro: Aox 4, non focal CV: rr, NSR, +2 radial pulses PULM:  non labored, clear, diminished bases, RA GI: soft, bs+, NT Extremities: warm/dry, no LE edema  Skin: no rashes  Labs> K 3.2, BUN/ sCr 30/ 4.32> 34/ 4.6   Resolved problem list   Assessment and Plan   HTN Emergency  - in setting of missing home meds for several days, ran out of meds.  Restarted 1/20 am per pt - awaiting home med rec view, pt not sure of home meds.    - UDS neg - CTH neg P:  - home meds of norvasc  10mg , clonidine  0.2 mg BID, and hydralazine  100mg  TID restarted this am - cardene  gtt weaned off but since SBP back up and developing headache and nausea.  Cont cardene  gtt for goal SBP 160.  Increase clonidine  to 0.3mg  TID  - zofran  prn> hold for QTC > 500.  Optimize electrolytes  - cont neuro monitoring - tylenol  prn headache   CKD IV Hypokalemia - due to hypertensive nephropathy, followed outpt by Dr. Dennise - previous sCr 01/21/24 sCr 5.5 and 10/08/23 of 3.36  P:  - stable renal indices, no severe electrolyte derangements, euvolemic -  kcl 20 meq x 1 - monitor UOP/ strict I/Os - renal diet with fluid restriction   Anemia --stable on admit, trend periodically   HLD - restart statin, LFTs ok  Labs   CBC: Recent Labs  Lab 03/23/24 1238  WBC 7.4  NEUTROABS 5.7  HGB 12.2*  HCT 38.8*  MCV 81.7  PLT 232    Basic Metabolic Panel: Recent Labs  Lab 03/23/24 1238 03/24/24 0324  NA 142 142  K 3.7 3.2*  CL 106 107  CO2 24 23  GLUCOSE 112* 125*  BUN 30* 34*  CREATININE 4.62* 4.60*  CALCIUM  9.7 8.9   GFR: Estimated Creatinine Clearance: 21.2 mL/min (A) (by C-G formula based on SCr of 4.6 mg/dL (H)). Recent Labs  Lab 03/23/24 1238  WBC 7.4    Liver Function Tests: Recent Labs  Lab 03/23/24 1238  AST  25  ALT 10  ALKPHOS 84  BILITOT 0.6  PROT 8.5*  ALBUMIN 4.4   No results for input(s): LIPASE, AMYLASE in the last 168 hours. No results for input(s): AMMONIA in the last 168 hours.  ABG No results found for: PHART, PCO2ART, PO2ART, HCO3, TCO2, ACIDBASEDEF, O2SAT   Coagulation Profile: Recent Labs  Lab 03/23/24 1238  INR 1.0    Cardiac Enzymes: No results for input(s): CKTOTAL, CKMB, CKMBINDEX, TROPONINI in the last 168 hours.  HbA1C: Hgb A1c MFr Bld  Date/Time Value Ref Range Status  03/12/2022 10:01 AM 6.3 4.6 - 6.5 % Final    Comment:    Glycemic Control Guidelines for People with Diabetes:Non Diabetic:  <6%Goal of Therapy: <7%Additional Action Suggested:  >8%   10/18/2016 10:10 AM 6.1 4.6 - 6.5 % Final    Comment:    Glycemic Control Guidelines for People with Diabetes:Non Diabetic:  <6%Goal of Therapy: <7%Additional Action Suggested:  >8%     CBG: Recent Labs  Lab 03/23/24 1237  GLUCAP 102*   Allergies Allergies[1]   Home Medications  Prior to Admission medications  Medication Sig Start Date End Date Taking? Authorizing Provider  aspirin  81 MG chewable tablet Chew 81 mg by mouth daily.   Yes [provider]  azelastine  (ASTELIN ) 0.1 % nasal spray Place 2 sprays into both nostrils 2 (two) times daily. Patient taking differently: Place 2 sprays into both nostrils daily as needed for rhinitis or allergies. 10/16/19  Yes Kennyth Worth HERO, MD  amLODipine  (NORVASC ) 10 MG tablet Take 1 tablet (10 mg total) by mouth daily. 02/06/24     amLODipine -valsartan  (EXFORGE ) 10-320 MG tablet Take 1 tablet by mouth daily. 12/22/23   Frann Mabel Mt, DO  atorvastatin  (LIPITOR) 40 MG tablet Take 40 mg by mouth daily.    [provider]  calcitRIOL  (ROCALTROL ) 0.25 MCG capsule Take 1 capsule (0.25 mcg total) by mouth 3 (three) times a week on Mon, Wed and Fri 02/06/24     carvedilol  (COREG ) 25 MG tablet Take 1 tablet (25 mg total)  by mouth 2 (two) times daily with a meal. 12/22/23   Wendling, Mabel Mt, DO  cloNIDine  (CATAPRES ) 0.1 MG tablet Take 1 tablet (0.1 mg total) by mouth 2 (two) times daily. 01/23/24     cloNIDine  (CATAPRES ) 0.2 MG tablet Take 1 tablet (0.2 mg total) by mouth 2 (two) times daily. 02/06/24     EPINEPHrine  (EPIPEN  2-PAK) 0.3 mg/0.3 mL IJ SOAJ injection Inject 0.3 mLs (0.3 mg total) into the muscle as needed for anaphylaxis. 09/01/19   Frann Mabel Mt, DO  hydrALAZINE  (APRESOLINE ) 100  MG tablet Take 1 tablet (100 mg total) by mouth 3 (three) times daily. 02/06/24     hydrALAZINE  (APRESOLINE ) 50 MG tablet Take 1 tablet (50 mg total) by mouth 3 (three) times daily. 08/03/22   Shalhoub, Zachary PARAS, MD  hydrochlorothiazide  (HYDRODIURIL ) 25 MG tablet Take 1 tablet (25 mg total) by mouth daily. 12/22/23   Frann Mabel Mt, DO  ondansetron  (ZOFRAN -ODT) 8 MG disintegrating tablet Place 1 tablet (8 mg total) onto the tongue every 8 (eight) hours as needed for nausea and vomiting. 01/26/24     rosuvastatin  (CRESTOR ) 20 MG tablet Take 1 tablet (20 mg total) by mouth daily. 12/22/23   Frann Mabel Mt, DO  spironolactone  (ALDACTONE ) 25 MG tablet Take 1 tablet (25 mg total) by mouth daily. 12/22/23   Frann Mabel Mt, DO         CRITICAL CARE Performed by: Lyle Pesa   Total critical care time: 35 minutes  Critical care time was exclusive of separately billable procedures and treating other patients.  Critical care was necessary to treat or prevent imminent or life-threatening deterioration.  Critical care was time spent personally by me on the following activities: development of treatment plan with patient and/or surrogate as well as nursing, discussions with consultants, evaluation of patient's response to treatment, examination of patient, obtaining history from patient or surrogate, ordering and performing treatments and interventions, ordering and review of laboratory  studies, ordering and review of radiographic studies, pulse oximetry and re-evaluation of patient's condition.    Lyle Pesa, NP [facility] 03/24/2024, 12:21 PM  See Amion for pager If no response to pager , please call [phone] until 7pm After 7:00 pm call Elink  [phone]            [1]  Allergies Allergen Reactions   Bee Venom Anaphylaxis and Swelling   "

## 2024-03-24 NOTE — Plan of Care (Signed)

## 2024-03-25 DIAGNOSIS — I161 Hypertensive emergency: Secondary | ICD-10-CM | POA: Diagnosis not present

## 2024-03-25 LAB — BASIC METABOLIC PANEL WITH GFR
Anion gap: 12 (ref 5–15)
BUN: 34 mg/dL — ABNORMAL HIGH (ref 6–20)
CO2: 22 mmol/L (ref 22–32)
Calcium: 9.3 mg/dL (ref 8.9–10.3)
Chloride: 108 mmol/L (ref 98–111)
Creatinine, Ser: 4.5 mg/dL — ABNORMAL HIGH (ref 0.61–1.24)
GFR, Estimated: 15 mL/min — ABNORMAL LOW
Glucose, Bld: 110 mg/dL — ABNORMAL HIGH (ref 70–99)
Potassium: 3.7 mmol/L (ref 3.5–5.1)
Sodium: 142 mmol/L (ref 135–145)

## 2024-03-25 LAB — GLUCOSE, CAPILLARY: Glucose-Capillary: 138 mg/dL — ABNORMAL HIGH (ref 70–99)

## 2024-03-25 MED ORDER — DOXAZOSIN MESYLATE 1 MG PO TABS
1.0000 mg | ORAL_TABLET | Freq: Every day | ORAL | Status: DC
  Administered 2024-03-25 – 2024-03-26 (×2): 1 mg via ORAL
  Filled 2024-03-25 (×2): qty 1

## 2024-03-25 MED ORDER — DIPHENHYDRAMINE HCL 50 MG/ML IJ SOLN
12.5000 mg | Freq: Four times a day (QID) | INTRAMUSCULAR | Status: DC | PRN
  Administered 2024-03-25: 12.5 mg via INTRAVENOUS
  Filled 2024-03-25: qty 1

## 2024-03-25 MED ORDER — ONDANSETRON HCL 4 MG/2ML IJ SOLN
4.0000 mg | Freq: Four times a day (QID) | INTRAMUSCULAR | Status: DC | PRN
  Administered 2024-03-25: 4 mg via INTRAVENOUS
  Filled 2024-03-25: qty 2

## 2024-03-25 MED ORDER — HYDRALAZINE HCL 20 MG/ML IJ SOLN
10.0000 mg | INTRAMUSCULAR | Status: DC | PRN
  Administered 2024-03-25: 10 mg via INTRAVENOUS
  Filled 2024-03-25: qty 1

## 2024-03-25 MED ORDER — FAMOTIDINE IN NACL 20-0.9 MG/50ML-% IV SOLN
20.0000 mg | Freq: Two times a day (BID) | INTRAVENOUS | Status: DC
  Administered 2024-03-25 – 2024-03-26 (×3): 20 mg via INTRAVENOUS
  Filled 2024-03-25 (×3): qty 50

## 2024-03-25 MED ORDER — ASPIRIN 81 MG PO CHEW
81.0000 mg | CHEWABLE_TABLET | Freq: Every day | ORAL | Status: DC
  Administered 2024-03-25 – 2024-03-26 (×2): 81 mg via ORAL
  Filled 2024-03-25 (×2): qty 1

## 2024-03-25 MED ORDER — DEXAMETHASONE SODIUM PHOSPHATE 4 MG/ML IJ SOLN
4.0000 mg | Freq: Four times a day (QID) | INTRAMUSCULAR | Status: AC
  Administered 2024-03-25 – 2024-03-26 (×4): 4 mg via INTRAVENOUS
  Filled 2024-03-25 (×4): qty 1

## 2024-03-25 NOTE — Progress Notes (Signed)
" °   03/25/24 1549  TOC Brief Assessment  Insurance and Status Reviewed  Patient has primary care physician Yes  Home environment has been reviewed Apartment  Prior level of function: Independent with ADL's  Prior/Current Home Services No current home services  Social Drivers of Health Review SDOH reviewed no interventions necessary  Readmission risk has been reviewed Yes  Transition of care needs no transition of care needs at this time    "

## 2024-03-25 NOTE — Evaluation (Signed)
 Clinical/Bedside Swallow Evaluation Patient Details  Name: Bruce Harris MRN: [id number] Date of Birth: Sep 03, 1970  Today's Date: 03/25/2024 Time: SLP Start Time (ACUTE ONLY): 1227 SLP Stop Time (ACUTE ONLY): 1237 SLP Time Calculation (min) (ACUTE ONLY): 10 min  Past Medical History:  Past Medical History:  Diagnosis Date   Cardiomyopathy    Chicken pox    Gout    Hyperglycemia    Hyperlipidemia    Hypertension    Hypertensive urgency 07/31/2022   Hypogonadism in male    OSA (obstructive sleep apnea) 05/19/2017   Toxic effect of venom of bees    Past Surgical History:  Past Surgical History:  Procedure Laterality Date   HAND SURGERY     Right   HPI:  54 yo male presenting to ED 1/20 with R hand numbness, nausea, and headache after running out of his HTN meds over the w/e. SLP consulted after suspected allergic reaction to medication with tongue swelling. PMH: HTN, CKD IV, cardiomyopathy, gout, HLD, anemia of CKD    Assessment / Plan / Recommendation  Clinical Impression  Pt reports oral edema has resided. Oral motor function appears WFL. He fed himself regular solids with prompt oral transit and complete clearance. RN present to provide pills, which he took with thin liquids without signs clinically concerning for aspiration. Continue current diet without ongoing SLP f/u. SLP Visit Diagnosis: Dysphagia, unspecified (R13.10)    Aspiration Risk  No limitations    Diet Recommendation           Other Recommendations Oral Care Recommendations: Oral care BID     Swallow Evaluation Recommendations Recommendations: PO diet PO Diet Recommendation: Regular;Thin liquids (Level 0) Liquid Administration via: Cup;Straw Medication Administration: Whole meds with liquid Supervision: Patient able to self-feed Swallowing strategies  : Slow rate;Small bites/sips Postural changes: Position pt fully upright for meals Oral care recommendations: Oral care BID (2x/day)   Assistance  Recommended at Discharge    Functional Status Assessment Patient has not had a recent decline in their functional status  Frequency and Duration            Prognosis Prognosis for improved oropharyngeal function: Good      Swallow Study   General HPI: 54 yo male presenting to ED 1/20 with R hand numbness, nausea, and headache after running out of his HTN meds over the w/e. SLP consulted after suspected allergic reaction to medication with tongue swelling. PMH: HTN, CKD IV, cardiomyopathy, gout, HLD, anemia of CKD Type of Study: Bedside Swallow Evaluation Previous Swallow Assessment: none in chart Diet Prior to this Study: Regular;Thin liquids (Level 0) Temperature Spikes Noted: No Respiratory Status: Room air History of Recent Intubation: No Behavior/Cognition: Alert;Cooperative;Pleasant mood Oral Cavity Assessment: Within Functional Limits Oral Care Completed by SLP: No Oral Cavity - Dentition: Adequate natural dentition Vision: Functional for self-feeding Self-Feeding Abilities: Able to feed self Patient Positioning: Upright in bed Baseline Vocal Quality: Normal Volitional Cough: Strong Volitional Swallow: Able to elicit    Oral/Motor/Sensory Function Overall Oral Motor/Sensory Function: Within functional limits   Ice Chips Ice chips: Not tested   Thin Liquid Thin Liquid: Within functional limits Presentation: Straw;Self Fed    Nectar Thick Nectar Thick Liquid: Not tested   Honey Thick Honey Thick Liquid: Not tested   Puree Puree: Not tested   Solid     Solid: Within functional limits Presentation: Self Fed      Damien Blumenthal, M.A., CCC-SLP Speech Language Pathology, Acute Rehabilitation Services  Secure Chat preferred  [phone]  03/25/2024,12:44 PM

## 2024-03-25 NOTE — Progress Notes (Signed)
 eLink Physician-Brief Progress Note Patient Name: Bruce Harris DOB: [birth year] MRN: [id number]   Date of Service  03/25/2024  HPI/Events of Note  Patient states he's having an allergic reaction to Catapres  with lip and tongue swelling, and a sensation of difficulty swallowing, states symptoms are similar to a previous allergic reaction.  eICU Interventions  Benadryl  + Pepcid   + Decadron . NPO. Close monitoring in ICU with prompt intervention if there are any signs of evolving airway difficulty.         Cecely Rengel U Neida Ellegood 03/25/2024, 3:41 AM

## 2024-03-25 NOTE — Progress Notes (Signed)
 " PROGRESS NOTE    Bruce Harris  FMW:[id number] DOB: 1971-02-26 DOA: 03/23/2024 PCP: Frann Mabel Mt, DO     Brief Narrative:  Bruce Harris is a 54 yo M with PMH of HTN, CKD IV, cardiomyopathy (EF 65-70% 01/2024), gout, HLD, anemia of CKD presenting to [facility] ER with complaints of right hand numbness, nausea, and headache after running out of his hypertension medications over the weekend.  Picked up his medications this morning and took them but given headache, presented to ER.  Denies tobacco or illicit drug abuse.  Denies any chest pain, back pain, abd pain, SOB, vision changes or focal neurological deficits.   In ER, SBP initially 210, HR 80, afebrile, normoxic.  Labs significant for BUN/ sCr 30/ 4.62 (previous sCr 5.5 in 01/2024), Hgb 12.2.  CTH neg.  UDS neg.  Labetalol  prn given then started on cardene  gtt.  Currently reports mild headache otherwise asymptomatic.  PCCM called for admit.   New events last 24 hours / Subjective: Patient states that last night, he took Catapres .  Started to notice that the back of his throat felt like it was closing, then progressed to tongue swelling.  He received Benadryl  and Pepcid  and Decadron .  This morning, feeling better.  Still having some swelling but better since last night.  He states that he actually had similar reaction at home after taking Catapres , which led to him discontinuing Catapres  at home.  Assessment & Plan:   Principal Problem:   Hypertensive emergency Active Problems:   Hyperlipidemia   Chronic kidney disease, stage 4 (severe) (HCC)   Hypertensive emergency  - In setting of missing medications at home - PTA med list includes Norvasc  10 mg, Coreg  25 mg twice daily, hydralazine  100 mg 3 times daily - Patient was admitted to ICU, started on Cardene  drip - Overnight, had an allergic reaction to Catapres .  This medication has now been added to allergies list - Continue Norvasc , Coreg , hydralazine .  Added doxazosin   today  CKD stage IV - Due to hypertensive nephropathy, followed by Dr. Dennise outpatient - Baseline creatinine of 3.5-5.5 - Need close follow-up - Remains stable today   Hyperlipidemia - Lipitor   DVT prophylaxis:  heparin  injection 5,000 Units Start: 03/23/24 2200 SCDs Start: 03/23/24 1812  Code Status: Full code Family Communication: None at bedside Disposition Plan: Home Status is: Inpatient Remains inpatient appropriate because: Continue to monitor for allergic reaction, monitor blood pressure.  Hopeful discharge home 1/23    Antimicrobials:  Anti-infectives (From admission, onward)    None        Objective: Vitals:   03/25/24 0900 03/25/24 1000 03/25/24 1025 03/25/24 1100  BP: (!) 160/71 137/68 137/68 (!) 151/93  Pulse: 74 74  72  Resp: (!) 22 15  (!) 23  Temp:      TempSrc:      SpO2: 97% 96%  97%  Weight:      Height:        Intake/Output Summary (Last 24 hours) at 03/25/2024 1212 Last data filed at 03/25/2024 0854 Gross per 24 hour  Intake 867.01 ml  Output 650 ml  Net 217.01 ml   Filed Weights   03/23/24 2200 03/24/24 0444 03/25/24 0413  Weight: 99.1 kg 99 kg 99.3 kg    Examination:  General exam: Appears calm and comfortable  Respiratory system: Clear to auscultation. Respiratory effort normal. No respiratory distress. No conversational dyspnea.  Cardiovascular system: S1 & S2 heard, RRR. No murmurs. No pedal  edema. Gastrointestinal system: Abdomen is nondistended, soft and nontender. Normal bowel sounds heard. Central nervous system: Alert and oriented. No focal neurological deficits. Speech clear.  Extremities: Symmetric in appearance  Skin: No rashes, lesions or ulcers on exposed skin  Psychiatry: Judgement and insight appear normal. Mood & affect appropriate.   Data Reviewed: I have personally reviewed following labs and imaging studies  CBC: Recent Labs  Lab 03/23/24 1238  WBC 7.4  NEUTROABS 5.7  HGB 12.2*  HCT 38.8*  MCV 81.7   PLT 232   Basic Metabolic Panel: Recent Labs  Lab 03/23/24 1238 03/24/24 0324 03/25/24 0339  NA 142 142 142  K 3.7 3.2* 3.7  CL 106 107 108  CO2 24 23 22   GLUCOSE 112* 125* 110*  BUN 30* 34* 34*  CREATININE 4.62* 4.60* 4.50*  CALCIUM  9.7 8.9 9.3   GFR: Estimated Creatinine Clearance: 21.7 mL/min (A) (by C-G formula based on SCr of 4.5 mg/dL (H)). Liver Function Tests: Recent Labs  Lab 03/23/24 1238  AST 25  ALT 10  ALKPHOS 84  BILITOT 0.6  PROT 8.5*  ALBUMIN 4.4   No results for input(s): LIPASE, AMYLASE in the last 168 hours. No results for input(s): AMMONIA in the last 168 hours. Coagulation Profile: Recent Labs  Lab 03/23/24 1238  INR 1.0   Cardiac Enzymes: No results for input(s): CKTOTAL, CKMB, CKMBINDEX, TROPONINI in the last 168 hours. BNP (last 3 results) No results for input(s): PROBNP in the last 8760 hours. HbA1C: No results for input(s): HGBA1C in the last 72 hours. CBG: Recent Labs  Lab 03/23/24 1237  GLUCAP 102*   Lipid Profile: No results for input(s): CHOL, HDL, LDLCALC, TRIG, CHOLHDL, LDLDIRECT in the last 72 hours. Thyroid  Function Tests: No results for input(s): TSH, T4TOTAL, FREET4, T3FREE, THYROIDAB in the last 72 hours. Anemia Panel: No results for input(s): VITAMINB12, FOLATE, FERRITIN, TIBC, IRON, RETICCTPCT in the last 72 hours. Sepsis Labs: No results for input(s): PROCALCITON, LATICACIDVEN in the last 168 hours.  Recent Results (from the past 240 hours)  MRSA Next Gen by PCR, Nasal     Status: None   Collection Time: 03/23/24  7:19 PM   Specimen: Nasal Mucosa; Nasal Swab  Result Value Ref Range Status   MRSA by PCR Next Gen NOT DETECTED NOT DETECTED Final    Comment: (NOTE) The GeneXpert MRSA Assay (FDA approved for NASAL specimens only), is one component of a comprehensive MRSA colonization surveillance program. It is not intended to diagnose MRSA infection nor to  guide or monitor treatment for MRSA infections. Test performance is not FDA approved in patients less than 40 years old. Performed at [facility], [address] [place], [place] [address]       Radiology Studies: CT HEAD WO CONTRAST Result Date: 03/23/2024 EXAM: CT HEAD WITHOUT CONTRAST 03/23/2024 12:16:05 PM TECHNIQUE: CT of the head was performed without the administration of intravenous contrast. Automated exposure control, iterative reconstruction, and/or weight based adjustment of the mA/kV was utilized to reduce the radiation dose to as low as reasonably achievable. COMPARISON: 07/31/2022 CLINICAL HISTORY: Neuro deficit, acute, stroke suspected. FINDINGS: BRAIN AND VENTRICLES: No acute hemorrhage. 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 There is low attenuation in the left thalamus which is likely artifactual. Mild chronic microvascular ischemic changes. A few areas of streak artifact slightly limited evaluation. No hydrocephalus. No extra-axial collection. No mass effect or midline shift. ORBITS: Chronic  deformity of left lamina papyracea. SINUSES: No acute abnormality. SOFT TISSUES AND SKULL: No acute soft tissue abnormality. No skull fracture. IMPRESSION: 1. No acute findings. 2. 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 Electronically signed by: Donnice Mania MD 03/23/2024 12:49 PM EST RP Workstation: [id number]      Scheduled Meds:  amLODipine   10 mg Oral Daily   aspirin   81 mg Oral Daily   atorvastatin   40 mg Oral QHS   carvedilol   25 mg Oral BID WC   Chlorhexidine  Gluconate Cloth  6 each Topical Daily   dexamethasone  (DECADRON ) injection  4 mg Intravenous Q6H   doxazosin   1 mg Oral Daily   heparin   5,000 Units Subcutaneous Q8H   hydrALAZINE   100 mg Oral Q8H   Continuous Infusions:  famotidine  (PEPCID ) IV Stopped (03/25/24 1114)      LOS: 2 days   Time spent: 30 minutes   Delon Hoe, DO [facility] 03/25/2024, 12:12 PM   Available via Epic secure chat 7am-7pm After these hours, please refer to coverage provider listed on [email or web address]  "

## 2024-03-26 ENCOUNTER — Other Ambulatory Visit (HOSPITAL_COMMUNITY): Payer: Self-pay

## 2024-03-26 DIAGNOSIS — I161 Hypertensive emergency: Secondary | ICD-10-CM | POA: Diagnosis not present

## 2024-03-26 MED ORDER — DOXAZOSIN MESYLATE 1 MG PO TABS
1.0000 mg | ORAL_TABLET | Freq: Every day | ORAL | 0 refills | Status: AC
  Filled 2024-03-26: qty 30, 30d supply, fill #0

## 2024-03-26 MED ORDER — NEPRO/CARBSTEADY PO LIQD
237.0000 mL | Freq: Two times a day (BID) | ORAL | Status: DC
  Administered 2024-03-26: 237 mL via ORAL
  Filled 2024-03-26 (×2): qty 237

## 2024-03-26 NOTE — Care Management Important Message (Signed)
 Important Message  Patient Details IM Letter given. Name: Bruce Harris MRN: [id number] Date of Birth: [birth year]   Important Message Given:  Yes - Medicare IM     [name] 03/26/2024, 10:42 AM

## 2024-03-26 NOTE — Discharge Summary (Signed)
 Physician Discharge Summary  Bruce Harris FMW:[id number] DOB: 09/06/1970 DOA: 03/23/2024  PCP: Frann Mabel Mt, DO  Admit date: 03/23/2024 Discharge date: 03/26/2024  Admitted From: Home Disposition:  Home  Recommendations for Outpatient Follow-up:  Follow up with PCP in 1 week Follow up with Nephrology as scheduled next week Dr. Ephriam Stank Repeat BMP in 1 week to monitor Cr/GFR   Discharge Condition: Stable CODE STATUS: Full  Diet recommendation: Heart healthy / Renal   Brief/Interim Summary: Bruce Harris is a 54 yo M with PMH of HTN, CKD IV, cardiomyopathy (EF 65-70% 01/2024), gout, HLD, anemia of CKD presenting to [facility] ER with complaints of right hand numbness, nausea, and headache after running out of his hypertension medications over the weekend.  Picked up his medications this morning and took them but given headache, presented to ER.  Denies tobacco or illicit drug abuse.  Denies any chest pain, back pain, abd pain, SOB, vision changes or focal neurological deficits.    In ER, SBP initially 210, HR 80, afebrile, normoxic.  Labs significant for BUN/ sCr 30/ 4.62 (previous sCr 5.5 in 01/2024), Hgb 12.2.  CTH neg.  UDS neg.  Labetalol  prn given then started on cardene  gtt.  Currently reports mild headache otherwise asymptomatic.  PCCM called for admit.   Patient's BP improved and he was placed on oral Norvasc , coreg , hydralazine . He had an allergic reaction to Catapres  and developed throat swelling and tongue swelling. This resolved with benadryl , pepcid , decadron . He was started on cardura . He was discharged home in stable and improved condition.   Discharge Diagnoses:   Principal Problem:   Hypertensive emergency Active Problems:   Hyperlipidemia   Chronic kidney disease, stage 4 (severe) (HCC)     Hypertensive emergency  - In setting of missing medications at home - PTA med list includes Norvasc  10 mg, Coreg  25 mg twice daily, hydralazine  100 mg 3 times  daily - Patient was admitted to ICU, started on Cardene  drip - Had an allergic reaction to Catapres .  This medication has now been added to allergies list - Continue Norvasc , Coreg , hydralazine .  Added doxazosin     CKD stage IV-V - Due to hypertensive nephropathy, followed by Dr. Stank outpatient - Baseline creatinine of 3.5-5.5 - Need close follow-up - Remains stable    Hyperlipidemia - Lipitor  Discharge Instructions  Discharge Instructions     Call MD for:  difficulty breathing, headache or visual disturbances   Complete by: As directed    Call MD for:  extreme fatigue   Complete by: As directed    Call MD for:  hives   Complete by: As directed    Call MD for:  persistant dizziness or light-headedness   Complete by: As directed    Call MD for:  persistant nausea and vomiting   Complete by: As directed    Call MD for:  severe uncontrolled pain   Complete by: As directed    Call MD for:  temperature >100.4   Complete by: As directed    Discharge instructions   Complete by: As directed    You were cared for by a hospitalist during your hospital stay. If you have any questions about your discharge medications or the care you received while you were in the hospital after you are discharged, you can call the unit and ask to speak with the hospitalist on call if the hospitalist that took care of you is not available. Once you are discharged, your primary  care physician will handle any further medical issues. Please note that NO REFILLS for any discharge medications will be authorized once you are discharged, as it is imperative that you return to your primary care physician (or establish a relationship with a primary care physician if you do not have one) for your aftercare needs so that they can reassess your need for medications and monitor your lab values.   Increase activity slowly   Complete by: As directed       Allergies as of 03/26/2024       Reactions   Bee Venom  Anaphylaxis, Swelling   Catapres -tts [clonidine ] Swelling   Throat swelling, tongue swelling         Medication List     TAKE these medications    amLODipine  10 MG tablet Commonly known as: NORVASC  Take 1 tablet (10 mg total) by mouth daily.   aspirin  81 MG chewable tablet Chew 81 mg by mouth daily.   atorvastatin  40 MG tablet Commonly known as: LIPITOR Take 40 mg by mouth at bedtime.   azelastine  0.1 % nasal spray Commonly known as: ASTELIN  Place 2 sprays into both nostrils 2 (two) times daily. What changed:  when to take this reasons to take this   carvedilol  25 MG tablet Commonly known as: COREG  Take 1 tablet (25 mg total) by mouth 2 (two) times daily with a meal.   doxazosin  1 MG tablet Commonly known as: CARDURA  Take 1 tablet (1 mg total) by mouth daily.   EPINEPHrine  0.3 mg/0.3 mL Soaj injection Commonly known as: EpiPen  2-Pak Inject 0.3 mLs (0.3 mg total) into the muscle as needed for anaphylaxis.   hydrALAZINE  100 MG tablet Commonly known as: APRESOLINE  Take 1 tablet (100 mg total) by mouth 3 (three) times daily.   ondansetron  8 MG disintegrating tablet Commonly known as: ZOFRAN -ODT Place 1 tablet (8 mg total) onto the tongue every 8 (eight) hours as needed for nausea and vomiting. What changed: reasons to take this        Follow-up Information     Frann Mabel Mt, DO Follow up.   Specialty: Family Medicine Contact information: [address] [place] [address] [phone]         Dennise Hoes, MD Follow up.   Specialty: Nephrology Contact information: [address] [place] [address] [phone]                Allergies[1]   Procedures/Studies: CT HEAD WO CONTRAST Result Date: 03/23/2024 EXAM: CT HEAD WITHOUT CONTRAST 03/23/2024 12:16:05 PM TECHNIQUE: CT of the head was performed without the administration of intravenous contrast. Automated exposure control, iterative reconstruction, and/or  weight based adjustment of the mA/kV was utilized to reduce the radiation dose to as low as reasonably achievable. COMPARISON: 07/31/2022 CLINICAL HISTORY: Neuro deficit, acute, stroke suspected. FINDINGS: BRAIN AND VENTRICLES: No acute hemorrhage. 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 There is low attenuation in the left thalamus which is likely artifactual. Mild chronic microvascular ischemic changes. A few areas of streak artifact slightly limited evaluation. No hydrocephalus. No extra-axial collection. No mass effect or midline shift. ORBITS: Chronic deformity of left lamina papyracea. SINUSES: No acute abnormality. SOFT TISSUES AND SKULL: No acute soft tissue abnormality. No skull fracture. IMPRESSION: 1. No acute findings. 2. 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 Electronically signed by: Donnice Mania MD  03/23/2024 12:49 PM EST RP Workstation: [id number]      Discharge Exam: Vitals:   03/26/24 0506 03/26/24 0757  BP: (!) 150/100 (!) 159/101  Pulse: 73 73  Resp: 16   Temp: (!) 97.5 F (36.4 C)   SpO2: 98%     General: Pt is alert, awake, not in acute distress Cardiovascular: RRR, S1/S2 +, no edema Respiratory: CTA bilaterally, no wheezing, no rhonchi, no respiratory distress, no conversational dyspnea  Abdominal: Soft, NT, ND, bowel sounds + Extremities: no edema, no cyanosis Psych: Normal mood and affect, stable judgement and insight     The results of significant diagnostics from this hospitalization (including imaging, microbiology, ancillary and laboratory) are listed below for reference.     Microbiology: Recent Results (from the past 240 hours)  MRSA Next Gen by PCR, Nasal     Status: None   Collection Time: 03/23/24  7:19 PM   Specimen: Nasal Mucosa; Nasal Swab  Result Value Ref Range Status   MRSA by PCR Next Gen NOT DETECTED NOT DETECTED Final     Comment: (NOTE) The GeneXpert MRSA Assay (FDA approved for NASAL specimens only), is one component of a comprehensive MRSA colonization surveillance program. It is not intended to diagnose MRSA infection nor to guide or monitor treatment for MRSA infections. Test performance is not FDA approved in patients less than 37 years old. Performed at [facility], [address], [place], [place] [address]      Labs: BNP (last 3 results) No results for input(s): BNP in the last 8760 hours. Basic Metabolic Panel: Recent Labs  Lab 03/23/24 1238 03/24/24 0324 03/25/24 0339  NA 142 142 142  K 3.7 3.2* 3.7  CL 106 107 108  CO2 24 23 22   GLUCOSE 112* 125* 110*  BUN 30* 34* 34*  CREATININE 4.62* 4.60* 4.50*  CALCIUM  9.7 8.9 9.3   Liver Function Tests: Recent Labs  Lab 03/23/24 1238  AST 25  ALT 10  ALKPHOS 84  BILITOT 0.6  PROT 8.5*  ALBUMIN 4.4   No results for input(s): LIPASE, AMYLASE in the last 168 hours. No results for input(s): AMMONIA in the last 168 hours. CBC: Recent Labs  Lab 03/23/24 1238  WBC 7.4  NEUTROABS 5.7  HGB 12.2*  HCT 38.8*  MCV 81.7  PLT 232   Cardiac Enzymes: No results for input(s): CKTOTAL, CKMB, CKMBINDEX, TROPONINI in the last 168 hours. BNP: Invalid input(s): POCBNP CBG: Recent Labs  Lab 03/23/24 1237 03/25/24 1216  GLUCAP 102* 138*   D-Dimer No results for input(s): DDIMER in the last 72 hours. Hgb A1c No results for input(s): HGBA1C in the last 72 hours. Lipid Profile No results for input(s): CHOL, HDL, LDLCALC, TRIG, CHOLHDL, LDLDIRECT in the last 72 hours. Thyroid  function studies No results for input(s): TSH, T4TOTAL, T3FREE, THYROIDAB in the last 72 hours.  Invalid input(s): FREET3 Anemia work up No results for input(s): VITAMINB12, FOLATE, FERRITIN, TIBC, IRON, RETICCTPCT in the last 72 hours. Urinalysis    Component Value Date/Time    COLORURINE YELLOW 01/12/2024 0750   APPEARANCEUR CLEAR 01/12/2024 0750   LABSPEC 1.013 01/12/2024 0750   PHURINE 6.5 01/12/2024 0750   GLUCOSEU NEGATIVE 01/12/2024 0750   GLUCOSEU NEGATIVE 10/18/2016 1010   HGBUR TRACE (A) 01/12/2024 0750   BILIRUBINUR NEGATIVE 08/02/2022 1100   KETONESUR NEGATIVE 01/12/2024 0750   PROTEINUR 3+ (A) 01/12/2024 0750   UROBILINOGEN 0.2 10/18/2016 1010   NITRITE NEGATIVE 08/02/2022 1100   LEUKOCYTESUR NEGATIVE  08/02/2022 1100   Sepsis Labs Recent Labs  Lab 03/23/24 1238  WBC 7.4   Microbiology Recent Results (from the past 240 hours)  MRSA Next Gen by PCR, Nasal     Status: None   Collection Time: 03/23/24  7:19 PM   Specimen: Nasal Mucosa; Nasal Swab  Result Value Ref Range Status   MRSA by PCR Next Gen NOT DETECTED NOT DETECTED Final    Comment: (NOTE) The GeneXpert MRSA Assay (FDA approved for NASAL specimens only), is one component of a comprehensive MRSA colonization surveillance program. It is not intended to diagnose MRSA infection nor to guide or monitor treatment for MRSA infections. Test performance is not FDA approved in patients less than 88 years old. Performed at [facility], [address] [place], [place] [address]      Patient was seen and examined on the day of discharge and was found to be in stable condition. Time coordinating discharge: 25 minutes including assessment and coordination of care, as well as examination of the patient.   SIGNED:  Delon Hoe, DO [facility] 03/26/2024, 9:49 AM       [1]  Allergies Allergen Reactions   Bee Venom Anaphylaxis and Swelling   Catapres -Tts [Clonidine ] Swelling    Throat swelling, tongue swelling

## 2024-03-26 NOTE — Plan of Care (Signed)
   Problem: Education: Goal: Knowledge of General Education information will improve Description Including pain rating scale, medication(s)/side effects and non-pharmacologic comfort measures Outcome: Progressing   Problem: Health Behavior/Discharge Planning: Goal: Ability to manage health-related needs will improve Outcome: Progressing

## 2024-03-29 ENCOUNTER — Other Ambulatory Visit (HOSPITAL_COMMUNITY): Payer: Self-pay

## 2024-03-29 ENCOUNTER — Telehealth: Payer: Self-pay

## 2024-03-29 NOTE — Transitions of Care (Post Inpatient/ED Visit) (Signed)
 "  03/29/2024  Name: Bruce Harris MRN: [id number] DOB: 04-Jan-1971  Today's TOC FU Call Status: Today's TOC FU Call Status:: Successful TOC FU Call Completed TOC FU Call Complete Date: 03/29/24  Patient's Name and Date of Birth confirmed. Name, DOB  Transition Care Management Follow-up Telephone Call Date of Discharge: 03/29/24 Discharge Facility: [facility]) Type of Discharge: Inpatient Admission Primary Inpatient Discharge Diagnosis:: Hypertensive Emergency How have you been since you were released from the hospital?: Better Any questions or concerns?: No  Items Reviewed: Did you receive and understand the discharge instructions provided?: Yes Medications obtained,verified, and reconciled?: Yes (Medications Reviewed) Any new allergies since your discharge?: No Dietary orders reviewed?: Yes Type of Diet Ordered:: Low Sodium Heart Healthy Do you have support at home?: Yes People in Home [RPT]: significant other Name of Support/Comfort Primary Source: Lucretia Long  Medications Reviewed Today: Medications Reviewed Today     Reviewed by Moises Reusing, RN (Case Manager) on 03/29/24 at 1013  Med List Status: <None>   Medication Order Taking? Sig Documenting Provider Last Dose Status Informant  amLODipine  (NORVASC ) 10 MG tablet [id number] No Take 1 tablet (10 mg total) by mouth daily.  03/19/2024 Active Self  aspirin  81 MG chewable tablet [id number] No Chew 81 mg by mouth daily. [provider] 03/23/2024  7:30 AM Active Self  atorvastatin  (LIPITOR) 40 MG tablet [id number] No Take 40 mg by mouth at bedtime. [provider] 03/19/2024 Active Self  azelastine  (ASTELIN ) 0.1 % nasal spray [id number] No Place 2 sprays into both nostrils 2 (two) times daily.  Patient taking differently: Place 2 sprays into both nostrils daily as needed for rhinitis or allergies.   Kennyth Worth HERO, MD Unknown Active Self  carvedilol  (COREG ) 25 MG tablet [id number] No Take 1 tablet  (25 mg total) by mouth 2 (two) times daily with a meal. Frann Mabel Mt, DO 03/23/2024  7:00 AM Active Self  doxazosin  (CARDURA ) 1 MG tablet [id number]  Take 1 tablet (1 mg total) by mouth daily. Rojelio Nest, DO  Active   EPINEPHrine  (EPIPEN  2-PAK) 0.3 mg/0.3 mL IJ SOAJ injection [id number] No Inject 0.3 mLs (0.3 mg total) into the muscle as needed for anaphylaxis. Frann Mabel Mt, DO Unknown Active Self  hydrALAZINE  (APRESOLINE ) 100 MG tablet [id number] No Take 1 tablet (100 mg total) by mouth 3 (three) times daily.  03/23/2024 Morning Active Self  ondansetron  (ZOFRAN -ODT) 8 MG disintegrating tablet [id number] No Place 1 tablet (8 mg total) onto the tongue every 8 (eight) hours as needed for nausea and vomiting.  Patient taking differently: Take 8 mg by mouth every 8 (eight) hours as needed for nausea or vomiting (dissolve orally).    Unknown Active Self            Home Care and Equipment/Supplies: Were Home Health Services Ordered?: NA Any new equipment or medical supplies ordered?: NA  Functional Questionnaire: Do you need assistance with bathing/showering or dressing?: No Do you need assistance with meal preparation?: No Do you need assistance with eating?: No Do you have difficulty maintaining continence: No Do you need assistance with getting out of bed/getting out of a chair/moving?: No Do you have difficulty managing or taking your medications?: No  Follow up appointments reviewed: PCP Follow-up appointment confirmed?: Yes Date of PCP follow-up appointment?: 04/06/24 Follow-up Provider: [facility] Follow-up appointment confirmed?: NA Do you need transportation to your follow-up appointment?: No Do you understand care options if your condition(s) worsen?: Yes-patient verbalized understanding  SDOH Interventions Today    Flowsheet Row Most Recent Value  SDOH Interventions   Food Insecurity Interventions Intervention Not Indicated   Housing Interventions Intervention Not Indicated  Transportation Interventions Intervention Not Indicated  Utilities Interventions Intervention Not Indicated    Medford Balboa, BSN, RN [place]  VBCI - Population Health RN Care Manager [phone]  "

## 2024-03-31 ENCOUNTER — Other Ambulatory Visit (HOSPITAL_COMMUNITY): Payer: Self-pay

## 2024-03-31 MED ORDER — CARVEDILOL 12.5 MG PO TABS
12.5000 mg | ORAL_TABLET | Freq: Two times a day (BID) | ORAL | 2 refills | Status: AC
  Filled 2024-03-31 (×2): qty 60, 30d supply, fill #0

## 2024-04-06 ENCOUNTER — Inpatient Hospital Stay: Admitting: Family Medicine

## 2024-04-07 ENCOUNTER — Other Ambulatory Visit (HOSPITAL_COMMUNITY): Payer: Self-pay

## 2024-04-07 MED ORDER — HYDRALAZINE HCL 100 MG PO TABS
100.0000 mg | ORAL_TABLET | Freq: Three times a day (TID) | ORAL | 5 refills | Status: AC
  Filled 2024-04-07: qty 90, 30d supply, fill #0

## 2024-06-23 ENCOUNTER — Encounter: Admitting: Family Medicine
